# Patient Record
Sex: Female | Born: 1960 | Race: White | Hispanic: Yes | Marital: Married | Smoking: Former smoker
Health system: Southern US, Community
[De-identification: ages and names within clinical notes are randomized; demographics above are authoritative.]

## PROBLEM LIST (undated history)

## (undated) DIAGNOSIS — IMO0001 Reserved for inherently not codable concepts without codable children: Secondary | ICD-10-CM

## (undated) DIAGNOSIS — G4730 Sleep apnea, unspecified: Secondary | ICD-10-CM

## (undated) DIAGNOSIS — F329 Major depressive disorder, single episode, unspecified: Secondary | ICD-10-CM

## (undated) DIAGNOSIS — T753XXA Motion sickness, initial encounter: Secondary | ICD-10-CM

## (undated) DIAGNOSIS — K2270 Barrett's esophagus without dysplasia: Secondary | ICD-10-CM

## (undated) DIAGNOSIS — I10 Essential (primary) hypertension: Secondary | ICD-10-CM

## (undated) DIAGNOSIS — J189 Pneumonia, unspecified organism: Secondary | ICD-10-CM

## (undated) DIAGNOSIS — M1990 Unspecified osteoarthritis, unspecified site: Secondary | ICD-10-CM

## (undated) DIAGNOSIS — F32A Depression, unspecified: Secondary | ICD-10-CM

## (undated) DIAGNOSIS — C7A8 Other malignant neuroendocrine tumors: Secondary | ICD-10-CM

## (undated) DIAGNOSIS — Z464 Encounter for fitting and adjustment of orthodontic device: Secondary | ICD-10-CM

## (undated) DIAGNOSIS — C801 Malignant (primary) neoplasm, unspecified: Secondary | ICD-10-CM

## (undated) DIAGNOSIS — K219 Gastro-esophageal reflux disease without esophagitis: Secondary | ICD-10-CM

## (undated) DIAGNOSIS — J45909 Unspecified asthma, uncomplicated: Secondary | ICD-10-CM

## (undated) DIAGNOSIS — J449 Chronic obstructive pulmonary disease, unspecified: Secondary | ICD-10-CM

## (undated) HISTORY — PX: HAND SURGERY: SHX662

## (undated) HISTORY — DX: Other malignant neuroendocrine tumors: C7A.8

## (undated) HISTORY — PX: ABDOMINAL HYSTERECTOMY: SHX81

---

## 2012-07-19 HISTORY — PX: BOWEL RESECTION: SHX1257

## 2015-05-23 DIAGNOSIS — J189 Pneumonia, unspecified organism: Secondary | ICD-10-CM

## 2015-05-23 HISTORY — DX: Pneumonia, unspecified organism: J18.9

## 2017-01-16 DIAGNOSIS — K219 Gastro-esophageal reflux disease without esophagitis: Secondary | ICD-10-CM | POA: Insufficient documentation

## 2017-01-16 DIAGNOSIS — R413 Other amnesia: Secondary | ICD-10-CM | POA: Insufficient documentation

## 2017-01-16 DIAGNOSIS — F319 Bipolar disorder, unspecified: Secondary | ICD-10-CM | POA: Insufficient documentation

## 2017-01-16 DIAGNOSIS — B009 Herpesviral infection, unspecified: Secondary | ICD-10-CM | POA: Insufficient documentation

## 2017-01-16 DIAGNOSIS — F902 Attention-deficit hyperactivity disorder, combined type: Secondary | ICD-10-CM | POA: Insufficient documentation

## 2017-01-16 DIAGNOSIS — J449 Chronic obstructive pulmonary disease, unspecified: Secondary | ICD-10-CM | POA: Insufficient documentation

## 2017-01-16 DIAGNOSIS — I10 Essential (primary) hypertension: Secondary | ICD-10-CM | POA: Insufficient documentation

## 2017-01-16 DIAGNOSIS — R109 Unspecified abdominal pain: Secondary | ICD-10-CM

## 2017-01-16 DIAGNOSIS — C7A8 Other malignant neuroendocrine tumors: Secondary | ICD-10-CM

## 2017-01-16 DIAGNOSIS — G8929 Other chronic pain: Secondary | ICD-10-CM | POA: Insufficient documentation

## 2017-01-16 DIAGNOSIS — D3A8 Other benign neuroendocrine tumors: Secondary | ICD-10-CM | POA: Insufficient documentation

## 2017-01-16 HISTORY — DX: Other malignant neuroendocrine tumors: C7A.8

## 2017-01-18 DIAGNOSIS — R251 Tremor, unspecified: Secondary | ICD-10-CM | POA: Insufficient documentation

## 2017-01-18 DIAGNOSIS — R413 Other amnesia: Secondary | ICD-10-CM | POA: Insufficient documentation

## 2017-01-19 ENCOUNTER — Encounter: Payer: Self-pay | Admitting: Gastroenterology

## 2017-01-25 ENCOUNTER — Other Ambulatory Visit: Payer: Self-pay | Admitting: Neurology

## 2017-01-25 DIAGNOSIS — R413 Other amnesia: Secondary | ICD-10-CM

## 2017-02-06 ENCOUNTER — Ambulatory Visit
Admission: RE | Admit: 2017-02-06 | Discharge: 2017-02-06 | Disposition: A | Discharge status: Home / Self Care | Source: Ambulatory Visit | Attending: Neurology | Admitting: Neurology

## 2017-02-06 ENCOUNTER — Encounter: Payer: Self-pay | Admitting: Radiology

## 2017-02-06 DIAGNOSIS — E236 Other disorders of pituitary gland: Secondary | ICD-10-CM | POA: Insufficient documentation

## 2017-02-06 DIAGNOSIS — R413 Other amnesia: Secondary | ICD-10-CM

## 2017-02-06 DIAGNOSIS — R251 Tremor, unspecified: Secondary | ICD-10-CM | POA: Insufficient documentation

## 2017-03-01 ENCOUNTER — Other Ambulatory Visit: Payer: Self-pay

## 2017-03-01 ENCOUNTER — Encounter: Payer: Self-pay | Admitting: Gastroenterology

## 2017-03-01 ENCOUNTER — Encounter (INDEPENDENT_AMBULATORY_CARE_PROVIDER_SITE_OTHER): Payer: Self-pay

## 2017-03-01 ENCOUNTER — Encounter: Payer: Self-pay | Admitting: *Deleted

## 2017-03-01 ENCOUNTER — Ambulatory Visit (INDEPENDENT_AMBULATORY_CARE_PROVIDER_SITE_OTHER): Admitting: Gastroenterology

## 2017-03-01 DIAGNOSIS — R1084 Generalized abdominal pain: Secondary | ICD-10-CM | POA: Diagnosis not present

## 2017-03-01 DIAGNOSIS — K2270 Barrett's esophagus without dysplasia: Secondary | ICD-10-CM

## 2017-03-01 NOTE — Patient Instructions (Signed)
You are scheduled for a RUQ abdominal US at [facility], [place] on Tuesday, Oct 16th @ 8:45am. Please arrive to registration at 8:30am. You cannot have anything to eat or drink after midnight on Monday night.   If you need to reschedule this appointment for any reason, please contact central scheduling at [phone].

## 2017-03-01 NOTE — Progress Notes (Signed)
Gastroenterology Consultation  Referring Provider:     [name], MD Primary Care Physician:  [name], MD Primary Gastroenterologist:  Dr. Allen Norris     Reason for Consultation:     Abdominal pain        HPI:   Bailey Flores is a 56 y.o. y/o female referred for consultation & management of Abdominal pain by Dr. Ellison Hughs, Chrissie Noa, MD.  This patient comes in today to transfer care to me.  The patient had been followed in [place] or by a gastroenterologist in the past.  The patient has a history of Barrett's esophagus and has had an upper endoscopy in the past.  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  This information is obtained from her past medical records since she states that she does not remember all the things that are wrong with her.  She now reports that she has some pain right over the scar on the right side.  She states that this pain has been present chronically.  The incision is from her bile duct repair.  She also had an MRI a few years ago that showed possible sludge or stone in the bile duct that was remaining.  The patient does not recall ever following up with oncology.  She just keeps stating that her doctor told her that the tumor was slow growing and she didn't need anything further done with it.  Past Medical History:  Diagnosis Date  . Arthritis    hands  . Asthma   . Barrett's esophagus   . COPD (chronic obstructive pulmonary disease) ([place])   . Hypertension   . Motion sickness    reading in car    Past Surgical History:  Procedure Laterality Date  . ABDOMINAL HYSTERECTOMY    . BOWEL RESECTION  07/19/2012  . CESAREAN SECTION     x3  . HAND SURGERY Right     Prior to Admission medications   Medication Sig Start Date End Date Taking? Authorizing Provider  albuterol (PROVENTIL HFA;VENTOLIN HFA) 108 (90 Base) MCG/ACT inhaler Inhale into the lungs.    [provider]  buPROPion (WELLBUTRIN SR) 200  MG 12 hr tablet Take by mouth. 01/16/17   [provider]  ePHEDrine-GuaiFENesin 25-400 MG TABS Take by mouth.    [provider]  gabapentin (NEURONTIN) 300 MG capsule Take by mouth. 02/15/17 03/17/17  [provider]  ibuprofen (ADVIL,MOTRIN) 800 MG tablet Take by mouth. 01/16/17   [provider]  ipratropium-albuterol (DUONEB) 0.5-2.5 (3) MG/3ML SOLN Inhale into the lungs. 01/16/17 01/11/18  [provider]  lamoTRIgine (LAMICTAL) 100 MG tablet Take by mouth. 01/16/17   [provider]  lisdexamfetamine (VYVANSE) 30 MG capsule Take by mouth. 02/07/17 03/09/17  [provider]  lisdexamfetamine (VYVANSE) 70 MG capsule Take by mouth.    [provider]  losartan-hydrochlorothiazide Konrad Penta) 50-12.5 MG tablet Take by mouth. 01/16/17   [provider]  lubiprostone (AMITIZA) 24 MCG capsule Take by mouth. 01/16/17   [provider]  MAGNESIUM PO Take by mouth daily.    [provider]  Melatonin 10 MG TABS Take by mouth.    [provider]  MILK THISTLE PO Take by mouth daily.    [provider]  montelukast (SINGULAIR) 10 MG tablet Take by mouth. 01/16/17   [provider]  Multiple Vitamins-Minerals (CENTRUM SILVER PO) Take by mouth daily.    [provider]  omeprazole (PRILOSEC) 40 MG capsule Take by mouth. 01/16/17   [provider]  orlistat (ALLI) 60 MG capsule Take by mouth.    [provider]  valACYclovir (VALTREX) 1000 MG tablet Take by mouth.    [provider]    No family history on file.   Social History  Substance Use Topics  . Smoking status: Former Smoker    Quit date: 07/19/2012  . Smokeless tobacco: Never Used  . Alcohol use Yes     Comment: 2 drinks/mo    Allergies as of 03/01/2017 - Review Complete 03/01/2017  Allergen Reaction Noted  . Hydrocodone-acetaminophen Itching 01/16/2017    Review of Systems:    All  systems reviewed and negative except where noted in HPI.   Physical Exam:  There were no vitals taken for this visit. No LMP recorded. Patient has had a hysterectomy. Psych:  Alert and cooperative. Normal mood and affect. General:   Alert,  Well-developed, well-nourished, pleasant and cooperative in NAD Head:  Normocephalic and atraumatic. Eyes:  Sclera clear, no icterus.   Conjunctiva pink. Ears:  Normal auditory acuity. Nose:  No deformity, discharge, or lesions. Mouth:  No deformity or lesions,oropharynx pink & moist. Neck:  Supple; no masses or thyromegaly. Lungs:  Respirations even and unlabored.  Clear throughout to auscultation.   No wheezes, crackles, or rhonchi. No acute distress. Heart:  Regular rate and rhythm; no murmurs, clicks, rubs, or gallops. Abdomen:  Normal bowel sounds.  No bruits.  Soft, Positive tenderness with exacerbation of the tenderness with 1 finger palpation while flexing the abdominal wall muscles by raising the patient's legs above the exam table and non-distended without masses, hepatosplenomegaly or hernias noted.  No guarding or rebound tenderness.  Negative Carnett sign.   Rectal:  Deferred.  Msk:  Symmetrical without gross deformities.  Good, equal movement & strength bilaterally. Pulses:  Normal pulses noted. Extremities:  No clubbing or edema.  No cyanosis. Neurologic:  Alert and oriented x3;  grossly normal neurologically. Skin:  Intact without significant lesions or rashes.  No jaundice. Lymph Nodes:  No significant cervical adenopathy. Psych:  Alert and cooperative. Normal mood and affect.  Imaging Studies: Mr Brain Wo Contrast  Result Date: 02/06/2017 CLINICAL DATA:  Initial evaluation for worsening memory issues for past 4-5 years. EXAM: MRI HEAD WITHOUT CONTRAST TECHNIQUE: Multiplanar, multiecho pulse sequences of the brain and surrounding structures were obtained without intravenous contrast. COMPARISON:  None available. FINDINGS: Brain:  Cerebral volume within normal limits for age. No focal parenchymal signal abnormality identified. No abnormal foci of restricted diffusion to suggest acute or subacute ischemia. Gray-white matter differentiation well maintained. No encephalomalacia to suggest chronic infarction. No susceptibility artifact to suggest acute or chronic intracranial hemorrhage. No mass lesion, midline shift or mass effect. Ventricles normal size without evidence for hydrocephalus. No extra-axial fluid collection. Major dural sinuses grossly patent. Incidental note made of an empty sella. Suprasellar region normal. Midline structures intact and normal. Vascular: Major intracranial vascular flow voids well maintained. Skull and upper cervical spine: Craniocervical junction normal. Mild degenerate spondylolysis noted within the upper cervical spine without significant stenosis. Bone marrow signal intensity normal. No scalp soft tissue abnormality. Sinuses/Orbits: Globes oral soft tissues within normal limits. Paranasal sinuses are clear. No mastoid effusion. Inner ear structures normal. Other: None. IMPRESSION: 1. No acute intracranial abnormality. No findings to explain patient's symptoms identified. 2. Empty sella. While this finding is often incidental in nature and of no clinical significance, this  can also be seen in the setting of underlying idiopathic intracranial hypertension. 3. Otherwise normal brain MRI. Electronically Signed   By: Jeannine Boga M.D.   On: 02/06/2017 15:24    Assessment and Plan:   Bailey Flores is a 56 y.o. 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  The patient's abdominal pain is clearly musculoskeletal and affecting the muscles around her surgical site.  The patient will be set up for an EGD because of her history of Barrett's.  The patient will continue her omeprazole.  She will also be set up with oncology for any further follow-up  that as needed for her carcinoid. The patient will also be set up for a right upper quadrant ultrasound because of the history of bile duct cancer. The patient has been explained the plan and agrees with it.  [name], MD. Marval Regal   Note: This dictation was prepared with Dragon dictation along with smaller phrase technology. Any transcriptional errors that result from this process are unintentional.

## 2017-03-02 NOTE — Discharge Instructions (Signed)
General Anesthesia, Adult, Care After °These instructions provide you with information about caring for yourself after your procedure. Your health care provider may also give you more specific instructions. Your treatment has been planned according to current medical practices, but problems sometimes occur. Call your health care provider if you have any problems or questions after your procedure. °What can I expect after the procedure? °After the procedure, it is common to have: °· Vomiting. °· A sore throat. °· Mental slowness. ° °It is common to feel: °· Nauseous. °· Cold or shivery. °· Sleepy. °· Tired. °· Sore or achy, even in parts of your body where you did not have surgery. ° °Follow these instructions at home: °For at least 24 hours after the procedure: °· Do not: °? Participate in activities where you could fall or become injured. °? Drive. °? Use heavy machinery. °? Drink alcohol. °? Take sleeping pills or medicines that cause drowsiness. °? Make important decisions or sign legal documents. °? Take care of children on your own. °· Rest. °Eating and drinking °· If you vomit, drink water, juice, or soup when you can drink without vomiting. °· Drink enough fluid to keep your urine clear or pale yellow. °· Make sure you have little or no nausea before eating solid foods. °· Follow the diet recommended by your health care provider. °General instructions °· Have a responsible adult stay with you until you are awake and alert. °· Return to your normal activities as told by your health care provider. Ask your health care provider what activities are safe for you. °· Take over-the-counter and prescription medicines only as told by your health care provider. °· If you smoke, do not smoke without supervision. °· Keep all follow-up visits as told by your health care provider. This is important. °Contact a health care provider if: °· You continue to have nausea or vomiting at home, and medicines are not helpful. °· You  cannot drink fluids or start eating again. °· You cannot urinate after 8-12 hours. °· You develop a skin rash. °· You have fever. °· You have increasing redness at the site of your procedure. °Get help right away if: °· You have difficulty breathing. °· You have chest pain. °· You have unexpected bleeding. °· You feel that you are having a life-threatening or urgent problem. °This information is not intended to replace advice given to you by your health care provider. Make sure you discuss any questions you have with your health care provider. °Document Released: 08/14/2000 Document Revised: 10/11/2015 Document Reviewed: 04/22/2015 °Elsevier Interactive Patient Education © 2018 Else[employer] Inc. ° °

## 2017-03-05 ENCOUNTER — Encounter
Admission: RE | Disposition: A | Discharge status: Home / Self Care | Payer: Self-pay | Source: Ambulatory Visit | Attending: Gastroenterology

## 2017-03-05 ENCOUNTER — Ambulatory Visit
Admission: RE | Admit: 2017-03-05 | Discharge: 2017-03-05 | Disposition: A | Discharge status: Home / Self Care | Source: Ambulatory Visit | Attending: Gastroenterology | Admitting: Gastroenterology

## 2017-03-05 ENCOUNTER — Ambulatory Visit: Admitting: Anesthesiology

## 2017-03-05 DIAGNOSIS — R12 Heartburn: Secondary | ICD-10-CM

## 2017-03-05 DIAGNOSIS — Z79899 Other long term (current) drug therapy: Secondary | ICD-10-CM | POA: Diagnosis not present

## 2017-03-05 DIAGNOSIS — Z87891 Personal history of nicotine dependence: Secondary | ICD-10-CM | POA: Insufficient documentation

## 2017-03-05 DIAGNOSIS — J449 Chronic obstructive pulmonary disease, unspecified: Secondary | ICD-10-CM | POA: Diagnosis not present

## 2017-03-05 DIAGNOSIS — K2940 Chronic atrophic gastritis without bleeding: Secondary | ICD-10-CM | POA: Insufficient documentation

## 2017-03-05 DIAGNOSIS — I10 Essential (primary) hypertension: Secondary | ICD-10-CM | POA: Insufficient documentation

## 2017-03-05 DIAGNOSIS — K2270 Barrett's esophagus without dysplasia: Secondary | ICD-10-CM

## 2017-03-05 HISTORY — DX: Chronic obstructive pulmonary disease, unspecified: J44.9

## 2017-03-05 HISTORY — DX: Unspecified osteoarthritis, unspecified site: M19.90

## 2017-03-05 HISTORY — DX: Motion sickness, initial encounter: T75.3XXA

## 2017-03-05 HISTORY — DX: Essential (primary) hypertension: I10

## 2017-03-05 HISTORY — DX: Barrett's esophagus without dysplasia: K22.70

## 2017-03-05 HISTORY — PX: ESOPHAGOGASTRODUODENOSCOPY (EGD) WITH PROPOFOL: SHX5813

## 2017-03-05 HISTORY — DX: Unspecified asthma, uncomplicated: J45.909

## 2017-03-05 SURGERY — ESOPHAGOGASTRODUODENOSCOPY (EGD) WITH PROPOFOL
Anesthesia: Monitor Anesthesia Care | Wound class: Clean Contaminated

## 2017-03-05 MED ORDER — LACTATED RINGERS IV SOLN: INTRAVENOUS | Status: DC

## 2017-03-05 MED ORDER — LIDOCAINE HCL (CARDIAC) 20 MG/ML IV SOLN
INTRAVENOUS | Status: DC | PRN
  Administered 2017-03-05: 50 mg via INTRAVENOUS

## 2017-03-05 MED ORDER — LACTATED RINGERS IV SOLN
INTRAVENOUS | Status: DC
  Administered 2017-03-05: 07:00:00 via INTRAVENOUS

## 2017-03-05 MED ORDER — SODIUM CHLORIDE 0.9 % IV SOLN: INTRAVENOUS | Status: DC

## 2017-03-05 MED ORDER — GLYCOPYRROLATE 0.2 MG/ML IJ SOLN
INTRAMUSCULAR | Status: DC | PRN
Start: 2017-03-05 — End: 2017-03-05
  Administered 2017-03-05: 0.2 mg via INTRAVENOUS

## 2017-03-05 MED ORDER — PROPOFOL 10 MG/ML IV BOLUS
INTRAVENOUS | Status: DC | PRN
  Administered 2017-03-05 (×2): 50 mg via INTRAVENOUS

## 2017-03-05 MED ORDER — SIMETHICONE 40 MG/0.6ML PO SUSP
ORAL | Status: DC | PRN
  Administered 2017-03-05: 08:00:00

## 2017-03-05 MED ORDER — ACETAMINOPHEN 160 MG/5ML PO SOLN: 325.0000 mg | Freq: Once | ORAL | Status: DC

## 2017-03-05 MED ORDER — ACETAMINOPHEN 325 MG PO TABS: 325.0000 mg | ORAL_TABLET | Freq: Once | ORAL | Status: DC

## 2017-03-05 SURGICAL SUPPLY — 32 items
BALLN DILATOR 10-12 8 (BALLOONS)
BALLN DILATOR 12-15 8 (BALLOONS)
BALLN DILATOR 15-18 8 (BALLOONS)
BALLN DILATOR CRE 0-12 8 (BALLOONS)
BALLN DILATOR ESOPH 8 10 CRE (MISCELLANEOUS) IMPLANT
BALLOON DILATOR 12-15 8 (BALLOONS) IMPLANT
BALLOON DILATOR 15-18 8 (BALLOONS) IMPLANT
BALLOON DILATOR CRE 0-12 8 (BALLOONS) IMPLANT
BLOCK BITE 60FR ADLT L/F GRN (MISCELLANEOUS) ×2 IMPLANT
CANISTER SUCT 1200ML W/VALVE (MISCELLANEOUS) ×2 IMPLANT
CLIP HMST 235XBRD CATH ROT (MISCELLANEOUS) IMPLANT
CLIP RESOLUTION 360 11X235 (MISCELLANEOUS)
FCP ESCP3.2XJMB 240X2.8X (MISCELLANEOUS)
FORCEPS BIOP RAD 4 LRG CAP 4 (CUTTING FORCEPS) ×2 IMPLANT
FORCEPS BIOP RJ4 240 W/NDL (MISCELLANEOUS)
FORCEPS ESCP3.2XJMB 240X2.8X (MISCELLANEOUS) IMPLANT
GOWN CVR UNV OPN BCK APRN NK (MISCELLANEOUS) ×2 IMPLANT
GOWN ISOL THUMB LOOP REG UNIV (MISCELLANEOUS) ×2
INJECTOR VARIJECT VIN23 (MISCELLANEOUS) IMPLANT
KIT DEFENDO VALVE AND CONN (KITS) IMPLANT
KIT ENDO PROCEDURE OLY (KITS) ×2 IMPLANT
MARKER SPOT ENDO TATTOO 5ML (MISCELLANEOUS) IMPLANT
PAD GROUND ADULT SPLIT (MISCELLANEOUS) IMPLANT
RETRIEVER NET PLAT FOOD (MISCELLANEOUS) IMPLANT
SNARE SHORT THROW 13M SML OVAL (MISCELLANEOUS) IMPLANT
SNARE SHORT THROW 30M LRG OVAL (MISCELLANEOUS) IMPLANT
SPOT EX ENDOSCOPIC TATTOO (MISCELLANEOUS)
SYR INFLATION 60ML (SYRINGE) IMPLANT
TRAP ETRAP POLY (MISCELLANEOUS) IMPLANT
VARIJECT INJECTOR VIN23 (MISCELLANEOUS)
WATER STERILE IRR 250ML POUR (IV SOLUTION) ×2 IMPLANT
WIRE CRE 18-20MM 8CM F G (MISCELLANEOUS) IMPLANT

## 2017-03-05 NOTE — Anesthesia Postprocedure Evaluation (Signed)
Anesthesia Post Note  Patient: Bailey Flores  Procedure(s) Performed: ESOPHAGOGASTRODUODENOSCOPY (EGD) WITH PROPOFOL (N/A )  Patient location during evaluation: PACU Anesthesia Type: MAC Level of consciousness: awake and alert and oriented Pain management: satisfactory to patient Vital Signs Assessment: post-procedure vital signs reviewed and stable Respiratory status: spontaneous breathing, nonlabored ventilation and respiratory function stable Cardiovascular status: blood pressure returned to baseline and stable Postop Assessment: Adequate PO intake and No signs of nausea or vomiting Anesthetic complications: no    Raliegh Ip

## 2017-03-05 NOTE — Op Note (Signed)
[facility] Gastroenterology Patient Name: Bailey Flores Procedure Date: 03/05/2017 7:49 AM MRN: [id number] Account #: [id number] Date of Birth: 10/12/1960 Admit Type: Outpatient Age: 56 Room: [facility] OR ROOM 01 Gender: Female Note Status: Finalized Procedure:            Upper GI endoscopy Indications:          Heartburn Providers:            [name] MD, MD Referring MD:         [name] (Referring MD) Medicines:            Propofol per Anesthesia Complications:        No immediate complications. Procedure:            Pre-Anesthesia Assessment:                       - Prior to the procedure, a History and Physical was                        performed, and patient medications and allergies were                        reviewed. The patient's tolerance of previous                        anesthesia was also reviewed. The risks and benefits of                        the procedure and the sedation options and risks were                        discussed with the patient. All questions were                        answered, and informed consent was obtained. Prior                        Anticoagulants: The patient has taken no previous                        anticoagulant or antiplatelet agents. ASA Grade                        Assessment: II - A patient with mild systemic disease.                        After reviewing the risks and benefits, the patient was                        deemed in satisfactory condition to undergo the                        procedure.                       After obtaining informed consent, the endoscope was                        passed under direct vision. Throughout the procedure,  the patient's blood pressure, pulse, and oxygen                        saturations were monitored continuously. The Olympus                        190 Endoscope [id number]) was introduced through the                        mouth,  and advanced to the second part of duodenum. The                        upper GI endoscopy was accomplished without difficulty.                        The patient tolerated the procedure well. Findings:      The examined esophagus was normal.      A large amount of food (residue) was found in the gastric body.      Localized moderate inflammation characterized by erythema was found in       the gastric antrum. Biopsies were taken with a cold forceps for       histology.      The examined duodenum was normal. Impression:           - Normal esophagus.                       - A large amount of food (residue) in the stomach.                       - Gastritis. Biopsied.                       - Normal examined duodenum. Recommendation:       - Discharge patient to home.                       - Resume previous diet.                       - Continue present medications. Procedure Code(s):    --- Professional ---                       [phone], Esophagogastroduodenoscopy, flexible, transoral;                        with biopsy, single or multiple Diagnosis Code(s):    --- Professional ---                       R12, Heartburn                       K29.70, Gastritis, unspecified, without bleeding CPT copyright 2016 American Medical Association. All rights reserved. The codes documented in this report are preliminary and upon coder review may  be revised to meet current compliance requirements. [name] MD, MD 03/05/2017 7:59:24 AM This report has been signed electronically. Number of Addenda: 0 Note Initiated On: 03/05/2017 7:49 AM Total Procedure Duration: 0 hours 2 minutes 33 seconds       [facility]

## 2017-03-05 NOTE — Anesthesia Preprocedure Evaluation (Addendum)
Anesthesia Evaluation  Patient identified by MRN, date of birth, ID band Patient awake    Reviewed: Allergy & Precautions, H&P , NPO status , Patient's Chart, lab work & pertinent test results  Airway Mallampati: II  TM Distance: >3 FB Neck ROM: full    Dental no notable dental hx.    Pulmonary asthma , COPD, former smoker,    Pulmonary exam normal breath sounds clear to auscultation       Cardiovascular hypertension, Normal cardiovascular exam Rhythm:regular Rate:Normal     Neuro/Psych PSYCHIATRIC DISORDERS    GI/Hepatic GERD  ,  Endo/Other    Renal/GU      Musculoskeletal   Abdominal   Peds  Hematology   Anesthesia Other Findings   Reproductive/Obstetrics                             Anesthesia Physical Anesthesia Plan  ASA: III  Anesthesia Plan: General   Post-op Pain Management:    Induction:   PONV Risk Score and Plan: 2 and Midazolam  Airway Management Planned:   Additional Equipment:   Intra-op Plan:   Post-operative Plan:   Informed Consent: I have reviewed the patients History and Physical, chart, labs and discussed the procedure including the risks, benefits and alternatives for the proposed anesthesia with the patient or authorized representative who has indicated his/her understanding and acceptance.     Plan Discussed with: CRNA  Anesthesia Plan Comments:        Anesthesia Quick Evaluation

## 2017-03-05 NOTE — Transfer of Care (Signed)
Immediate Anesthesia Transfer of Care Note  Patient: Bailey Flores  Procedure(s) Performed: ESOPHAGOGASTRODUODENOSCOPY (EGD) WITH PROPOFOL (N/A )  Patient Location: PACU  Anesthesia Type: MAC  Level of Consciousness: awake, alert  and patient cooperative  Airway and Oxygen Therapy: Patient Spontanous Breathing and Patient connected to supplemental oxygen  Post-op Assessment: Post-op Vital signs reviewed, Patient's Cardiovascular Status Stable, Respiratory Function Stable, Patent Airway and No signs of Nausea or vomiting  Post-op Vital Signs: Reviewed and stable  Complications: No apparent anesthesia complications

## 2017-03-05 NOTE — H&P (Signed)
Bailey Lame, MD [address] [place], [place] [address] Phone:[phone] Fax : [phone]  Primary Care Physician:  [name], MD Primary Gastroenterologist:  Dr. Allen Norris  Pre-Procedure History & Physical: HPI:  Bailey Flores is a 56 y.o. female is here for an endoscopy.   Past Medical History:  Diagnosis Date  . Arthritis    hands  . Asthma   . Barrett's esophagus   . COPD (chronic obstructive pulmonary disease) ([place])   . Hypertension   . Motion sickness    reading in car    Past Surgical History:  Procedure Laterality Date  . ABDOMINAL HYSTERECTOMY    . BOWEL RESECTION  07/19/2012  . CESAREAN SECTION     x3  . HAND SURGERY Right     Prior to Admission medications   Medication Sig Start Date End Date Taking? Authorizing Provider  albuterol (PROVENTIL HFA;VENTOLIN HFA) 108 (90 Base) MCG/ACT inhaler Inhale into the lungs.   Yes [provider]  buPROPion (WELLBUTRIN SR) 200 MG 12 hr tablet Take by mouth. 01/16/17  Yes [provider]  gabapentin (NEURONTIN) 300 MG capsule Take by mouth. 02/15/17 03/17/17 Yes [provider]  ibuprofen (ADVIL,MOTRIN) 800 MG tablet Take by mouth. 01/16/17  Yes [provider]  ipratropium-albuterol (DUONEB) 0.5-2.5 (3) MG/3ML SOLN Inhale into the lungs. 01/16/17 01/11/18 Yes [provider]  lamoTRIgine (LAMICTAL) 100 MG tablet Take by mouth. 01/16/17  Yes [provider]  lisdexamfetamine (VYVANSE) 30 MG capsule Take by mouth. 02/07/17 03/09/17 Yes [provider]  losartan-hydrochlorothiazide Konrad Penta) 50-12.5 MG tablet Take by mouth. 01/16/17  Yes [provider]  lubiprostone (AMITIZA) 24 MCG capsule Take by mouth. 01/16/17  Yes [provider]  MAGNESIUM PO Take by mouth daily.   Yes [provider]  Melatonin 10 MG TABS Take by mouth.   Yes [provider]  MILK THISTLE PO Take by mouth daily.   Yes [provider]  montelukast (SINGULAIR) 10 MG tablet Take by mouth. 01/16/17  Yes [provider]  Multiple Vitamins-Minerals (CENTRUM SILVER PO) Take by mouth daily.   Yes [provider]  omeprazole (PRILOSEC) 40 MG capsule Take by mouth. 01/16/17  Yes [provider]  orlistat (ALLI) 60 MG capsule Take by mouth.   Yes [provider]  valACYclovir (VALTREX) 1000 MG tablet Take by mouth.   Yes [provider]  ePHEDrine-GuaiFENesin 25-400 MG TABS Take by mouth.    [provider]  lisdexamfetamine (VYVANSE) 70 MG capsule Take by mouth.    [provider]    Allergies as of 03/01/2017 - Review Complete 03/01/2017  Allergen Reaction Noted  . Hydrocodone-acetaminophen Itching 01/16/2017    History reviewed. No pertinent family history.  Social History   Social History  . Marital status: Married    Spouse name: N/A  . Number of children: N/A  . Years of education: N/A   Occupational History  . Not on file.   Social History Main Topics  . Smoking status: Former Smoker    Quit date: 07/19/2012  . Smokeless tobacco: Never Used  . Alcohol use Yes     Comment: 2 drinks/mo  . Drug use: Unknown  . Sexual activity: Not on file   Other Topics Concern  . Not on file   Social History Narrative  . No narrative on file    Review of Systems: See HPI, otherwise negative ROS  Physical Exam: BP 123/82   Pulse Marland Kitchen)  103   Temp 98.1 F (36.7 C) (Temporal)   Resp 16   Ht 5' (1.524 m)   Wt 125 lb (56.7 kg)   SpO2 97%   BMI 24.41 kg/m  General:   Alert,  pleasant and cooperative in NAD Head:  Normocephalic and atraumatic. Neck:  Supple; no masses or thyromegaly. Lungs:  Clear throughout to auscultation.    Heart:  Regular rate and rhythm. Abdomen:  Soft, nontender and nondistended. Normal bowel sounds, without guarding, and without rebound.   Neurologic:  Alert and  oriented x4;  grossly normal  neurologically.  Impression/Plan: Bailey Flores is here for an endoscopy to be performed for barrett's  Risks, benefits, limitations, and alternatives regarding  endoscopy have been reviewed with the patient.  Questions have been answered.  All parties agreeable.   Bailey Lame, MD  03/05/2017, 7:34 AM

## 2017-03-05 NOTE — Anesthesia Procedure Notes (Signed)
Performed by: Crysta Gulick Pre-anesthesia Checklist: Patient identified, Emergency Drugs available, Suction available, Timeout performed and Patient being monitored Patient Re-evaluated:Patient Re-evaluated prior to induction Oxygen Delivery Method: Nasal cannula Placement Confirmation: positive ETCO2       

## 2017-03-06 ENCOUNTER — Ambulatory Visit
Admission: RE | Admit: 2017-03-06 | Discharge: 2017-03-06 | Disposition: A | Discharge status: Home / Self Care | Source: Ambulatory Visit | Attending: Gastroenterology | Admitting: Gastroenterology

## 2017-03-06 ENCOUNTER — Other Ambulatory Visit: Payer: Self-pay

## 2017-03-06 ENCOUNTER — Encounter: Payer: Self-pay | Admitting: Gastroenterology

## 2017-03-06 DIAGNOSIS — K760 Fatty (change of) liver, not elsewhere classified: Secondary | ICD-10-CM | POA: Insufficient documentation

## 2017-03-06 DIAGNOSIS — R1084 Generalized abdominal pain: Secondary | ICD-10-CM | POA: Insufficient documentation

## 2017-03-06 DIAGNOSIS — Z9049 Acquired absence of other specified parts of digestive tract: Secondary | ICD-10-CM | POA: Insufficient documentation

## 2017-03-06 DIAGNOSIS — Z8509 Personal history of malignant neoplasm of other digestive organs: Secondary | ICD-10-CM

## 2017-03-09 ENCOUNTER — Telehealth: Payer: Self-pay

## 2017-03-09 NOTE — Telephone Encounter (Signed)
-----   Message from [name], MD sent at 03/07/2017  8:20 AM EDT ----- Please have the patient come in for a follow up.

## 2017-03-09 NOTE — Telephone Encounter (Signed)
-----   Message from [name], MD sent at 03/07/2017  8:16 AM EDT ----- The patient know that her ultrasound showed her to have fatty liver without any bile duct abnormalities.

## 2017-03-09 NOTE — Telephone Encounter (Signed)
Pt notified of [place] results. She has also been scheduled for a follow up appt to discuss EGD results on Monday, Oct 29th.

## 2017-03-14 ENCOUNTER — Inpatient Hospital Stay: Attending: Hematology and Oncology | Admitting: Hematology and Oncology

## 2017-03-14 ENCOUNTER — Encounter: Payer: Self-pay | Admitting: Hematology and Oncology

## 2017-03-14 ENCOUNTER — Other Ambulatory Visit: Payer: Self-pay | Admitting: *Deleted

## 2017-03-14 VITALS — BP 129/84 | HR 87 | Temp 98.3°F | Resp 20 | Ht 60.0 in | Wt 126.8 lb

## 2017-03-14 DIAGNOSIS — I10 Essential (primary) hypertension: Secondary | ICD-10-CM | POA: Insufficient documentation

## 2017-03-14 DIAGNOSIS — K760 Fatty (change of) liver, not elsewhere classified: Secondary | ICD-10-CM | POA: Insufficient documentation

## 2017-03-14 DIAGNOSIS — K2970 Gastritis, unspecified, without bleeding: Secondary | ICD-10-CM

## 2017-03-14 DIAGNOSIS — M129 Arthropathy, unspecified: Secondary | ICD-10-CM | POA: Diagnosis not present

## 2017-03-14 DIAGNOSIS — K5900 Constipation, unspecified: Secondary | ICD-10-CM

## 2017-03-14 DIAGNOSIS — J449 Chronic obstructive pulmonary disease, unspecified: Secondary | ICD-10-CM | POA: Insufficient documentation

## 2017-03-14 DIAGNOSIS — K2270 Barrett's esophagus without dysplasia: Secondary | ICD-10-CM

## 2017-03-14 DIAGNOSIS — Z79899 Other long term (current) drug therapy: Secondary | ICD-10-CM | POA: Insufficient documentation

## 2017-03-14 DIAGNOSIS — Z87891 Personal history of nicotine dependence: Secondary | ICD-10-CM | POA: Insufficient documentation

## 2017-03-14 DIAGNOSIS — R1011 Right upper quadrant pain: Secondary | ICD-10-CM

## 2017-03-14 DIAGNOSIS — G8929 Other chronic pain: Secondary | ICD-10-CM | POA: Diagnosis not present

## 2017-03-14 DIAGNOSIS — R0602 Shortness of breath: Secondary | ICD-10-CM | POA: Diagnosis not present

## 2017-03-14 DIAGNOSIS — Z9049 Acquired absence of other specified parts of digestive tract: Secondary | ICD-10-CM

## 2017-03-14 DIAGNOSIS — C7A8 Other malignant neuroendocrine tumors: Secondary | ICD-10-CM | POA: Insufficient documentation

## 2017-03-14 NOTE — Progress Notes (Signed)
Patient here today as new evaluation regarding history of bile duct cancer.  Referred by Dr. Dorann Ou.

## 2017-03-14 NOTE — Progress Notes (Signed)
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 Leafy Half for assessment and management.  HPI: The patient was diagnosed with a "tumor of the bile duct" in 03/2012.  She presented with increased daily nausea, dark urine, and increased liver function tests.  She denied any diarrhea or flushing.  She underwent resection in 06/2012 at the [facility] in [place].  She was told that she had a rare, "slow growing neoplasm".  She was seen by Dr. Helen Hashimoto at the [facility].  She did not require chemotherapy.  She has had intermittent follow-up imaging with no evidence of recurrence.  She was last followed by Dr Janit Bern in [place], [place].  She was told that they "got everything out".   She describes constant pain following her surgery.  Liver MRI in 2016 was negative.  She underwent appendectomy in 09/2014.  The patient was seen by Dr. [name] on 03/01/2017 for abdominal pain. Pain was present over the scar on her right side.  She had chronic abdominal pain felt to be musculoskeletal in the area around her surgical site.  Abdominal ultrasound on 03/06/2017 revealed fatty infiltration of the liver.  She is status post cholecystectomy with no biliary distention.  EGD on 03/05/2017 revealed a normal esophagus.  There was a large amount of food (residue) in the stomach.  There was gastritis. 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  There was no dysplasia or Helicobacter pylori.  Duodenum was normal.  Symptomatically, she feels "ok".  She notes "chronic pain" with sensitivity at the surgical site.  She denies any nausea, vomiting, melena or hematochezia.  She has "constipation unless I take something".  She denies any flushing.  She has chronic shortness of breath from  COPD.   Past Medical History:  Diagnosis Date  . Arthritis    hands  . Asthma   . Barrett's esophagus   . COPD (chronic obstructive pulmonary disease) ([place])   . Hypertension   . Motion sickness    reading in car    Past Surgical History:  Procedure Laterality Date  . ABDOMINAL HYSTERECTOMY    . BOWEL RESECTION  07/19/2012  . CESAREAN SECTION     x3  . ESOPHAGOGASTRODUODENOSCOPY (EGD) WITH PROPOFOL N/A 03/05/2017   Procedure: ESOPHAGOGASTRODUODENOSCOPY (EGD) WITH PROPOFOL;  Surgeon: [name], MD;  Location: [facility];  Service: Endoscopy;  Laterality: N/A;  . HAND SURGERY Right     No family history on file.  Social History:  reports that she quit smoking about 4 years ago. She smoked 2.00 packs per day. She has never used smokeless tobacco. She reports that she drinks alcohol. She reports that she does not use drugs.  She smoked 1 1/2 - 2 packs/day from the 14 (age 68) to 06/2012.  When she was younger, she only smoked 1/2 pack per day.  She is from [place].  She grew up in [place].  She previously lived in [facility], but sought medical attention in [facility] then [place].  She moved to [place] in 07/2016.  She lives in [place].  The patient is alone today.  Allergies:  Allergies  Allergen Reactions  . Hydrocodone-Acetaminophen Itching    Current Medications: Current Outpatient Prescriptions  Medication Sig Dispense Refill  . albuterol (PROVENTIL HFA;VENTOLIN  HFA) 108 (90 Base) MCG/ACT inhaler Inhale into the lungs.    Marland Kitchen buPROPion (WELLBUTRIN SR) 200 MG 12 hr tablet Take by mouth.    Marland Kitchen ePHEDrine-GuaiFENesin 25-400 MG TABS Take by mouth.    . Fluticasone-Salmeterol (ADVAIR DISKUS) 250-50 MCG/DOSE AEPB Inhale into the lungs.    . gabapentin (NEURONTIN) 300 MG capsule Take by mouth.    Marland Kitchen ibuprofen (ADVIL,MOTRIN) 800 MG tablet Take by mouth.    Marland Kitchen ipratropium-albuterol (DUONEB) 0.5-2.5 (3) MG/3ML SOLN Inhale into the lungs.    .  lamoTRIgine (LAMICTAL) 100 MG tablet Take by mouth.    . lisdexamfetamine (VYVANSE) 70 MG capsule Take by mouth.    . losartan-hydrochlorothiazide (HYZAAR) 50-12.5 MG tablet Take by mouth.    . lubiprostone (AMITIZA) 24 MCG capsule Take by mouth.    Marland Kitchen MAGNESIUM PO Take by mouth daily.    . Melatonin 10 MG TABS Take by mouth.    Marland Kitchen MILK THISTLE PO Take by mouth daily.    . montelukast (SINGULAIR) 10 MG tablet Take by mouth.    . Multiple Vitamins-Minerals (CENTRUM SILVER PO) Take by mouth daily.    Marland Kitchen omeprazole (PRILOSEC) 40 MG capsule Take by mouth.    . orlistat (ALLI) 60 MG capsule Take by mouth.    . sertraline (ZOLOFT) 50 MG tablet Take 50 mg by mouth daily.    . Tiotropium Bromide Monohydrate (SPIRIVA RESPIMAT) 1.25 MCG/ACT AERS Inhale into the lungs.    . valACYclovir (VALTREX) 1000 MG tablet Take by mouth.    . lisdexamfetamine (VYVANSE) 30 MG capsule Take by mouth.     No current facility-administered medications for this visit.     Review of Systems:  GENERAL:  Feels "ok".  No fevers, sweats or weight loss. PERFORMANCE STATUS (ECOG):  1 HEENT:  No visual changes, runny nose, sore throat, mouth sores or tenderness. Lungs: Shortness of breath secondary to COPD.  No cough.  No hemoptysis. Cardiac:  No chest pain, palpitations, orthopnea, or PND. Breasts:  No mammogram in 2 years. GI:  Some nausea.  Constipation unless takes magnesium or Amitiza.  No vomiting, diarrhea, melena or hematochezia.  Last colonoscopy about 9 years ago. GU:  No urgency, frequency, dysuria, or hematuria. Musculoskeletal:  No back pain.  Arthritis in hands and right shoulder.  No muscle tenderness. Extremities:  No pain or swelling. Skin:  No rashes or skin changes. Neuro:  Some memory issues (word finding).  Sometimes loses balance.  No headache, numbness or weakness, or coordination issues. Endocrine:  No diabetes, thyroid issues, hot flashes or night sweats. Psych:  No mood changes, depression or  anxiety. Pain:  Chronic pain associated with her RUQ incision. Review of systems:  All other systems reviewed and found to be negative.  Physical Exam: Blood pressure 129/84, pulse 87, temperature 98.3 F (36.8 C), temperature source Tympanic, resp. rate 20, height 5' (1.524 m), weight 126 lb 12.2 oz (57.5 kg). GENERAL:  Well developed, well nourished, woman sitting comfortably in the exam room in no acute distress. MENTAL STATUS:  Alert and oriented to person, place and time. HEAD:  Long black hair.  Normocephalic, atraumatic, face symmetric, no Cushingoid features. EYES:  Glasses.  Brown eyes.  Pupils equal round and reactive to light and accomodation.  No conjunctivitis or scleral icterus. ENT:  Oropharynx clear without lesion.  Tongue normal. Mucous membranes moist.  RESPIRATORY:  Clear to auscultation without rales, wheezes or rhonchi. CARDIOVASCULAR:  Regular rate and rhythm without murmur, rub or  gallop. ABDOMEN:  Well healed RUQ incision.  Soft, tender to minimal palpation over scar.  No guarding or rebound tenderness.  Active bowel sounds and no hepatosplenomegaly.  No masses. SKIN:  No rashes, ulcers or lesions. EXTREMITIES: No edema, no skin discoloration or tenderness.  No palpable cords. LYMPH NODES: No palpable cervical, supraclavicular, axillary or inguinal adenopathy  NEUROLOGICAL: Unremarkable. PSYCH:  Appropriate.   No visits with results within 3 Day(s) from this visit.  Latest known visit with results is:  No results found for any previous visit.    Assessment:  Bailey Flores is a 56 y.o. female with a history of bile duct tumor s/p resection in 06/2012 while living in [place].  She was told that she had a "rare slow growing tumor" that was completely resected.  No pathology is available.  Abdominal ultrasound on 03/06/2017 revealed fatty infiltration of the liver.  She is status post cholecystectomy with no biliary distention.  EGD on 03/05/2017 revealed a normal  esophagus.  There was a large amount of food (residue) in the stomach.  There was gastritis. 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  There was no dysplasia or Helicobacter pylori.  Duodenum was normal.  Last colonoscopy was < 10 years ago.  She has not had a mammogram in > 2 years.    She has a > 50 pack year smoking history (stopped smoking 2014).  Symptomatically, she has had chronic post-operative RUQ pain.  The skin overlying her incision is sensitive to touch.  She denies any flushing or diarrhea.  Exam reveals no hepatosplenomegaly.  Plan: 1.  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  Discuss obtaining outside records to confirm diagnosis.  Discuss natural history and planned follow-up.  As patient > 4 years since diagnosis, anticipate imaging as needed. 2.  Release of information for records in [place] (Dr. Oliva Bustard Nygen [phone] or [phone] and Dr. Helen Hashimoto [phone]). 3.  Discuss mammogram yearly and colonoscopy every 10 years. 4.  Schedule bilateral screening mammogram. 5.  Anticipate referral to low dose chest CT program at next visit secondary to smoking history. 6.  RTC in 1 month (outside records available) for MD assessment and labs (CBC with diff, CMP, +/- chromogranin A).   Lequita Asal, MD  03/14/2017, 2:05 PM

## 2017-03-19 ENCOUNTER — Other Ambulatory Visit: Payer: Self-pay

## 2017-03-19 ENCOUNTER — Encounter: Payer: Self-pay | Admitting: Gastroenterology

## 2017-03-19 ENCOUNTER — Ambulatory Visit (INDEPENDENT_AMBULATORY_CARE_PROVIDER_SITE_OTHER): Admitting: Gastroenterology

## 2017-03-19 VITALS — BP 140/76 | HR 105 | Temp 98.9°F | Ht 60.0 in | Wt 129.0 lb

## 2017-03-19 DIAGNOSIS — K31A0 Gastric intestinal metaplasia, unspecified: Secondary | ICD-10-CM

## 2017-03-19 DIAGNOSIS — K3189 Other diseases of stomach and duodenum: Secondary | ICD-10-CM

## 2017-03-19 NOTE — Progress Notes (Signed)
Primary Care Physician: [name], MD  Primary Gastroenterologist:  Dr. [name]  Chief Complaint  Patient presents with  . Follow up EGD results    HPI: Bailey Flores is a 56 y.o. female here for follow-up after her EGD.  The patient came with the report of having Barrett esophagus although no Barrett's esophagus was found.  The patient did have intestinal metaplasia found on her gastric antral biopsies where gastritis was seen. The patient also had retained food in her stomach at the time of the endoscopy raising the suspicion for possible gastroparesis.  Current Outpatient Prescriptions  Medication Sig Dispense Refill  . albuterol (PROVENTIL HFA;VENTOLIN HFA) 108 (90 Base) MCG/ACT inhaler Inhale into the lungs.    Marland Kitchen buPROPion (WELLBUTRIN SR) 200 MG 12 hr tablet Take by mouth.    Marland Kitchen ePHEDrine-GuaiFENesin 25-400 MG TABS Take by mouth.    . Fluticasone-Salmeterol (ADVAIR DISKUS) 250-50 MCG/DOSE AEPB Inhale into the lungs.    Marland Kitchen ibuprofen (ADVIL,MOTRIN) 800 MG tablet Take by mouth.    Marland Kitchen ipratropium-albuterol (DUONEB) 0.5-2.5 (3) MG/3ML SOLN Inhale into the lungs.    . lamoTRIgine (LAMICTAL) 100 MG tablet Take by mouth.    . lisdexamfetamine (VYVANSE) 70 MG capsule Take by mouth.    . losartan-hydrochlorothiazide (HYZAAR) 50-12.5 MG tablet Take by mouth.    . lubiprostone (AMITIZA) 24 MCG capsule Take by mouth.    Marland Kitchen MAGNESIUM PO Take by mouth daily.    . Melatonin 10 MG TABS Take by mouth.    Marland Kitchen MILK THISTLE PO Take by mouth daily.    . montelukast (SINGULAIR) 10 MG tablet Take by mouth.    . Multiple Vitamins-Minerals (CENTRUM SILVER PO) Take by mouth daily.    Marland Kitchen omeprazole (PRILOSEC) 40 MG capsule Take by mouth.    . orlistat (ALLI) 60 MG capsule Take by mouth.    . sertraline (ZOLOFT) 50 MG tablet Take 50 mg by mouth daily.    . Tiotropium Bromide Monohydrate (SPIRIVA RESPIMAT) 1.25 MCG/ACT AERS Inhale into the lungs.    . valACYclovir (VALTREX) 1000 MG tablet Take by  mouth.    . gabapentin (NEURONTIN) 300 MG capsule Take by mouth.    . lisdexamfetamine (VYVANSE) 30 MG capsule Take by mouth.     No current facility-administered medications for this visit.     Allergies as of 03/19/2017 - Review Complete 03/19/2017  Allergen Reaction Noted  . Hydrocodone-acetaminophen Itching 01/16/2017    ROS:  General: Negative for anorexia, weight loss, fever, chills, fatigue, weakness. ENT: Negative for hoarseness, difficulty swallowing , nasal congestion. CV: Negative for chest pain, angina, palpitations, dyspnea on exertion, peripheral edema.  Respiratory: Negative for dyspnea at rest, dyspnea on exertion, cough, sputum, wheezing.  GI: See history of present illness. GU:  Negative for dysuria, hematuria, urinary incontinence, urinary frequency, nocturnal urination.  Endo: Negative for unusual weight change.    Physical Examination:   BP 140/76   Pulse (!) 105   Temp 98.9 F (37.2 C) (Oral)   Ht 5' (1.524 m)   Wt 129 lb (58.5 kg)   BMI 25.19 kg/m   General: Well-nourished, well-developed in no acute distress.  Eyes: No icterus. Conjunctivae pink. Neuro: Alert and oriented x 3.  Grossly intact. Skin: Warm and dry, no jaundice.   Psych: Alert and cooperative, normal mood and affect.  Labs:    Imaging Studies: US Abdomen Limited Ruq  Result Date: 03/06/2017 CLINICAL DATA:  Abdominal pain. EXAM: ULTRASOUND ABDOMEN LIMITED RIGHT  UPPER QUADRANT COMPARISON:  None. FINDINGS: Gallbladder: Cholecystectomy. Common bile duct: Diameter: 3.8 mm Liver: Increased echogenicity noted in right hepatic lobe consistent fatty infiltration. No focal hepatic abnormality identified. Portal vein is patent on color Doppler imaging with normal direction of blood flow towards the liver. IMPRESSION: 1. Fatty infiltration of the liver. 2. Cholecystectomy.  No biliary distention . Electronically Signed   By: Marcello Moores  Register   On: 03/06/2017 10:08    Assessment and Plan:    Bailey Flores is a 56 y.o. y/o female who had an EGD with findings consistent with intestinal metaplasia in the antrum.  The patient will be set up for repeat EGD with gastric mapping.  The patient will also be set up for a gastric emptying study due to her stomach full of food at the time of her endoscopy despite being nothing by mouth.    [name], MD. Marval Regal   Note: This dictation was prepared with Dragon dictation along with smaller phrase technology. Any transcriptional errors that result from this process are unintentional.

## 2017-03-20 ENCOUNTER — Other Ambulatory Visit: Payer: Self-pay

## 2017-03-20 DIAGNOSIS — K3189 Other diseases of stomach and duodenum: Secondary | ICD-10-CM

## 2017-03-21 ENCOUNTER — Encounter: Payer: Self-pay | Admitting: *Deleted

## 2017-03-22 NOTE — Discharge Instructions (Signed)
General Anesthesia, Adult, Care After °These instructions provide you with information about caring for yourself after your procedure. Your health care provider may also give you more specific instructions. Your treatment has been planned according to current medical practices, but problems sometimes occur. Call your health care provider if you have any problems or questions after your procedure. °What can I expect after the procedure? °After the procedure, it is common to have: °· Vomiting. °· A sore throat. °· Mental slowness. ° °It is common to feel: °· Nauseous. °· Cold or shivery. °· Sleepy. °· Tired. °· Sore or achy, even in parts of your body where you did not have surgery. ° °Follow these instructions at home: °For at least 24 hours after the procedure: °· Do not: °? Participate in activities where you could fall or become injured. °? Drive. °? Use heavy machinery. °? Drink alcohol. °? Take sleeping pills or medicines that cause drowsiness. °? Make important decisions or sign legal documents. °? Take care of children on your own. °· Rest. °Eating and drinking °· If you vomit, drink water, juice, or soup when you can drink without vomiting. °· Drink enough fluid to keep your urine clear or pale yellow. °· Make sure you have little or no nausea before eating solid foods. °· Follow the diet recommended by your health care provider. °General instructions °· Have a responsible adult stay with you until you are awake and alert. °· Return to your normal activities as told by your health care provider. Ask your health care provider what activities are safe for you. °· Take over-the-counter and prescription medicines only as told by your health care provider. °· If you smoke, do not smoke without supervision. °· Keep all follow-up visits as told by your health care provider. This is important. °Contact a health care provider if: °· You continue to have nausea or vomiting at home, and medicines are not helpful. °· You  cannot drink fluids or start eating again. °· You cannot urinate after 8-12 hours. °· You develop a skin rash. °· You have fever. °· You have increasing redness at the site of your procedure. °Get help right away if: °· You have difficulty breathing. °· You have chest pain. °· You have unexpected bleeding. °· You feel that you are having a life-threatening or urgent problem. °This information is not intended to replace advice given to you by your health care provider. Make sure you discuss any questions you have with your health care provider. °Document Released: 08/14/2000 Document Revised: 10/11/2015 Document Reviewed: 04/22/2015 °Elsevier Interactive Patient Education © 2018 Else[employer] Inc. ° °

## 2017-03-30 ENCOUNTER — Ambulatory Visit
Admission: RE | Admit: 2017-03-30 | Discharge: 2017-03-30 | Disposition: A | Discharge status: Home / Self Care | Source: Ambulatory Visit | Attending: Gastroenterology | Admitting: Gastroenterology

## 2017-03-30 ENCOUNTER — Ambulatory Visit: Admitting: Anesthesiology

## 2017-03-30 ENCOUNTER — Encounter
Admission: RE | Disposition: A | Discharge status: Home / Self Care | Payer: Self-pay | Source: Ambulatory Visit | Attending: Gastroenterology

## 2017-03-30 DIAGNOSIS — K219 Gastro-esophageal reflux disease without esophagitis: Secondary | ICD-10-CM | POA: Insufficient documentation

## 2017-03-30 DIAGNOSIS — I10 Essential (primary) hypertension: Secondary | ICD-10-CM | POA: Diagnosis not present

## 2017-03-30 DIAGNOSIS — K31A0 Gastric intestinal metaplasia, unspecified: Secondary | ICD-10-CM

## 2017-03-30 DIAGNOSIS — Z87891 Personal history of nicotine dependence: Secondary | ICD-10-CM | POA: Diagnosis not present

## 2017-03-30 DIAGNOSIS — K3189 Other diseases of stomach and duodenum: Secondary | ICD-10-CM

## 2017-03-30 DIAGNOSIS — K2950 Unspecified chronic gastritis without bleeding: Secondary | ICD-10-CM | POA: Insufficient documentation

## 2017-03-30 DIAGNOSIS — J449 Chronic obstructive pulmonary disease, unspecified: Secondary | ICD-10-CM | POA: Diagnosis not present

## 2017-03-30 DIAGNOSIS — Z79899 Other long term (current) drug therapy: Secondary | ICD-10-CM | POA: Insufficient documentation

## 2017-03-30 DIAGNOSIS — K2970 Gastritis, unspecified, without bleeding: Secondary | ICD-10-CM

## 2017-03-30 HISTORY — PX: ESOPHAGOGASTRODUODENOSCOPY (EGD) WITH PROPOFOL: SHX5813

## 2017-03-30 SURGERY — ESOPHAGOGASTRODUODENOSCOPY (EGD) WITH PROPOFOL
Anesthesia: Choice | Wound class: Clean Contaminated

## 2017-03-30 MED ORDER — GLYCOPYRROLATE 0.2 MG/ML IJ SOLN
INTRAMUSCULAR | Status: DC | PRN
  Administered 2017-03-30: 0.1 mg via INTRAVENOUS

## 2017-03-30 MED ORDER — LIDOCAINE HCL (CARDIAC) 20 MG/ML IV SOLN
INTRAVENOUS | Status: DC | PRN
Start: 2017-03-30 — End: 2017-03-30
  Administered 2017-03-30: 50 mg via INTRAVENOUS

## 2017-03-30 MED ORDER — LACTATED RINGERS IV SOLN
INTRAVENOUS | Status: DC | PRN
  Administered 2017-03-30: 12:00:00 via INTRAVENOUS

## 2017-03-30 MED ORDER — STERILE WATER FOR IRRIGATION IR SOLN
Status: DC | PRN
  Administered 2017-03-30: 12:00:00

## 2017-03-30 MED ORDER — PROPOFOL 10 MG/ML IV BOLUS
INTRAVENOUS | Status: DC | PRN
  Administered 2017-03-30: 120 mg via INTRAVENOUS
  Administered 2017-03-30 (×2): 40 mg via INTRAVENOUS

## 2017-03-30 SURGICAL SUPPLY — 32 items
BALLN DILATOR 10-12 8 (BALLOONS)
BALLN DILATOR 12-15 8 (BALLOONS)
BALLN DILATOR 15-18 8 (BALLOONS)
BALLN DILATOR CRE 0-12 8 (BALLOONS)
BALLN DILATOR ESOPH 8 10 CRE (MISCELLANEOUS) IMPLANT
BALLOON DILATOR 12-15 8 (BALLOONS) IMPLANT
BALLOON DILATOR 15-18 8 (BALLOONS) IMPLANT
BALLOON DILATOR CRE 0-12 8 (BALLOONS) IMPLANT
BLOCK BITE 60FR ADLT L/F GRN (MISCELLANEOUS) ×3 IMPLANT
CANISTER SUCT 1200ML W/VALVE (MISCELLANEOUS) ×3 IMPLANT
CLIP HMST 235XBRD CATH ROT (MISCELLANEOUS) IMPLANT
CLIP RESOLUTION 360 11X235 (MISCELLANEOUS)
FCP ESCP3.2XJMB 240X2.8X (MISCELLANEOUS)
FORCEPS BIOP RAD 4 LRG CAP 4 (CUTTING FORCEPS) IMPLANT
FORCEPS BIOP RJ4 240 W/NDL (MISCELLANEOUS)
FORCEPS ESCP3.2XJMB 240X2.8X (MISCELLANEOUS) IMPLANT
GOWN CVR UNV OPN BCK APRN NK (MISCELLANEOUS) ×2 IMPLANT
GOWN ISOL THUMB LOOP REG UNIV (MISCELLANEOUS) ×4
INJECTOR VARIJECT VIN23 (MISCELLANEOUS) IMPLANT
KIT DEFENDO VALVE AND CONN (KITS) IMPLANT
KIT ENDO PROCEDURE OLY (KITS) ×3 IMPLANT
MARKER SPOT ENDO TATTOO 5ML (MISCELLANEOUS) IMPLANT
PAD GROUND ADULT SPLIT (MISCELLANEOUS) IMPLANT
RETRIEVER NET PLAT FOOD (MISCELLANEOUS) IMPLANT
SNARE SHORT THROW 13M SML OVAL (MISCELLANEOUS) ×3 IMPLANT
SNARE SHORT THROW 30M LRG OVAL (MISCELLANEOUS) IMPLANT
SPOT EX ENDOSCOPIC TATTOO (MISCELLANEOUS)
SYR INFLATION 60ML (SYRINGE) IMPLANT
TRAP ETRAP POLY (MISCELLANEOUS) IMPLANT
VARIJECT INJECTOR VIN23 (MISCELLANEOUS)
WATER STERILE IRR 250ML POUR (IV SOLUTION) ×3 IMPLANT
WIRE CRE 18-20MM 8CM F G (MISCELLANEOUS) IMPLANT

## 2017-03-30 NOTE — Transfer of Care (Signed)
Immediate Anesthesia Transfer of Care Note  Patient: Bailey Flores  Procedure(s) Performed: ESOPHAGOGASTRODUODENOSCOPY (EGD) WITH PROPOFOL (N/A )  Patient Location: PACU  Anesthesia Type: No value filed.  Level of Consciousness: awake, alert  and patient cooperative  Airway and Oxygen Therapy: Patient Spontanous Breathing and Patient connected to supplemental oxygen  Post-op Assessment: Post-op Vital signs reviewed, Patient's Cardiovascular Status Stable, Respiratory Function Stable, Patent Airway and No signs of Nausea or vomiting  Post-op Vital Signs: Reviewed and stable  Complications: No apparent anesthesia complications

## 2017-03-30 NOTE — Anesthesia Procedure Notes (Signed)
Date/Time: 03/30/2017 12:00 PM Performed by: Cameron Ali, CRNA Pre-anesthesia Checklist: Patient identified, Emergency Drugs available, Suction available, Timeout performed and Patient being monitored Patient Re-evaluated:Patient Re-evaluated prior to induction Oxygen Delivery Method: Nasal cannula Placement Confirmation: positive ETCO2

## 2017-03-30 NOTE — H&P (Signed)
[name], MD [address] [place], [place] [address] Phone:[phone] Fax : [phone]  Primary Care Physician:  [name], MD Primary Gastroenterologist:  Dr. Allen Norris  Pre-Procedure History & Physical: HPI:  Bailey Flores is a 56 y.o. female is here for an endoscopy.   Past Medical History:  Diagnosis Date  . Arthritis    hands  . Asthma   . Barrett's esophagus   . COPD (chronic obstructive pulmonary disease) ([place])   . Hypertension   . Motion sickness    reading in car    Past Surgical History:  Procedure Laterality Date  . ABDOMINAL HYSTERECTOMY    . BOWEL RESECTION  07/19/2012  . CESAREAN SECTION     x3  . HAND SURGERY Right     Prior to Admission medications   Medication Sig Start Date End Date Taking? Authorizing Provider  albuterol (PROVENTIL HFA;VENTOLIN HFA) 108 (90 Base) MCG/ACT inhaler Inhale into the lungs.   Yes [provider]  buPROPion (WELLBUTRIN SR) 200 MG 12 hr tablet Take by mouth. 01/16/17  Yes [provider]  ePHEDrine-GuaiFENesin 25-400 MG TABS Take by mouth.   Yes [provider]  Fluticasone-Salmeterol (ADVAIR DISKUS) 250-50 MCG/DOSE AEPB Inhale into the lungs. 01/16/17  Yes [provider]  ibuprofen (ADVIL,MOTRIN) 800 MG tablet Take by mouth. 01/16/17  Yes [provider]  ipratropium-albuterol (DUONEB) 0.5-2.5 (3) MG/3ML SOLN Inhale into the lungs. 01/16/17 01/11/18 Yes [provider]  lamoTRIgine (LAMICTAL) 100 MG tablet Take by mouth. 01/16/17  Yes [provider]  lisdexamfetamine (VYVANSE) 70 MG capsule Take by mouth.   Yes [provider]  losartan-hydrochlorothiazide (HYZAAR) 50-12.5 MG tablet Take by mouth. 01/16/17  Yes [provider]  lubiprostone (AMITIZA) 24 MCG capsule Take by mouth. 01/16/17  Yes [provider]  MAGNESIUM PO Take by mouth daily.   Yes [provider]  Melatonin 10 MG TABS Take by mouth.    Yes [provider]  MILK THISTLE PO Take by mouth daily.   Yes [provider]  montelukast (SINGULAIR) 10 MG tablet Take by mouth. 01/16/17  Yes [provider]  Multiple Vitamins-Minerals (CENTRUM SILVER PO) Take by mouth daily.   Yes [provider]  omeprazole (PRILOSEC) 40 MG capsule Take by mouth. 01/16/17  Yes [provider]  orlistat (ALLI) 60 MG capsule Take by mouth.   Yes [provider]  sertraline (ZOLOFT) 50 MG tablet Take 50 mg by mouth daily. 03/07/17 06/05/17 Yes [provider]  Tiotropium Bromide Monohydrate (SPIRIVA RESPIMAT) 1.25 MCG/ACT AERS Inhale into the lungs. 01/18/17  Yes [provider]  gabapentin (NEURONTIN) 300 MG capsule Take 300 mg 3 (three) times daily by mouth.  02/15/17 03/17/17  [provider]  lisdexamfetamine (VYVANSE) 30 MG capsule Take by mouth. 02/07/17 03/09/17  [provider]  valACYclovir (VALTREX) 1000 MG tablet Take by mouth.    [provider]    Allergies as of 03/20/2017 - Review Complete 03/19/2017  Allergen Reaction Noted  . Hydrocodone-acetaminophen Itching 01/16/2017    History reviewed. No pertinent family history.  Social History   Socioeconomic History  . Marital status: Married    Spouse name: Not on file  . Number of children: Not on file  . Years of education: Not on file  . Highest education level: Not on file  Social Needs  . Financial resource strain: Not on file  . Food insecurity - worry: Not on file  .  Food insecurity - inability: Not on file  . Transportation needs - medical: Not on file  . Transportation needs - non-medical: Not on file  Occupational History  . Not on file  Tobacco Use  . Smoking status: Former Smoker    Packs/day: 2.00    Last attempt to quit: 07/19/2012    Years since quitting: 4.6  . Smokeless tobacco: Never Used  Substance and Sexual Activity  . Alcohol use: Yes    Comment: 2 drinks/mo  .  Drug use: No  . Sexual activity: Yes  Other Topics Concern  . Not on file  Social History Narrative  . Not on file    Review of Systems: See HPI, otherwise negative ROS  Physical Exam: BP 133/83   Pulse 80   Temp 97.6 F (36.4 C) (Temporal)   Ht 5' (1.524 m)   Wt 126 lb (57.2 kg)   SpO2 97%   BMI 24.61 kg/m  General:   Alert,  pleasant and cooperative in NAD Head:  Normocephalic and atraumatic. Neck:  Supple; no masses or thyromegaly. Lungs:  Clear throughout to auscultation.    Heart:  Regular rate and rhythm. Abdomen:  Soft, nontender and nondistended. Normal bowel sounds, without guarding, and without rebound.   Neurologic:  Alert and  oriented x4;  grossly normal neurologically.  Impression/Plan: Genia Perin is here for an endoscopy to be performed for gastric intestinal metaplasia  Risks, benefits, limitations, and alternatives regarding  endoscopy have been reviewed with the patient.  Questions have been answered.  All parties agreeable.   [name], MD  03/30/2017, 10:47 AM

## 2017-03-30 NOTE — Anesthesia Preprocedure Evaluation (Signed)
Anesthesia Evaluation  Patient identified by MRN, date of birth, ID band Patient awake    Reviewed: Allergy & Precautions, H&P , NPO status , Patient's Chart, lab work & pertinent test results  History of Anesthesia Complications Negative for: history of anesthetic complications  Airway Mallampati: II  TM Distance: >3 FB Neck ROM: full    Dental no notable dental hx.    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;  Adhd;  Reduced memory; negative neurological ROS     GI/Hepatic Neg liver ROS, GERD  Controlled,  Endo/Other  negative endocrine ROS  Renal/GU negative Renal ROS  negative genitourinary   Musculoskeletal  (+) Arthritis ,   Abdominal   Peds  Hematology negative hematology ROS (+) Bile duct cancer 2014    Anesthesia Other Findings tiva  Reproductive/Obstetrics                             Anesthesia Physical  Anesthesia Plan  ASA: II  Anesthesia Plan:    Post-op Pain Management:    Induction:   PONV Risk Score and Plan: 2 and Midazolam  Airway Management Planned: Natural Airway  Additional Equipment:   Intra-op Plan:   Post-operative Plan:   Informed Consent: I have reviewed the patients History and Physical, chart, labs and discussed the procedure including the risks, benefits and alternatives for the proposed anesthesia with the patient or authorized representative who has indicated his/her understanding and acceptance.     Plan Discussed with: CRNA  Anesthesia Plan Comments:         Anesthesia Quick Evaluation

## 2017-03-30 NOTE — Anesthesia Postprocedure Evaluation (Signed)
Anesthesia Post Note  Patient: Bailey Flores  Procedure(s) Performed: ESOPHAGOGASTRODUODENOSCOPY (EGD) WITH PROPOFOL (N/A )  Patient location during evaluation: PACU Anesthesia Type: General Level of consciousness: awake and alert Pain management: pain level controlled Vital Signs Assessment: post-procedure vital signs reviewed and stable Respiratory status: spontaneous breathing, nonlabored ventilation, respiratory function stable and patient connected to nasal cannula oxygen Cardiovascular status: blood pressure returned to baseline and stable Postop Assessment: no apparent nausea or vomiting Anesthetic complications: no    Sherica Paternostro

## 2017-03-30 NOTE — Op Note (Signed)
[facility] Patient Name: Bailey Flores Procedure Date: 03/30/2017 11:54 AM MRN: [id number] Account #: [id number] Date of Birth: Jul 07, 1960 Admit Type: Outpatient Age: 56 Room: [facility] OR ROOM 01 Gender: Female Note Status: Finalized Procedure:            Upper GI endoscopy Indications:          Intestinal metaplasia Providers:            [name] MD, MD Referring MD:         [name] (Referring MD) Medicines:            Propofol per Anesthesia Complications:        No immediate complications. Procedure:            Pre-Anesthesia Assessment:                       - Prior to the procedure, a History and Physical was                        performed, and patient medications and allergies were                        reviewed. The patient's tolerance of previous                        anesthesia was also reviewed. The risks and benefits of                        the procedure and the sedation options and risks were                        discussed with the patient. All questions were                        answered, and informed consent was obtained. Prior                        Anticoagulants: The patient has taken no previous                        anticoagulant or antiplatelet agents. ASA Grade                        Assessment: II - A patient with mild systemic disease.                        After reviewing the risks and benefits, the patient was                        deemed in satisfactory condition to undergo the                        procedure.                       After obtaining informed consent, the endoscope was                        passed under direct vision. Throughout the procedure,  the patient's blood pressure, pulse, and oxygen                        saturations were monitored continuously. The Olympus                        GIF H180J Endoscope (S#: [id number]) was introduced   through the mouth, and advanced to the second part of                        duodenum. The upper GI endoscopy was accomplished                        without difficulty. The patient tolerated the procedure                        well. Findings:      The examined esophagus was normal.      Diffuse mild inflammation characterized by erythema was found in the       gastric antrum. Three biopsies were obtained with cold forceps for       histology in the gastric antrum, as well as three biopsies at the       incisura, three biopsies on the greater curvature of the stomach and       three biopsies on the lesser curvature of the stomach.      The examined duodenum was normal. Impression:           - Normal esophagus.                       - Gastritis.                       - Normal examined duodenum.                       - Biopsies performed in the gastric antrum, at the                        incisura, on the greater curvature of the stomach and                        on the lesser curvature of the stomach. Recommendation:       - Discharge patient to home.                       - Resume previous diet.                       - Continue present medications.                       - Await pathology results.                       - Repeat upper endoscopy in 2 years for surveillance. Procedure Code(s):    --- Professional ---                       [phone], Esophagogastroduodenoscopy, flexible, transoral;                        with  biopsy, single or multiple Diagnosis Code(s):    --- Professional ---                       K31.89, Other diseases of stomach and duodenum                       K29.70, Gastritis, unspecified, without bleeding CPT copyright 2016 American Medical Association. All rights reserved. The codes documented in this report are preliminary and upon coder review may  be revised to meet current compliance requirements. [name] MD, MD 03/30/2017 12:15:17 PM This report has  been signed electronically. Number of Addenda: 0 Note Initiated On: 03/30/2017 11:54 AM      [facility]

## 2017-04-02 ENCOUNTER — Encounter: Payer: Self-pay | Admitting: Gastroenterology

## 2017-04-02 ENCOUNTER — Ambulatory Visit
Admission: RE | Admit: 2017-04-02 | Discharge: 2017-04-02 | Disposition: A | Discharge status: Home / Self Care | Source: Ambulatory Visit | Attending: Urgent Care | Admitting: Urgent Care

## 2017-04-02 DIAGNOSIS — Z1231 Encounter for screening mammogram for malignant neoplasm of breast: Secondary | ICD-10-CM | POA: Diagnosis not present

## 2017-04-02 DIAGNOSIS — C7A8 Other malignant neuroendocrine tumors: Secondary | ICD-10-CM

## 2017-04-02 HISTORY — DX: Malignant (primary) neoplasm, unspecified: C80.1

## 2017-04-03 ENCOUNTER — Encounter: Payer: Self-pay | Admitting: Gastroenterology

## 2017-04-09 ENCOUNTER — Telehealth: Payer: Self-pay

## 2017-04-09 NOTE — Telephone Encounter (Signed)
-----   Message from [name], MD sent at 04/03/2017  5:58 PM EST ----- Let the patient know that there was no sign of any cancerous lesions.  Repeat EGD in 2 years.

## 2017-04-09 NOTE — Telephone Encounter (Signed)
Pt notified of EGD results. Added to 2 year recall list.

## 2017-04-17 NOTE — Progress Notes (Signed)
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  HPI: The patient was was last seen in the medical oncology clinic on 03/14/2017 following an initial consultation following a referral from Dr. Ellison Hughs.  At that time patient felt "ok". She complained of chronic pain with sensitivity over a healed surgical site on her right abdomen. Additionally, patient complained of constipation, which was managed with OTC interventions. Medical records were requested from patient's previous treating physicians in [place].  Patient underwent an upper endoscopy on 03/30/2017 with Dr. Allen Norris.  EGD results revealed mucosal changes consistent with gastritis. Pathology was negative for malignancy. H. pylori testing was negative.  Recommendations were to repeat EGD in 2 years.  Patient had annual screening mammogram done on 04/02/2017 that revealed no mammographic evidence of malignancy.  Symptomatically, she is doing well.  She denies any complaints.   Past Medical History:  Diagnosis Date  . Arthritis    hands  . Asthma   . Barrett's esophagus   . Cancer [facility])    neuroendocrine ca  . COPD (chronic obstructive pulmonary disease) ([place])   . Hypertension   . Motion sickness    reading in car    Past Surgical History:  Procedure Laterality Date  . ABDOMINAL HYSTERECTOMY    . BOWEL RESECTION  07/19/2012  . CESAREAN SECTION     x3  . ESOPHAGOGASTRODUODENOSCOPY (EGD) WITH PROPOFOL N/A 03/05/2017   Procedure: ESOPHAGOGASTRODUODENOSCOPY (EGD) WITH PROPOFOL;  Surgeon: [name], MD;  Location: [place];  Service: Endoscopy;  Laterality: N/A;  . ESOPHAGOGASTRODUODENOSCOPY (EGD) WITH PROPOFOL N/A 03/30/2017   REPEAT IN 03/2019  . HAND SURGERY Right     Family History  Problem Relation Age of Onset  . Breast cancer Cousin        mat cousin    Social  History:  reports that she quit smoking about 4 years ago. She smoked 2.00 packs per day. she has never used smokeless tobacco. She reports that she drinks alcohol. She reports that she does not use drugs.  She smoked 1 1/2 - 2 packs/day from the 77 (age 85) to 06/2012.  When she was younger, she only smoked 1/2 pack per day.  She is from [place].  She grew up in [place].  She previously lived in [facility], but sought medical attention in [facility] [facility] then [place].  She moved to [place] in 07/2016.  She lives in [place].  The patient is alone today.  Allergies:  Allergies  Allergen Reactions  . Hydrocodone-Acetaminophen Itching    Current Medications: Current Outpatient Medications  Medication Sig Dispense Refill  . albuterol (PROVENTIL HFA;VENTOLIN HFA) 108 (90 Base) MCG/ACT inhaler Inhale into the lungs.    Marland Kitchen buPROPion (WELLBUTRIN SR) 200 MG 12 hr tablet Take by mouth.    Marland Kitchen ePHEDrine-GuaiFENesin 25-400 MG TABS Take by mouth.    . Fluticasone-Salmeterol (ADVAIR DISKUS) 250-50 MCG/DOSE AEPB Inhale into the lungs.    . gabapentin (NEURONTIN) 300 MG capsule Take 300 mg 3 (three) times daily by mouth.     Marland Kitchen ibuprofen (ADVIL,MOTRIN) 800 MG tablet Take by mouth.    Marland Kitchen ipratropium-albuterol (DUONEB) 0.5-2.5 (3) MG/3ML SOLN Inhale into the lungs.    . lamoTRIgine (LAMICTAL) 100 MG tablet Take by mouth.    . lisdexamfetamine (VYVANSE) 30 MG capsule Take by mouth.    Marland Kitchen  lisdexamfetamine (VYVANSE) 70 MG capsule Take by mouth.    . losartan-hydrochlorothiazide (HYZAAR) 50-12.5 MG tablet Take by mouth.    . lubiprostone (AMITIZA) 24 MCG capsule Take by mouth.    Marland Kitchen MAGNESIUM PO Take by mouth daily.    . Melatonin 10 MG TABS Take by mouth.    Marland Kitchen MILK THISTLE PO Take by mouth daily.    . montelukast (SINGULAIR) 10 MG tablet Take by mouth.    . Multiple Vitamins-Minerals (CENTRUM SILVER PO) Take by mouth daily.    Marland Kitchen omeprazole (PRILOSEC) 40 MG capsule Take by mouth.    .  orlistat (ALLI) 60 MG capsule Take by mouth.    . Tiotropium Bromide Monohydrate (SPIRIVA RESPIMAT) 1.25 MCG/ACT AERS Inhale into the lungs.    . valACYclovir (VALTREX) 1000 MG tablet Take by mouth.    . sertraline (ZOLOFT) 50 MG tablet Take 50 mg by mouth daily.     No current facility-administered medications for this visit.     Review of Systems:  GENERAL:  Feels "well".  No fevers or sweats.  Weight loss of 2 pounds since last visit PERFORMANCE STATUS (ECOG):  1 HEENT:  No visual changes, runny nose, sore throat, mouth sores or tenderness. Lungs: Shortness of breath secondary to COPD.  Would like to see a pulmonologist.  No cough.  No hemoptysis. Cardiac:  No chest pain, palpitations, orthopnea, or PND. Breasts:  No breast concerns.  Mammogram 04/02/2017. GI:  Intermittent nausea.  Constipation unless takes magnesium or Amitiza.  No vomiting, diarrhea, melena or hematochezia.  Last colonoscopy about 9 years ago. GU:  No urgency, frequency, dysuria, or hematuria. Musculoskeletal:  No back pain.  Arthritis in hands and right shoulder.  No muscle tenderness. Extremities:  No pain or swelling. Skin:  No rashes or skin changes. Neuro:  Some memory issues (word finding).  Sometimes loses balance.  No headache, numbness or weakness, or coordination issues. Endocrine:  No diabetes, thyroid issues, hot flashes or night sweats. Psych:  No mood changes, depression or anxiety. Pain:  Chronic pain associated with her RUQ incision.  Notes hight tolerance for pain. Review of systems:  All other systems reviewed and found to be negative.  Physical Exam: Blood pressure (!) 142/92, pulse 88, temperature 97.7 F (36.5 C), temperature source Tympanic, resp. rate 18, weight 127 lb 6.8 oz (57.8 kg). GENERAL:  Well developed, well nourished, woman sitting comfortably in the exam room in no acute distress. MENTAL STATUS:  Alert and oriented to person, place and time. HEAD:  Long black slightly graying  hair.  Normocephalic, atraumatic, face symmetric, no Cushingoid features. EYES:  Glasses.  Brown eyes.  No conjunctivitis or scleral icterus. RESPIRATORY:  Clear to auscultation without rales, wheezes or rhonchi. CARDIOVASCULAR:  Regular rate and rhythm without murmur, rub or gallop. ABDOMEN:  Well healed RUQ incision.  Soft, tender to minimal palpation over scar.  No guarding or rebound tenderness.  Active bowel sounds and no hepatosplenomegaly.  No masses. SKIN:  No rashes, ulcers or lesions. EXTREMITIES: No edema, no skin discoloration or tenderness.  No palpable cords. LYMPH NODES: No palpable cervical, supraclavicular, axillary or inguinal adenopathy  NEUROLOGICAL: Unremarkable. PSYCH:  Appropriate.   Appointment on 04/18/2017  Component Date Value Ref Range Status  . Sodium 04/18/2017 136  135 - 145 mmol/L Final  . Potassium 04/18/2017 4.0  3.5 - 5.1 mmol/L Final  . Chloride 04/18/2017 101  101 - 111 mmol/L Final  . CO2 04/18/2017 27  22 -  32 mmol/L Final  . Glucose, Bld 04/18/2017 102* 65 - 99 mg/dL Final  . BUN 04/18/2017 33* 6 - 20 mg/dL Final  . Creatinine, Ser 04/18/2017 0.79  0.44 - 1.00 mg/dL Final  . Calcium 04/18/2017 9.1  8.9 - 10.3 mg/dL Final  . Total Protein 04/18/2017 7.6  6.5 - 8.1 g/dL Final  . Albumin 04/18/2017 4.4  3.5 - 5.0 g/dL Final  . AST 04/18/2017 28  15 - 41 U/L Final  . ALT 04/18/2017 31  14 - 54 U/L Final  . Alkaline Phosphatase 04/18/2017 78  38 - 126 U/L Final  . Total Bilirubin 04/18/2017 0.5  0.3 - 1.2 mg/dL Final  . GFR calc non Af Amer 04/18/2017 >60  >60 mL/min Final  . GFR calc Af Amer 04/18/2017 >60  >60 mL/min Final   Comment: (NOTE) The eGFR has been calculated using the CKD EPI equation. This calculation has not been validated in all clinical situations. eGFR's persistently <60 mL/min signify possible Chronic Kidney Disease.   . Anion gap 04/18/2017 8  5 - 15 Final  . WBC 04/18/2017 7.4  3.6 - 11.0 K/uL Final  . RBC 04/18/2017 4.45   3.80 - 5.20 MIL/uL Final  . Hemoglobin 04/18/2017 12.8  12.0 - 16.0 g/dL Final  . HCT 04/18/2017 39.0  35.0 - 47.0 % Final  . MCV 04/18/2017 87.5  80.0 - 100.0 fL Final  . MCH 04/18/2017 28.7  26.0 - 34.0 pg Final  . MCHC 04/18/2017 32.8  32.0 - 36.0 g/dL Final  . RDW 04/18/2017 14.8* 11.5 - 14.5 % Final  . Platelets 04/18/2017 318  150 - 440 K/uL Final  . Neutrophils Relative % 04/18/2017 49  % Final  . Neutro Abs 04/18/2017 3.6  1.4 - 6.5 K/uL Final  . Lymphocytes Relative 04/18/2017 40  % Final  . Lymphs Abs 04/18/2017 3.0  1.0 - 3.6 K/uL Final  . Monocytes Relative 04/18/2017 9  % Final  . Monocytes Absolute 04/18/2017 0.7  0.2 - 0.9 K/uL Final  . Eosinophils Relative 04/18/2017 1  % Final  . Eosinophils Absolute 04/18/2017 0.1  0 - 0.7 K/uL Final  . Basophils Relative 04/18/2017 1  % Final  . Basophils Absolute 04/18/2017 0.1  0 - 0.1 K/uL Final    Assessment:  Bailey Flores is a 56 y.o. female with a history of bile duct tumor s/p resection in 06/2012 while living in [place].  She was told that she had a "rare slow growing tumor" that was completely resected.  No pathology is available.  Abdominal ultrasound on 03/06/2017 revealed fatty infiltration of the liver.  She is status post cholecystectomy with no biliary distention.  EGD on 03/05/2017 revealed a normal esophagus.  There was a large amount of food (residue) in the stomach.  There was gastritis. 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  There was no dysplasia or Helicobacter pylori.  Duodenum was normal.  EGD on 03/30/2017 revealed a normal esophagus.  There was gastritis. 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  There was no dysplasia or Helicobacter pylori.  Duodenum was normal.  Bilateral mammogram on 04/02/2017  revealed no mammographic evidence of malignancy.  Last colonoscopy was < 10 years ago.     She has a > 50 pack year  smoking history (stopped smoking 2014).  Symptomatically, she is doing well.  She has chronic post-operative RUQ pain.  She denies any flushing or diarrhea.  Exam is stable.  Plan: 1.  Labs today; CBC with differential, CMP, chromogranin A. 2.  Discuss EGD results- negative for malignancy. 3.  Review mammogram - negative. 4.  Discuss no records from prior physician.  Patient to assist. 5.  Consult pulmonary medicine- COPD. Per patient request. 6.  Discuss low dose chest CT cancer screening program.  Patient has a >50 pack year smoking history.   She is agreeable. 7.  RTC in 3 months for MD assessment and review of outside records and low dose chest CT.   Lequita Asal, MD  04/18/2017, 11:25 AM

## 2017-04-18 ENCOUNTER — Encounter: Payer: Self-pay | Admitting: Hematology and Oncology

## 2017-04-18 ENCOUNTER — Inpatient Hospital Stay: Attending: Hematology and Oncology | Admitting: Hematology and Oncology

## 2017-04-18 ENCOUNTER — Inpatient Hospital Stay

## 2017-04-18 VITALS — BP 142/92 | HR 88 | Temp 97.7°F | Resp 18 | Wt 127.4 lb

## 2017-04-18 DIAGNOSIS — K2270 Barrett's esophagus without dysplasia: Secondary | ICD-10-CM | POA: Diagnosis not present

## 2017-04-18 DIAGNOSIS — M129 Arthropathy, unspecified: Secondary | ICD-10-CM | POA: Insufficient documentation

## 2017-04-18 DIAGNOSIS — Z79899 Other long term (current) drug therapy: Secondary | ICD-10-CM | POA: Diagnosis not present

## 2017-04-18 DIAGNOSIS — C7A8 Other malignant neuroendocrine tumors: Secondary | ICD-10-CM | POA: Insufficient documentation

## 2017-04-18 DIAGNOSIS — Z87891 Personal history of nicotine dependence: Secondary | ICD-10-CM | POA: Insufficient documentation

## 2017-04-18 DIAGNOSIS — J449 Chronic obstructive pulmonary disease, unspecified: Secondary | ICD-10-CM | POA: Insufficient documentation

## 2017-04-18 DIAGNOSIS — K5900 Constipation, unspecified: Secondary | ICD-10-CM | POA: Diagnosis not present

## 2017-04-18 DIAGNOSIS — I10 Essential (primary) hypertension: Secondary | ICD-10-CM | POA: Diagnosis not present

## 2017-04-18 DIAGNOSIS — R1011 Right upper quadrant pain: Secondary | ICD-10-CM | POA: Insufficient documentation

## 2017-04-18 LAB — CBC WITH DIFFERENTIAL/PLATELET
Basophils Absolute: 0.1 10*3/uL (ref 0–0.1)
Basophils Relative: 1 %
Eosinophils Absolute: 0.1 10*3/uL (ref 0–0.7)
Eosinophils Relative: 1 %
HCT: 39 % (ref 35.0–47.0)
Hemoglobin: 12.8 g/dL (ref 12.0–16.0)
Lymphocytes Relative: 40 %
Lymphs Abs: 3 10*3/uL (ref 1.0–3.6)
MCH: 28.7 pg (ref 26.0–34.0)
MCHC: 32.8 g/dL (ref 32.0–36.0)
MCV: 87.5 fL (ref 80.0–100.0)
Monocytes Absolute: 0.7 10*3/uL (ref 0.2–0.9)
Monocytes Relative: 9 %
Neutro Abs: 3.6 10*3/uL (ref 1.4–6.5)
Neutrophils Relative %: 49 %
Platelets: 318 10*3/uL (ref 150–440)
RBC: 4.45 MIL/uL (ref 3.80–5.20)
RDW: 14.8 % — ABNORMAL HIGH (ref 11.5–14.5)
WBC: 7.4 10*3/uL (ref 3.6–11.0)

## 2017-04-18 LAB — COMPREHENSIVE METABOLIC PANEL
ALT: 31 U/L (ref 14–54)
AST: 28 U/L (ref 15–41)
Albumin: 4.4 g/dL (ref 3.5–5.0)
Alkaline Phosphatase: 78 U/L (ref 38–126)
Anion gap: 8 (ref 5–15)
BUN: 33 mg/dL — ABNORMAL HIGH (ref 6–20)
CO2: 27 mmol/L (ref 22–32)
Calcium: 9.1 mg/dL (ref 8.9–10.3)
Chloride: 101 mmol/L (ref 101–111)
Creatinine, Ser: 0.79 mg/dL (ref 0.44–1.00)
GFR calc Af Amer: >60 mL/min (ref >60)
GFR calc non Af Amer: >60 mL/min (ref >60)
Glucose, Bld: 102 mg/dL — ABNORMAL HIGH (ref 65–99)
Potassium: 4 mmol/L (ref 3.5–5.1)
Sodium: 136 mmol/L (ref 135–145)
Total Bilirubin: 0.5 mg/dL (ref 0.3–1.2)
Total Protein: 7.6 g/dL (ref 6.5–8.1)

## 2017-04-18 NOTE — Progress Notes (Signed)
Patient offers no complaints today. 

## 2017-04-19 LAB — CHROMOGRANIN A: Chromogranin A: 9 nmol/L — ABNORMAL HIGH (ref 0–5)

## 2017-04-20 ENCOUNTER — Telehealth: Payer: Self-pay | Admitting: *Deleted

## 2017-04-20 ENCOUNTER — Other Ambulatory Visit: Payer: Self-pay | Admitting: Urgent Care

## 2017-04-20 DIAGNOSIS — C7A8 Other malignant neuroendocrine tumors: Secondary | ICD-10-CM

## 2017-04-20 NOTE — Telephone Encounter (Signed)
Called patient to inform her that she will need to go by [facility] in [place] to pick up 24 hour urine container today.  States she will do so.

## 2017-04-24 ENCOUNTER — Inpatient Hospital Stay: Attending: Hematology and Oncology

## 2017-04-24 ENCOUNTER — Telehealth: Payer: Self-pay | Admitting: *Deleted

## 2017-04-24 DIAGNOSIS — Z122 Encounter for screening for malignant neoplasm of respiratory organs: Secondary | ICD-10-CM | POA: Diagnosis present

## 2017-04-24 DIAGNOSIS — C7A8 Other malignant neuroendocrine tumors: Secondary | ICD-10-CM

## 2017-04-24 DIAGNOSIS — Z87891 Personal history of nicotine dependence: Secondary | ICD-10-CM

## 2017-04-24 NOTE — Telephone Encounter (Signed)
Received referral for initial lung cancer screening scan. Contacted patient and obtained smoking history,(former, quit 2014, 74 pack year) as well as answering questions related to screening process. Patient denies signs of lung cancer such as weight loss or hemoptysis. Patient denies comorbidity that would prevent curative treatment if lung cancer were found. Patient is scheduled for shared decision making visit and CT scan on 05/01/17.

## 2017-04-25 ENCOUNTER — Encounter: Payer: Self-pay | Admitting: Hematology and Oncology

## 2017-04-26 ENCOUNTER — Encounter: Payer: Self-pay | Admitting: Hematology and Oncology

## 2017-04-26 DIAGNOSIS — G3184 Mild cognitive impairment, so stated: Secondary | ICD-10-CM | POA: Insufficient documentation

## 2017-04-28 LAB — 5 HIAA, QUANTITATIVE, URINE, 24 HOUR
5-HIAA, Ur: 4.7 mg/L
5-HIAA,Quant.,24 Hr Urine: 4.9 mg/24 hr (ref 0.0–14.9)
Total Volume: 1050

## 2017-05-01 ENCOUNTER — Inpatient Hospital Stay (HOSPITAL_BASED_OUTPATIENT_CLINIC_OR_DEPARTMENT_OTHER): Admitting: Nurse Practitioner

## 2017-05-01 ENCOUNTER — Encounter: Payer: Self-pay | Admitting: Nurse Practitioner

## 2017-05-01 ENCOUNTER — Ambulatory Visit
Admission: RE | Admit: 2017-05-01 | Discharge: 2017-05-01 | Disposition: A | Discharge status: Home / Self Care | Source: Ambulatory Visit | Attending: Nurse Practitioner | Admitting: Nurse Practitioner

## 2017-05-01 DIAGNOSIS — I2510 Atherosclerotic heart disease of native coronary artery without angina pectoris: Secondary | ICD-10-CM | POA: Diagnosis not present

## 2017-05-01 DIAGNOSIS — Z87891 Personal history of nicotine dependence: Secondary | ICD-10-CM | POA: Diagnosis not present

## 2017-05-01 DIAGNOSIS — J439 Emphysema, unspecified: Secondary | ICD-10-CM | POA: Insufficient documentation

## 2017-05-01 DIAGNOSIS — Z122 Encounter for screening for malignant neoplasm of respiratory organs: Secondary | ICD-10-CM | POA: Diagnosis present

## 2017-05-01 NOTE — Progress Notes (Signed)
In accordance with CMS guidelines, patient has met eligibility criteria including age, absence of signs or symptoms of lung cancer.  Social History   Tobacco Use  . Smoking status: Former Smoker    Packs/day: 2.00    Years: 37.00    Pack years: 74.00    Last attempt to quit: 07/19/2012    Years since quitting: 4.7  . Smokeless tobacco: Never Used  Substance Use Topics  . Alcohol use: Yes    Comment: 2 drinks/mo  . Drug use: No     A shared decision-making session was conducted prior to the performance of CT scan. This includes one or more decision aids, includes benefits and harms of screening, follow-up diagnostic testing, over-diagnosis, false positive rate, and total radiation exposure.  Counseling on the importance of adherence to annual lung cancer LDCT screening, impact of co-morbidities, and ability or willingness to undergo diagnosis and treatment is imperative for compliance of the program.  Counseling on the importance of continued smoking cessation for former smokers; the importance of smoking cessation for current smokers, and information about tobacco cessation interventions have been given to patient including [place] and [facility] quit [place] programs.  Written order for lung cancer screening with LDCT has been given to the patient and any and all questions have been answered to the best of my abilities.   Yearly follow up will be coordinated by Burgess Estelle, Thoracic Navigator.  Beckey Rutter, DNP, AGNP-C 05/01/17 5:42 PM

## 2017-05-03 ENCOUNTER — Encounter: Payer: Self-pay | Admitting: *Deleted
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
## 2017-05-04 ENCOUNTER — Encounter: Payer: Self-pay | Admitting: Internal Medicine

## 2017-05-04 ENCOUNTER — Ambulatory Visit (INDEPENDENT_AMBULATORY_CARE_PROVIDER_SITE_OTHER): Admitting: Internal Medicine

## 2017-05-04 VITALS — BP 130/98 | HR 111 | Resp 16 | Ht 60.0 in | Wt 128.0 lb

## 2017-05-04 DIAGNOSIS — J449 Chronic obstructive pulmonary disease, unspecified: Secondary | ICD-10-CM

## 2017-05-04 DIAGNOSIS — Z23 Encounter for immunization: Secondary | ICD-10-CM | POA: Diagnosis not present

## 2017-05-04 MED ORDER — PNEUMOCOCCAL 13-VAL CONJ VACC IM SUSP
0.5000 mL | INTRAMUSCULAR | Status: AC
  Administered 2017-05-04: 0.5 mL via INTRAMUSCULAR

## 2017-05-04 NOTE — Patient Instructions (Addendum)
--  You should stop taking the Bronchaid, this can cause a fatal heart rhythm.   --Use your rescue inhaler more often if needed.   --You should try to increase your activity level, will send you for pulmonary rehab.   --Will send you for a sleep study.   --Will perform alpha-1 antitrypsin test and send for a PFT.   --Flu vaccine. Prevnar-13 today.     Sleep Apnea Sleep apnea is disorder that affects a person's sleep. A person with sleep apnea has abnormal pauses in their breathing when they sleep. It is hard for them to get a good sleep. This makes a person tired during the day. It also can lead to other physical problems. There are three types of sleep apnea. One type is when breathing stops for a short time because your airway is blocked (obstructive sleep apnea). Another type is when the brain sometimes fails to give the normal signal to breathe to the muscles that control your breathing (central sleep apnea). The third type is a combination of the other two types. HOME CARE   Take all medicine as told by your doctor.  Avoid alcohol, calming medicines (sedatives), and depressant drugs.  Try to lose weight if you are overweight. Talk to your doctor about a healthy weight goal.  Your doctor may have you use a device that helps to open your airway. It can help you get the air that you need. It is called a positive airway pressure (PAP) device.   MAKE SURE YOU:   Understand these instructions.  Will watch your condition.  Will get help right away if you are not doing well or get worse.  It may take approximately 1 month for you to get used to wearing her CPAP every night.  Be sure to work with your machine to get used to it, be patient, it may take time!

## 2017-05-28 ENCOUNTER — Ambulatory Visit (INDEPENDENT_AMBULATORY_CARE_PROVIDER_SITE_OTHER): Admitting: Licensed Clinical Social Worker

## 2017-05-28 DIAGNOSIS — F319 Bipolar disorder, unspecified: Secondary | ICD-10-CM | POA: Diagnosis not present
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
## 2017-06-05 ENCOUNTER — Ambulatory Visit: Payer: Self-pay | Admitting: Licensed Clinical Social Worker

## 2017-06-11 ENCOUNTER — Ambulatory Visit

## 2017-06-13 ENCOUNTER — Other Ambulatory Visit
Admission: RE | Admit: 2017-06-13 | Discharge: 2017-06-13 | Disposition: A | Discharge status: Home / Self Care | Source: Ambulatory Visit | Attending: Psychiatry | Admitting: Psychiatry

## 2017-06-13 ENCOUNTER — Ambulatory Visit (INDEPENDENT_AMBULATORY_CARE_PROVIDER_SITE_OTHER): Admitting: Psychiatry

## 2017-06-13 ENCOUNTER — Other Ambulatory Visit: Payer: Self-pay

## 2017-06-13 ENCOUNTER — Encounter: Payer: Self-pay | Admitting: Psychiatry

## 2017-06-13 VITALS — BP 147/87 | HR 92 | Temp 97.8°F | Wt 130.0 lb

## 2017-06-13 DIAGNOSIS — F341 Dysthymic disorder: Secondary | ICD-10-CM | POA: Diagnosis not present

## 2017-06-13 DIAGNOSIS — F3130 Bipolar disorder, current episode depressed, mild or moderate severity, unspecified: Secondary | ICD-10-CM

## 2017-06-13 DIAGNOSIS — F909 Attention-deficit hyperactivity disorder, unspecified type: Secondary | ICD-10-CM | POA: Diagnosis not present

## 2017-06-13 DIAGNOSIS — M19011 Primary osteoarthritis, right shoulder: Secondary | ICD-10-CM | POA: Insufficient documentation

## 2017-06-13 LAB — COMPREHENSIVE METABOLIC PANEL
ALBUMIN: 4.2 g/dL (ref 3.5–5.0)
ALT: 26 U/L (ref 14–54)
ANION GAP: 9 (ref 5–15)
AST: 26 U/L (ref 15–41)
Alkaline Phosphatase: 72 U/L (ref 38–126)
BILIRUBIN TOTAL: 0.4 mg/dL (ref 0.3–1.2)
BUN: 26 mg/dL — AB (ref 6–20)
CO2: 27 mmol/L (ref 22–32)
Calcium: 9.3 mg/dL (ref 8.9–10.3)
Chloride: 104 mmol/L (ref 101–111)
Creatinine, Ser: 0.78 mg/dL (ref 0.44–1.00)
GFR calc Af Amer: >60 mL/min (ref >60)
GFR calc non Af Amer: >60 mL/min (ref >60)
GLUCOSE: 78 mg/dL (ref 65–99)
Potassium: 3.8 mmol/L (ref 3.5–5.1)
SODIUM: 140 mmol/L (ref 135–145)
Total Protein: 7.3 g/dL (ref 6.5–8.1)

## 2017-06-13 LAB — CBC WITH DIFFERENTIAL/PLATELET
BASOS ABS: 0 10*3/uL (ref 0–0.1)
Basophils Relative: 1 %
EOS ABS: 0.1 10*3/uL (ref 0–0.7)
EOS PCT: 1 %
HCT: 38.8 % (ref 35.0–47.0)
HEMOGLOBIN: 12.7 g/dL (ref 12.0–16.0)
Lymphocytes Relative: 34 %
Lymphs Abs: 2.5 10*3/uL (ref 1.0–3.6)
MCH: 29 pg (ref 26.0–34.0)
MCHC: 32.7 g/dL (ref 32.0–36.0)
MCV: 88.6 fL (ref 80.0–100.0)
Monocytes Absolute: 0.7 10*3/uL (ref 0.2–0.9)
Monocytes Relative: 9 %
NEUTROS PCT: 55 %
Neutro Abs: 4.2 10*3/uL (ref 1.4–6.5)
PLATELETS: 335 10*3/uL (ref 150–440)
RBC: 4.38 MIL/uL (ref 3.80–5.20)
RDW: 15.2 % — ABNORMAL HIGH (ref 11.5–14.5)
WBC: 7.5 10*3/uL (ref 3.6–11.0)

## 2017-06-13 LAB — LIPID PANEL
Cholesterol: 183 mg/dL (ref 0–200)
HDL: 92 mg/dL (ref >40)
LDL CALC: 75 mg/dL (ref 0–99)
Total CHOL/HDL Ratio: 2 RATIO
Triglycerides: 80 mg/dL (ref <150)
VLDL: 16 mg/dL (ref 0–40)

## 2017-06-13 LAB — VITAMIN B12: VITAMIN B 12: 1429 pg/mL — AB (ref 180–914)

## 2017-06-13 LAB — FOLATE: FOLATE: 31 ng/mL (ref >5.9)

## 2017-06-13 LAB — TSH: TSH: 1.205 u[IU]/mL (ref 0.350–4.500)

## 2017-06-13 LAB — HEMOGLOBIN A1C
HEMOGLOBIN A1C: 5.9 % — AB (ref 4.8–5.6)
Mean Plasma Glucose: 122.63 mg/dL

## 2017-06-13 MED ORDER — BUPROPION HCL ER (XL) 300 MG PO TB24: 300.0000 mg | ORAL_TABLET | Freq: Every morning | ORAL | 1 refills | Status: DC

## 2017-06-13 MED ORDER — LURASIDONE HCL 40 MG PO TABS: 40.0000 mg | ORAL_TABLET | Freq: Every day | ORAL | 1 refills | Status: DC

## 2017-06-13 NOTE — Patient Instructions (Signed)
Lurasidone oral tablet What is this medicine? LURASIDONE (loo RAS i done) is an antipsychotic. It is used to treat schizophrenia or bipolar disorder, also known as manic-depression. This medicine may be used for other purposes; ask your health care provider or pharmacist if you have questions. COMMON BRAND NAME(S): Latuda What should I tell my health care provider before I take this medicine? They need to know if you have any of these conditions: -dementia -diabetes -difficulty swallowing -heart disease -history of breast cancer -kidney disease -liver disease -low blood counts, like low white cell, platelet, or red cell counts -low blood pressure -Parkinson's disease -seizures -suicidal thoughts, plans, or attempt; a previous suicide attempt by you or a family member -an unusual or allergic reaction to lurasidone, other medicines, foods, dyes, or preservatives -pregnant or trying to get pregnant -breast-feeding How should I use this medicine? Take this medicine by mouth with a glass of water. Follow the directions on the prescription label. Take this medicine with food. Take your medicine at regular intervals. Do not take it more often than directed. Do not stop taking except on your doctor's advice. Talk to your pediatrician regarding the use of this medicine in children. While this drug may be prescribed for children as young as 13 years for selected conditions, precautions do apply. Overdosage: If you think you have taken too much of this medicine contact a poison control center or emergency room at once. NOTE: This medicine is only for you. Do not share this medicine with others. What if I miss a dose? If you miss a dose, take it as soon as you can. If it is almost time for your next dose, take only that dose. Do not take double or extra doses. What may interact with this medicine? Do not take this medicine with any of the following medications: -dalfopristin;  quinupristin -delavirdine -indinavir -isoniazid -itraconazole -ketoconazole -lumacaftor; ivacaftor -metoclopramide -rifampin -ritonavir -tipranavir This medicine may also interact with the following medications: -alcohol -certain medicines for anxiety or sleep -diltiazem -digoxin -lithium -medicines for blood pressure This list may not describe all possible interactions. Give your health care provider a list of all the medicines, herbs, non-prescription drugs, or dietary supplements you use. Also tell them if you smoke, drink alcohol, or use illegal drugs. Some items may interact with your medicine. What should I watch for while using this medicine? Visit your doctor or health care professional for regular checks on your progress. It may be several weeks before you see the full effects of this medicine. Notify your doctor or health care professional if your symptoms get worse, if you have new symptoms, if you are having an unusual effect from this medicine, or if you feel out of control, very discouraged or think you might harm yourself or others. Do not suddenly stop taking this medicine. You may need to gradually reduce the dose. Ask your doctor or health care professional for advice. You may get dizzy or drowsy. Do not drive, use machinery, or do anything that needs mental alertness until you know how this medicine affects you. Do not stand or sit up quickly, especially if you are an older patient. This reduces the risk of dizzy or fainting spells. Alcohol can increase dizziness and drowsiness. Avoid alcoholic drinks. This medicine may cause dry eyes and blurred vision. If you wear contact lenses you may feel some discomfort. Lubricating drops may help. See your eye doctor if the problem does not go away or is severe. This medicine can   reduce the response of your body to heat or cold. Dress warm in cold weather and stay hydrated in hot weather. If possible, avoid extreme temperatures like  saunas, hot tubs, very hot or cold showers, or activities that can cause dehydration such as vigorous exercise. What side effects may I notice from receiving this medicine? Side effects that you should report to your doctor or health care professional as soon as possible: -allergic reactions like skin rash, itching or hives, swelling of the face, lips, or tongue -confusion -feeling faint or lightheaded, falls -fever or chills, sore throat -high fever -inability to control muscle movements in the face, mouth, hands, arms, or legs -increased hunger or thirst -increased urination -missed menstrual periods -restlessness or need to keep moving -seizures -stiffness, spasms, trembling -suicidal thoughts or other mood changes -unusually weak or tired Side effects that usually do not require medical attention (report to your doctor or health care professional if they continue or are bothersome): -blurred vision -change in sex drive or performance -diarrhea -drowsiness -nausea, vomiting -weight gain This list may not describe all possible side effects. Call your doctor for medical advice about side effects. You may report side effects to FDA at 1-800-FDA-1088. Where should I keep my medicine? Keep out of the reach of children. Store at room temperature between 15 and 30 degrees C (59 and 86 degrees F). Throw away any unused medicine after the expiration date. NOTE: This sheet is a summary. It may not cover all possible information. If you have questions about this medicine, talk to your doctor, pharmacist, or health care provider.  2018 Elsevier/Gold Standard (2015-06-23 10:23:22)  
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
## 2017-06-14 ENCOUNTER — Encounter: Payer: Self-pay | Admitting: Psychiatry

## 2017-06-14 LAB — PROLACTIN: Prolactin: 4.6 ng/mL — ABNORMAL LOW (ref 4.8–23.3)

## 2017-06-14 LAB — VITAMIN D 25 HYDROXY (VIT D DEFICIENCY, FRACTURES): Vit D, 25-Hydroxy: 22.2 ng/mL — ABNORMAL LOW (ref 30.0–100.0)

## 2017-06-25 ENCOUNTER — Other Ambulatory Visit: Payer: Self-pay

## 2017-06-25 ENCOUNTER — Encounter: Payer: TRICARE For Life (TFL) | Attending: Internal Medicine

## 2017-06-25 VITALS — Ht 58.25 in | Wt 132.4 lb

## 2017-06-25 DIAGNOSIS — J449 Chronic obstructive pulmonary disease, unspecified: Secondary | ICD-10-CM | POA: Diagnosis present

## 2017-06-25 NOTE — Progress Notes (Signed)
Pulmonary Individual Treatment Plan  Patient Details  Name: Bailey Flores MRN: [id number] Date of Birth: 1961-01-23 Referring Provider:     Pulmonary Rehab from 06/25/2017 in [facility] Cardiac and Pulmonary Rehab  Referring Provider  [name] MD      Initial Encounter Date:    Pulmonary Rehab from 06/25/2017 in [facility] Cardiac and Pulmonary Rehab  Date  06/25/17  Referring Provider  [name] MD      Visit Diagnosis: Chronic obstructive pulmonary disease, unspecified COPD type ([place])  Patient's Home Medications on Admission:  Current Outpatient Medications:  .  albuterol (PROVENTIL HFA;VENTOLIN HFA) 108 (90 Base) MCG/ACT inhaler, Inhale into the lungs., Disp: , Rfl:  .  buPROPion (WELLBUTRIN XL) 300 MG 24 hr tablet, Take 1 tablet (300 mg total) by mouth every morning., Disp: 30 tablet, Rfl: 1 .  ePHEDrine-GuaiFENesin 25-400 MG TABS, Take by mouth., Disp: , Rfl:  .  Fluticasone-Salmeterol (ADVAIR DISKUS) 250-50 MCG/DOSE AEPB, Inhale into the lungs., Disp: , Rfl:  .  gabapentin (NEURONTIN) 300 MG capsule, Take 300 mg 3 (three) times daily by mouth. , Disp: , Rfl:  .  ibuprofen (ADVIL,MOTRIN) 800 MG tablet, Take by mouth., Disp: , Rfl:  .  ipratropium-albuterol (DUONEB) 0.5-2.5 (3) MG/3ML SOLN, Inhale into the lungs., Disp: , Rfl:  .  lamoTRIgine (LAMICTAL) 100 MG tablet, Take by mouth., Disp: , Rfl:  .  lisdexamfetamine (VYVANSE) 70 MG capsule, Take by mouth., Disp: , Rfl:  .  losartan-hydrochlorothiazide (HYZAAR) 50-12.5 MG tablet, Take by mouth., Disp: , Rfl:  .  lubiprostone (AMITIZA) 24 MCG capsule, Take by mouth., Disp: , Rfl:  .  lurasidone (LATUDA) 40 MG TABS tablet, Take 1 tablet (40 mg total) by mouth daily with supper., Disp: 30 tablet, Rfl: 1 .  MAGNESIUM PO, Take by mouth daily., Disp: , Rfl:  .  Melatonin 10 MG TABS, Take by mouth., Disp: , Rfl:  .  montelukast (SINGULAIR) 10 MG tablet, Take by mouth., Disp: , Rfl:  .  Multiple Vitamins-Minerals (CENTRUM  SILVER PO), Take by mouth daily., Disp: , Rfl:  .  omeprazole (PRILOSEC) 40 MG capsule, Take by mouth., Disp: , Rfl:  .  Tiotropium Bromide Monohydrate (SPIRIVA RESPIMAT) 1.25 MCG/ACT AERS, Inhale into the lungs., Disp: , Rfl:  .  valACYclovir (VALTREX) 1000 MG tablet, Take by mouth., Disp: , Rfl:   Past Medical History: Past Medical History:  Diagnosis Date  . Arthritis    hands  . Asthma   . Barrett's esophagus   . Cancer [facility])    neuroendocrine ca  . COPD (chronic obstructive pulmonary disease) ([place])   . Hypertension   . Motion sickness    reading in car    Tobacco Use: Social History   Tobacco Use  Smoking Status Former Smoker  . Packs/day: 2.00  . Years: 37.00  . Pack years: 74.00  . Types: Cigarettes  . Last attempt to quit: 07/19/2012  . Years since quitting: 4.9  Smokeless Tobacco Never Used    Labs: Recent Review Scientist, physiological    Labs for ITP Cardiac and Pulmonary Rehab Latest Ref Rng & Units 06/13/2017   Cholestrol 0 - 200 mg/dL 183   LDLCALC 0 - 99 mg/dL 75   HDL >40 mg/dL 92   Trlycerides <150 mg/dL 80   Hemoglobin A1c 4.8 - 5.6 % 5.9(H)       Pulmonary Assessment Scores: Pulmonary Assessment Scores    Row Name 06/25/17 [phone]  ADL UCSD   ADL Phase  Entry     SOB Score total  43     Rest  0     Walk  2     Stairs  4     Bath  2     Dress  0     Shop  2       CAT Score   CAT Score  19       mMRC Score   mMRC Score  3        Pulmonary Function Assessment: Pulmonary Function Assessment - 06/25/17 1628      Initial Spirometry Results   FVC%  64 %    FEV1%  66 %    FEV1/FVC Ratio  79.85    Comments  good patient effort      Post Bronchodilator Spirometry Results   FVC%  62.08 %    FEV1%  64.68 %    FEV1/FVC Ratio  81.15    Comments  good patient effort      Breath   Bilateral Breath Sounds  Clear;Decreased    Shortness of Breath  Yes;Limiting activity       Exercise Target Goals: Date: 06/25/17  Exercise Program  Goal: Individual exercise prescription set using results from initial 6 min walk test and THRR while considering  patient's activity barriers and safety.    Exercise Prescription Goal: Initial exercise prescription builds to 30-45 minutes a day of aerobic activity, 2-3 days per week.  Home exercise guidelines will be given to patient during program as part of exercise prescription that the participant will acknowledge.  Activity Barriers & Risk Stratification: Activity Barriers & Cardiac Risk Stratification - 06/25/17 1652      Activity Barriers & Cardiac Risk Stratification   Activity Barriers  Arthritis;Deconditioning;Muscular Weakness;Shortness of Breath;History of Falls;Balance Concerns       6 Minute Walk: 6 Minute Walk    Row Name 06/25/17 1650         6 Minute Walk   Distance  1406 feet     Walk Time  6 minutes     # of Rest Breaks  0     MPH  2.66     METS  3.98     RPE  10     Perceived Dyspnea   1     VO2 Peak  13.94     Symptoms  Yes (comment)     Comments  leg pain (possibly sci[place]) 5/10     Resting HR  87 bpm     Resting BP  124/74     Resting Oxygen Saturation   97 %     Exercise Oxygen Saturation  during 6 min walk  91 %     Max Ex. HR  118 bpm     Max Ex. BP  162/84     2 Minute Post BP  146/74       Interval HR   1 Minute HR  100     2 Minute HR  107     3 Minute HR  111     4 Minute HR  112     5 Minute HR  114     6 Minute HR  118     2 Minute Post HR  89     Interval Heart Rate?  Yes       Interval Oxygen   Interval Oxygen?  Yes     Baseline Oxygen  Saturation %  97 %     1 Minute Oxygen Saturation %  93 %     1 Minute Liters of Oxygen  0 L Room Air     2 Minute Oxygen Saturation %  91 %     2 Minute Liters of Oxygen  0 L     3 Minute Oxygen Saturation %  94 %     3 Minute Liters of Oxygen  0 L     4 Minute Oxygen Saturation %  93 %     4 Minute Liters of Oxygen  0 L     5 Minute Oxygen Saturation %  94 %     5 Minute Liters of  Oxygen  0 L     6 Minute Oxygen Saturation %  93 %     6 Minute Liters of Oxygen  0 L     2 Minute Post Oxygen Saturation %  97 %     2 Minute Post Liters of Oxygen  0 L       Oxygen Initial Assessment: Oxygen Initial Assessment - 06/25/17 1547      Home Oxygen   Home Oxygen Device  None    Sleep Oxygen Prescription  None    Home Exercise Oxygen Prescription  None    Home at Rest Exercise Oxygen Prescription  None      Initial 6 min Walk   Oxygen Used  None      Program Oxygen Prescription   Program Oxygen Prescription  None      Intervention   Short Term Goals  To learn and understand importance of maintaining oxygen saturations>88%;To learn and demonstrate proper use of respiratory medications;To learn and demonstrate proper pursed lip breathing techniques or other breathing techniques.;To learn and understand importance of monitoring SPO2 with pulse oximeter and demonstrate accurate use of the pulse oximeter.    Long  Term Goals  Verbalizes importance of monitoring SPO2 with pulse oximeter and return demonstration;Maintenance of O2 saturations>88%;Exhibits proper breathing techniques, such as pursed lip breathing or other method taught during program session;Compliance with respiratory medication;Demonstrates proper use of MDI's       Oxygen Re-Evaluation:   Oxygen Discharge (Final Oxygen Re-Evaluation):   Initial Exercise Prescription: Initial Exercise Prescription - 06/25/17 1600      Date of Initial Exercise RX and Referring Provider   Date  06/25/17    Referring Provider  [name] MD      Treadmill   MPH  2.6    Grade  0.5    Minutes  15    METs  3.17      NuStep   Level  3    SPM  80    Minutes  15    METs  2.5      Recumbant Elliptical   Level  1    RPM  50    Minutes  15    METs  2.5      Prescription Details   Frequency (times per week)  3    Duration  Progress to 45 minutes of aerobic exercise without signs/symptoms of physical  distress      Intensity   THRR 40-80% of Max Heartrate  117-146    Ratings of Perceived Exertion  11-13    Perceived Dyspnea  0-4      Progression   Progression  Continue to progress workloads to maintain intensity without signs/symptoms of physical distress.  Resistance Training   Training Prescription  Yes    Weight  3 lbs    Reps  10-15       Perform Capillary Blood Glucose checks as needed.  Exercise Prescription Changes: Exercise Prescription Changes    Row Name 06/25/17 1600             Response to Exercise   Blood Pressure (Admit)  124/74       Blood Pressure (Exercise)  162/84       Blood Pressure (Exit)  146/74       Heart Rate (Admit)  87 bpm       Heart Rate (Exercise)  118 bpm       Heart Rate (Exit)  89 bpm       Oxygen Saturation (Admit)  97 %       Oxygen Saturation (Exercise)  91 %       Oxygen Saturation (Exit)  97 %       Rating of Perceived Exertion (Exercise)  10       Perceived Dyspnea (Exercise)  1       Symptoms  leg pain       Comments  walk test results          Exercise Comments:   Exercise Goals and Review: Exercise Goals    Row Name 06/25/17 1655             Exercise Goals   Increase Physical Activity  Yes       Intervention  Provide advice, education, support and counseling about physical activity/exercise needs.;Develop an individualized exercise prescription for aerobic and resistive training based on initial evaluation findings, risk stratification, comorbidities and participant's personal goals.       Expected Outcomes  Short Term: Attend rehab on a regular basis to increase amount of physical activity.;Long Term: Add in home exercise to make exercise part of routine and to increase amount of physical activity.;Long Term: Exercising regularly at least 3-5 days a week.       Increase Strength and Stamina  Yes       Intervention  Provide advice, education, support and counseling about physical activity/exercise  needs.;Develop an individualized exercise prescription for aerobic and resistive training based on initial evaluation findings, risk stratification, comorbidities and participant's personal goals.       Expected Outcomes  Short Term: Increase workloads from initial exercise prescription for resistance, speed, and METs.;Short Term: Perform resistance training exercises routinely during rehab and add in resistance training at home;Long Term: Improve cardiorespiratory fitness, muscular endurance and strength as measured by increased METs and functional capacity (6MWT)       Able to understand and use rate of perceived exertion (RPE) scale  Yes       Intervention  Provide education and explanation on how to use RPE scale       Expected Outcomes  Short Term: Able to use RPE daily in rehab to express subjective intensity level;Long Term:  Able to use RPE to guide intensity level when exercising independently       Able to understand and use Dyspnea scale  Yes       Intervention  Provide education and explanation on how to use Dyspnea scale       Expected Outcomes  Short Term: Able to use Dyspnea scale daily in rehab to express subjective sense of shortness of breath during exertion;Long Term: Able to use Dyspnea scale to guide intensity level when exercising independently  Knowledge and understanding of Target Heart Rate Range (THRR)  Yes       Intervention  Provide education and explanation of THRR including how the numbers were predicted and where they are located for reference       Expected Outcomes  Short Term: Able to state/look up THRR;Long Term: Able to use THRR to govern intensity when exercising independently;Short Term: Able to use daily as guideline for intensity in rehab       Able to check pulse independently  Yes       Intervention  Provide education and demonstration on how to check pulse in carotid and radial arteries.;Review the importance of being able to check your own pulse for safety  during independent exercise       Expected Outcomes  Short Term: Able to explain why pulse checking is important during independent exercise;Long Term: Able to check pulse independently and accurately       Understanding of Exercise Prescription  Yes       Intervention  Provide education, explanation, and written materials on patient's individual exercise prescription       Expected Outcomes  Long Term: Able to explain home exercise prescription to exercise independently;Short Term: Able to explain program exercise prescription          Exercise Goals Re-Evaluation :   Discharge Exercise Prescription (Final Exercise Prescription Changes): Exercise Prescription Changes - 06/25/17 1600      Response to Exercise   Blood Pressure (Admit)  124/74    Blood Pressure (Exercise)  162/84    Blood Pressure (Exit)  146/74    Heart Rate (Admit)  87 bpm    Heart Rate (Exercise)  118 bpm    Heart Rate (Exit)  89 bpm    Oxygen Saturation (Admit)  97 %    Oxygen Saturation (Exercise)  91 %    Oxygen Saturation (Exit)  97 %    Rating of Perceived Exertion (Exercise)  10    Perceived Dyspnea (Exercise)  1    Symptoms  leg pain    Comments  walk test results       Nutrition:  Target Goals: Understanding of nutrition guidelines, daily intake of sodium <1537m, cholesterol <2062m calories 30% from fat and 7% or less from saturated fats, daily to have 5 or more servings of fruits and vegetables.  Biometrics: Pre Biometrics - 06/25/17 1655      Pre Biometrics   Height  4' 10.25" (1.48 m)    Weight  132 lb 6.4 oz (60.1 kg)    Waist Circumference  32.5 inches    Hip Circumference  38 inches    Waist to Hip Ratio  0.86 %    BMI (Calculated)  27.42        Nutrition Therapy Plan and Nutrition Goals: Nutrition Therapy & Goals - 06/25/17 1503      Personal Nutrition Goals   Comments  would like to lose weight and eat healthier      Intervention Plan   Intervention  Prescribe, educate and  counsel regarding individualized specific dietary modifications aiming towards targeted core components such as weight, hypertension, lipid management, diabetes, heart failure and other comorbidities.;Nutrition handout(s) given to patient.    Expected Outcomes  Short Term Goal: Understand basic principles of dietary content, such as calories, fat, sodium, cholesterol and nutrients.;Long Term Goal: Adherence to prescribed nutrition plan.       Nutrition Assessments: Nutrition Assessments - 06/25/17 1501  MEDFICTS Scores   Pre Score  107       Nutrition Goals Re-Evaluation:   Nutrition Goals Discharge (Final Nutrition Goals Re-Evaluation):   Psychosocial: Target Goals: Acknowledge presence or absence of significant depression and/or stress, maximize coping skills, provide positive support system. Participant is able to verbalize types and ability to use techniques and skills needed for reducing stress and depression.   Initial Review & Psychosocial Screening: Initial Psych Review & Screening - 06/25/17 1455      Initial Review   Current issues with  Current Depression;History of Depression;Current Psychotropic Meds;Current Sleep Concerns;Current Stress Concerns    Source of Stress Concerns  Poor Coping Skills;Family;Financial;Transportation;Occupation;Retirement/disability    Comments  she says she worries about everything.      Family Dynamics   Good Support System?  Yes    Comments  Her and Her husband support each other.      Barriers   Psychosocial barriers to participate in program  The patient should benefit from training in stress management and relaxation.      Screening Interventions   Interventions  To provide support and resources with identified psychosocial needs;Program counselor consult;Encouraged to exercise;Provide feedback about the scores to participant    Expected Outcomes  Short Term goal: Utilizing psychosocial counselor, staff and physician to assist  with identification of specific Stressors or current issues interfering with healing process. Setting desired goal for each stressor or current issue identified.;Long Term goal: The participant improves quality of Life and PHQ9 Scores as seen by post scores and/or verbalization of changes;Short Term goal: Identification and review with participant of any Quality of Life or Depression concerns found by scoring the questionnaire.;Long Term Goal: Stressors or current issues are controlled or eliminated.       Quality of Life Scores:  Scores of 19 and below usually indicate a poorer quality of life in these areas.  A difference of  2-3 points is a clinically meaningful difference.  A difference of 2-3 points in the total score of the Quality of Life Index has been associated with significant improvement in overall quality of life, self-image, physical symptoms, and general health in studies assessing change in quality of life.  PHQ-9: Recent Review Flowsheet Data    Depression screen [facility] 2/9 06/25/2017   Decreased Interest 2   Down, Depressed, Hopeless 2   PHQ - 2 Score 4   Altered sleeping 3   Tired, decreased energy 3   Change in appetite 3   Feeling bad or failure about yourself  2   Trouble concentrating 2   Moving slowly or fidgety/restless 2   Suicidal thoughts 0   PHQ-9 Score 19   Difficult doing work/chores Somewhat difficult     Interpretation of Total Score  Total Score Depression Severity:  1-4 = Minimal depression, 5-9 = Mild depression, 10-14 = Moderate depression, 15-19 = Moderately severe depression, 20-27 = Severe depression   Psychosocial Evaluation and Intervention:   Psychosocial Re-Evaluation:   Psychosocial Discharge (Final Psychosocial Re-Evaluation):   Education: Education Goals: Education classes will be provided on a weekly basis, covering required topics. Participant will state understanding/return demonstration of topics presented.  Learning  Barriers/Preferences: Learning Barriers/Preferences - 06/25/17 1508      Learning Barriers/Preferences   Learning Barriers  Sight reviewed with patient    Learning Preferences  None       Education Topics:  Initial Evaluation Education: - Verbal, written and demonstration of respiratory meds, oximetry and breathing techniques. Instruction on  use of nebulizers and MDIs and importance of monitoring MDI activations.   Pulmonary Rehab from 06/25/2017 in [facility] Cardiac and Pulmonary Rehab  Date  06/25/17  Educator  [facility]  Instruction Review Code  1- Verbalizes Understanding      General Nutrition Guidelines/Fats and Fiber: -Group instruction provided by verbal, written material, models and posters to present the general guidelines for heart healthy nutrition. Gives an explanation and review of dietary fats and fiber.   Controlling Sodium/Reading Food Labels: -Group verbal and written material supporting the discussion of sodium use in heart healthy nutrition. Review and explanation with models, verbal and written materials for utilization of the food label.   Exercise Physiology & General Exercise Guidelines: - Group verbal and written instruction with models to review the exercise physiology of the cardiovascular system and associated critical values. Provides general exercise guidelines with specific guidelines to those with heart or lung disease.    Aerobic Exercise & Resistance Training: - Gives group verbal and written instruction on the various components of exercise. Focuses on aerobic and resistive training programs and the benefits of this training and how to safely progress through these programs.   Flexibility, Balance, Mind/Body Relaxation: Provides group verbal/written instruction on the benefits of flexibility and balance training, including mind/body exercise modes such as yoga, pilates and tai chi.  Demonstration and skill practice provided.   Stress and Anxiety: -  Provides group verbal and written instruction about the health risks of elevated stress and causes of high stress.  Discuss the correlation between heart/lung disease and anxiety and treatment options. Review healthy ways to manage with stress and anxiety.   Depression: - Provides group verbal and written instruction on the correlation between heart/lung disease and depressed mood, treatment options, and the stigmas associated with seeking treatment.   Exercise & Equipment Safety: - Individual verbal instruction and demonstration of equipment use and safety with use of the equipment.   Pulmonary Rehab from 06/25/2017 in [facility] Cardiac and Pulmonary Rehab  Date  06/25/17  Educator  [facility]  Instruction Review Code  1- Verbalizes Understanding      Infection Prevention: - Provides verbal and written material to individual with discussion of infection control including proper hand washing and proper equipment cleaning during exercise session.   Pulmonary Rehab from 06/25/2017 in [facility] Cardiac and Pulmonary Rehab  Date  06/25/17  Educator  [facility]  Instruction Review Code  1- Verbalizes Understanding      Falls Prevention: - Provides verbal and written material to individual with discussion of falls prevention and safety.   Pulmonary Rehab from 06/25/2017 in [facility] Cardiac and Pulmonary Rehab  Date  06/25/17  Educator  [facility]  Instruction Review Code  1- Verbalizes Understanding      Diabetes: - Individual verbal and written instruction to review signs/symptoms of diabetes, desired ranges of glucose level fasting, after meals and with exercise. Advice that pre and post exercise glucose checks will be done for 3 sessions at entry of program.   Chronic Lung Diseases: - Group verbal and written instruction to review updates, respiratory medications, advancements in procedures and treatments. Discuss use of supplemental oxygen including available portable oxygen systems, continuous and intermittent flow rates,  concentrators, personal use and safety guidelines. Review proper use of inhaler and spacers. Provide informative websites for self-education.    Energy Conservation: - Provide group verbal and written instruction for methods to conserve energy, plan and organize activities. Instruct on pacing techniques, use of adaptive equipment and posture/positioning to  relieve shortness of breath.   Triggers and Exacerbations: - Group verbal and written instruction to review types of environmental triggers and ways to prevent exacerbations. Discuss weather changes, air quality and the benefits of nasal washing. Review warning signs and symptoms to help prevent infections. Discuss techniques for effective airway clearance, coughing, and vibrations.   AED/CPR: - Group verbal and written instruction with the use of models to demonstrate the basic use of the AED with the basic ABC's of resuscitation.   Anatomy and Physiology of the Lungs: - Group verbal and written instruction with the use of models to provide basic lung anatomy and physiology related to function, structure and complications of lung disease.   Anatomy & Physiology of the Heart: - Group verbal and written instruction and models provide basic cardiac anatomy and physiology, with the coronary electrical and arterial systems. Review of Valvular disease and Heart Failure   Cardiac Medications: - Group verbal and written instruction to review commonly prescribed medications for heart disease. Reviews the medication, class of the drug, and side effects.   Know Your Numbers and Risk Factors: -Group verbal and written instruction about important numbers in your health.  Discussion of what are risk factors and how they play a role in the disease process.  Review of Cholesterol, Blood Pressure, Diabetes, and BMI and the role they play in your overall health.   Sleep Hygiene: -Provides group verbal and written instruction about how sleep can  affect your health.  Define sleep hygiene, discuss sleep cycles and impact of sleep habits. Review good sleep hygiene tips.    Other: -Provides group and verbal instruction on various topics (see comments)    Knowledge Questionnaire Score: Knowledge Questionnaire Score - 06/25/17 1508      Knowledge Questionnaire Score   Pre Score  13/18 reviewed with patient        Core Components/Risk Factors/Patient Goals at Admission: Personal Goals and Risk Factors at Admission - 06/25/17 1547      Core Components/Risk Factors/Patient Goals on Admission    Weight Management  Yes;Weight Loss    Intervention  Weight Management: Develop a combined nutrition and exercise program designed to reach desired caloric intake, while maintaining appropriate intake of nutrient and fiber, sodium and fats, and appropriate energy expenditure required for the weight goal.;Weight Management: Provide education and appropriate resources to help participant work on and attain dietary goals.;Weight Management/Obesity: Establish reasonable short term and long term weight goals.    Admit Weight  132 lb 6.4 oz (60.1 kg)    Goal Weight: Short Term  127 lb (57.6 kg)    Goal Weight: Long Term  120 lb (54.4 kg)    Expected Outcomes  Long Term: Adherence to nutrition and physical activity/exercise program aimed toward attainment of established weight goal;Weight Loss: Understanding of general recommendations for a balanced deficit meal plan, which promotes 1-2 lb weight loss per week and includes a negative energy balance of [phone] kcal/d;Understanding recommendations for meals to include 15-35% energy as protein, 25-35% energy from fat, 35-60% energy from carbohydrates, less than 243m of dietary cholesterol, 20-35 gm of total fiber daily;Understanding of distribution of calorie intake throughout the day with the consumption of 4-5 meals/snacks;Weight Maintenance: Understanding of the daily nutrition guidelines, which includes  25-35% calories from fat, 7% or less cal from saturated fats, less than 2049mcholesterol, less than 1.5gm of sodium, & 5 or more servings of fruits and vegetables daily    Improve shortness of breath with ADL's  Yes    Intervention  Provide education, individualized exercise plan and daily activity instruction to help decrease symptoms of SOB with activities of daily living.    Expected Outcomes  Short Term: Improve cardiorespiratory fitness to achieve a reduction of symptoms when performing ADLs    Hypertension  Yes    Intervention  Provide education on lifestyle modifcations including regular physical activity/exercise, weight management, moderate sodium restriction and increased consumption of fresh fruit, vegetables, and low fat dairy, alcohol moderation, and smoking cessation.;Monitor prescription use compliance.    Expected Outcomes  Short Term: Continued assessment and intervention until BP is < 140/45m HG in hypertensive participants. < 130/87mHG in hypertensive participants with diabetes, heart failure or chronic kidney disease.;Long Term: Maintenance of blood pressure at goal levels.       Core Components/Risk Factors/Patient Goals Review:    Core Components/Risk Factors/Patient Goals at Discharge (Final Review):    ITP Comments: ITP Comments    Row Name 06/25/17 1416           ITP Comments  Medical Evaluation completed. Chart sent for review and changes to Dr. MaEmily Filbertirector of [facility] can be found in [facility]          Comments: Initial ITP

## 2017-06-25 NOTE — Patient Instructions (Signed)
Patient Instructions  Patient Details  Name: Bailey Flores MRN: [id number] Date of Birth: 24-Jul-1960 Referring Provider:  [name], *  Below are your personal goals for exercise, nutrition, and risk factors. Our goal is to help you stay on track towards obtaining and maintaining these goals. We will be discussing your progress on these goals with you throughout the program.  Initial Exercise Prescription: Initial Exercise Prescription - 06/25/17 1600      Date of Initial Exercise RX and Referring Provider   Date  06/25/17    Referring Provider  [name] MD      Treadmill   MPH  2.6    Grade  0.5    Minutes  15    METs  3.17      NuStep   Level  3    SPM  80    Minutes  15    METs  2.5      Recumbant Elliptical   Level  1    RPM  50    Minutes  15    METs  2.5      Prescription Details   Frequency (times per week)  3    Duration  Progress to 45 minutes of aerobic exercise without signs/symptoms of physical distress      Intensity   THRR 40-80% of Max Heartrate  117-146    Ratings of Perceived Exertion  11-13    Perceived Dyspnea  0-4      Progression   Progression  Continue to progress workloads to maintain intensity without signs/symptoms of physical distress.      Resistance Training   Training Prescription  Yes    Weight  3 lbs    Reps  10-15       Exercise Goals: Frequency: Be able to perform aerobic exercise two to three times per week in program working toward 2-5 days per week of home exercise.  Intensity: Work with a perceived exertion of 11 (fairly light) - 15 (hard) while following your exercise prescription.  We will make changes to your prescription with you as you progress through the program.   Duration: Be able to do 30 to 45 minutes of continuous aerobic exercise in addition to a 5 minute warm-up and a 5 minute cool-down routine.   Nutrition Goals: Your personal nutrition goals will be established when you do your  nutrition analysis with the dietician.  The following are general nutrition guidelines to follow: Cholesterol < 200mg /day Sodium < 1500mg /day Fiber: Women over 50 yrs - 21 grams per day  Personal Goals: Personal Goals and Risk Factors at Admission - 06/25/17 1547      Core Components/Risk Factors/Patient Goals on Admission    Weight Management  Yes;Weight Loss    Intervention  Weight Management: Develop a combined nutrition and exercise program designed to reach desired caloric intake, while maintaining appropriate intake of nutrient and fiber, sodium and fats, and appropriate energy expenditure required for the weight goal.;Weight Management: Provide education and appropriate resources to help participant work on and attain dietary goals.;Weight Management/Obesity: Establish reasonable short term and long term weight goals.    Admit Weight  132 lb 6.4 oz (60.1 kg)    Goal Weight: Short Term  127 lb (57.6 kg)    Goal Weight: Long Term  120 lb (54.4 kg)    Expected Outcomes  Long Term: Adherence to nutrition and physical activity/exercise program aimed toward attainment of established weight goal;Weight Loss: Understanding of general recommendations for  a balanced deficit meal plan, which promotes 1-2 lb weight loss per week and includes a negative energy balance of [phone] kcal/d;Understanding recommendations for meals to include 15-35% energy as protein, 25-35% energy from fat, 35-60% energy from carbohydrates, less than 200mg  of dietary cholesterol, 20-35 gm of total fiber daily;Understanding of distribution of calorie intake throughout the day with the consumption of 4-5 meals/snacks;Weight Maintenance: Understanding of the daily nutrition guidelines, which includes 25-35% calories from fat, 7% or less cal from saturated fats, less than 200mg  cholesterol, less than 1.5gm of sodium, & 5 or more servings of fruits and vegetables daily    Improve shortness of breath with ADL's  Yes    Intervention   Provide education, individualized exercise plan and daily activity instruction to help decrease symptoms of SOB with activities of daily living.    Expected Outcomes  Short Term: Improve cardiorespiratory fitness to achieve a reduction of symptoms when performing ADLs    Hypertension  Yes    Intervention  Provide education on lifestyle modifcations including regular physical activity/exercise, weight management, moderate sodium restriction and increased consumption of fresh fruit, vegetables, and low fat dairy, alcohol moderation, and smoking cessation.;Monitor prescription use compliance.    Expected Outcomes  Short Term: Continued assessment and intervention until BP is < 140/71mm HG in hypertensive participants. < 130/26mm HG in hypertensive participants with diabetes, heart failure or chronic kidney disease.;Long Term: Maintenance of blood pressure at goal levels.       Tobacco Use Initial Evaluation: Social History   Tobacco Use  Smoking Status Former Smoker  . Packs/day: 2.00  . Years: 37.00  . Pack years: 74.00  . Types: Cigarettes  . Last attempt to quit: 07/19/2012  . Years since quitting: 4.9  Smokeless Tobacco Never Used    Exercise Goals and Review: Exercise Goals    Row Name 06/25/17 1655             Exercise Goals   Increase Physical Activity  Yes       Intervention  Provide advice, education, support and counseling about physical activity/exercise needs.;Develop an individualized exercise prescription for aerobic and resistive training based on initial evaluation findings, risk stratification, comorbidities and participant's personal goals.       Expected Outcomes  Short Term: Attend rehab on a regular basis to increase amount of physical activity.;Long Term: Add in home exercise to make exercise part of routine and to increase amount of physical activity.;Long Term: Exercising regularly at least 3-5 days a week.       Increase Strength and Stamina  Yes        Intervention  Provide advice, education, support and counseling about physical activity/exercise needs.;Develop an individualized exercise prescription for aerobic and resistive training based on initial evaluation findings, risk stratification, comorbidities and participant's personal goals.       Expected Outcomes  Short Term: Increase workloads from initial exercise prescription for resistance, speed, and METs.;Short Term: Perform resistance training exercises routinely during rehab and add in resistance training at home;Long Term: Improve cardiorespiratory fitness, muscular endurance and strength as measured by increased METs and functional capacity (6MWT)       Able to understand and use rate of perceived exertion (RPE) scale  Yes       Intervention  Provide education and explanation on how to use RPE scale       Expected Outcomes  Short Term: Able to use RPE daily in rehab to express subjective intensity level;Long Term:  Able to  use RPE to guide intensity level when exercising independently       Able to understand and use Dyspnea scale  Yes       Intervention  Provide education and explanation on how to use Dyspnea scale       Expected Outcomes  Short Term: Able to use Dyspnea scale daily in rehab to express subjective sense of shortness of breath during exertion;Long Term: Able to use Dyspnea scale to guide intensity level when exercising independently       Knowledge and understanding of Target Heart Rate Range (THRR)  Yes       Intervention  Provide education and explanation of THRR including how the numbers were predicted and where they are located for reference       Expected Outcomes  Short Term: Able to state/look up THRR;Long Term: Able to use THRR to govern intensity when exercising independently;Short Term: Able to use daily as guideline for intensity in rehab       Able to check pulse independently  Yes       Intervention  Provide education and demonstration on how to check pulse in  carotid and radial arteries.;Review the importance of being able to check your own pulse for safety during independent exercise       Expected Outcomes  Short Term: Able to explain why pulse checking is important during independent exercise;Long Term: Able to check pulse independently and accurately       Understanding of Exercise Prescription  Yes       Intervention  Provide education, explanation, and written materials on patient's individual exercise prescription       Expected Outcomes  Long Term: Able to explain home exercise prescription to exercise independently;Short Term: Able to explain program exercise prescription          Copy of goals given to participant.

## 2017-07-02 DIAGNOSIS — J449 Chronic obstructive pulmonary disease, unspecified: Secondary | ICD-10-CM

## 2017-07-02 NOTE — Progress Notes (Signed)
Pulmonary Individual Treatment Plan  Patient Details  Name: Bailey Flores MRN: [id number] Date of Birth: 03-17-1961 Referring Provider:     Pulmonary Rehab from 06/25/2017 in [facility] Cardiac and Pulmonary Rehab  Referring Provider  [name] MD      Initial Encounter Date:    Pulmonary Rehab from 06/25/2017 in [facility] Cardiac and Pulmonary Rehab  Date  06/25/17  Referring Provider  [name] MD      Visit Diagnosis: Chronic obstructive pulmonary disease, unspecified COPD type ([place])  Patient's Home Medications on Admission:  Current Outpatient Medications:  .  albuterol (PROVENTIL HFA;VENTOLIN HFA) 108 (90 Base) MCG/ACT inhaler, Inhale into the lungs., Disp: , Rfl:  .  buPROPion (WELLBUTRIN XL) 300 MG 24 hr tablet, Take 1 tablet (300 mg total) by mouth every morning., Disp: 30 tablet, Rfl: 1 .  ePHEDrine-GuaiFENesin 25-400 MG TABS, Take by mouth., Disp: , Rfl:  .  Fluticasone-Salmeterol (ADVAIR DISKUS) 250-50 MCG/DOSE AEPB, Inhale into the lungs., Disp: , Rfl:  .  gabapentin (NEURONTIN) 300 MG capsule, Take 300 mg 3 (three) times daily by mouth. , Disp: , Rfl:  .  ibuprofen (ADVIL,MOTRIN) 800 MG tablet, Take by mouth., Disp: , Rfl:  .  ipratropium-albuterol (DUONEB) 0.5-2.5 (3) MG/3ML SOLN, Inhale into the lungs., Disp: , Rfl:  .  lamoTRIgine (LAMICTAL) 100 MG tablet, Take by mouth., Disp: , Rfl:  .  lisdexamfetamine (VYVANSE) 70 MG capsule, Take by mouth., Disp: , Rfl:  .  losartan-hydrochlorothiazide (HYZAAR) 50-12.5 MG tablet, Take by mouth., Disp: , Rfl:  .  lubiprostone (AMITIZA) 24 MCG capsule, Take by mouth., Disp: , Rfl:  .  lurasidone (LATUDA) 40 MG TABS tablet, Take 1 tablet (40 mg total) by mouth daily with supper., Disp: 30 tablet, Rfl: 1 .  MAGNESIUM PO, Take by mouth daily., Disp: , Rfl:  .  Melatonin 10 MG TABS, Take by mouth., Disp: , Rfl:  .  montelukast (SINGULAIR) 10 MG tablet, Take by mouth., Disp: , Rfl:  .  Multiple Vitamins-Minerals (CENTRUM  SILVER PO), Take by mouth daily., Disp: , Rfl:  .  omeprazole (PRILOSEC) 40 MG capsule, Take by mouth., Disp: , Rfl:  .  Tiotropium Bromide Monohydrate (SPIRIVA RESPIMAT) 1.25 MCG/ACT AERS, Inhale into the lungs., Disp: , Rfl:  .  valACYclovir (VALTREX) 1000 MG tablet, Take by mouth., Disp: , Rfl:   Past Medical History: Past Medical History:  Diagnosis Date  . Arthritis    hands  . Asthma   . Barrett's esophagus   . Cancer [facility])    neuroendocrine ca  . COPD (chronic obstructive pulmonary disease) ([place])   . Hypertension   . Motion sickness    reading in car    Tobacco Use: Social History   Tobacco Use  Smoking Status Former Smoker  . Packs/day: 2.00  . Years: 37.00  . Pack years: 74.00  . Types: Cigarettes  . Last attempt to quit: 07/19/2012  . Years since quitting: 4.9  Smokeless Tobacco Never Used    Labs: Recent Review Scientist, physiological    Labs for ITP Cardiac and Pulmonary Rehab Latest Ref Rng & Units 06/13/2017   Cholestrol 0 - 200 mg/dL 183   LDLCALC 0 - 99 mg/dL 75   HDL >40 mg/dL 92   Trlycerides <150 mg/dL 80   Hemoglobin A1c 4.8 - 5.6 % 5.9(H)       Pulmonary Assessment Scores: Pulmonary Assessment Scores    Row Name 06/25/17 [phone]  ADL [facility]   ADL Phase  Entry     SOB Score total  43     Rest  0     Walk  2     Stairs  4     Bath  2     Dress  0     Shop  2       CAT Score   CAT Score  19       mMRC Score   mMRC Score  3        Pulmonary Function Assessment: Pulmonary Function Assessment - 06/25/17 1628      Initial Spirometry Results   FVC%  64 %    FEV1%  66 %    FEV1/FVC Ratio  79.85    Comments  good patient effort      Post Bronchodilator Spirometry Results   FVC%  62.08 %    FEV1%  64.68 %    FEV1/FVC Ratio  81.15    Comments  good patient effort      Breath   Bilateral Breath Sounds  Clear;Decreased    Shortness of Breath  Yes;Limiting activity       Exercise Target Goals:    Exercise Program  Goal: Individual exercise prescription set using results from initial 6 min walk test and THRR while considering  patient's activity barriers and safety.    Exercise Prescription Goal: Initial exercise prescription builds to 30-45 minutes a day of aerobic activity, 2-3 days per week.  Home exercise guidelines will be given to patient during program as part of exercise prescription that the participant will acknowledge.  Activity Barriers & Risk Stratification: Activity Barriers & Cardiac Risk Stratification - 06/25/17 1652      Activity Barriers & Cardiac Risk Stratification   Activity Barriers  Arthritis;Deconditioning;Muscular Weakness;Shortness of Breath;History of Falls;Balance Concerns       6 Minute Walk: 6 Minute Walk    Row Name 06/25/17 1650         6 Minute Walk   Distance  1406 feet     Walk Time  6 minutes     # of Rest Breaks  0     MPH  2.66     METS  3.98     RPE  10     Perceived Dyspnea   1     VO2 Peak  13.94     Symptoms  Yes (comment)     Comments  leg pain (possibly sci[place]) 5/10     Resting HR  87 bpm     Resting BP  124/74     Resting Oxygen Saturation   97 %     Exercise Oxygen Saturation  during 6 min walk  91 %     Max Ex. HR  118 bpm     Max Ex. BP  162/84     2 Minute Post BP  146/74       Interval HR   1 Minute HR  100     2 Minute HR  107     3 Minute HR  111     4 Minute HR  112     5 Minute HR  114     6 Minute HR  118     2 Minute Post HR  89     Interval Heart Rate?  Yes       Interval Oxygen   Interval Oxygen?  Yes     Baseline Oxygen  Saturation %  97 %     1 Minute Oxygen Saturation %  93 %     1 Minute Liters of Oxygen  0 L Room Air     2 Minute Oxygen Saturation %  91 %     2 Minute Liters of Oxygen  0 L     3 Minute Oxygen Saturation %  94 %     3 Minute Liters of Oxygen  0 L     4 Minute Oxygen Saturation %  93 %     4 Minute Liters of Oxygen  0 L     5 Minute Oxygen Saturation %  94 %     5 Minute Liters of  Oxygen  0 L     6 Minute Oxygen Saturation %  93 %     6 Minute Liters of Oxygen  0 L     2 Minute Post Oxygen Saturation %  97 %     2 Minute Post Liters of Oxygen  0 L       Oxygen Initial Assessment: Oxygen Initial Assessment - 06/25/17 1547      Home Oxygen   Home Oxygen Device  None    Sleep Oxygen Prescription  None    Home Exercise Oxygen Prescription  None    Home at Rest Exercise Oxygen Prescription  None      Initial 6 min Walk   Oxygen Used  None      Program Oxygen Prescription   Program Oxygen Prescription  None      Intervention   Short Term Goals  To learn and understand importance of maintaining oxygen saturations>88%;To learn and demonstrate proper use of respiratory medications;To learn and demonstrate proper pursed lip breathing techniques or other breathing techniques.;To learn and understand importance of monitoring SPO2 with pulse oximeter and demonstrate accurate use of the pulse oximeter.    Long  Term Goals  Verbalizes importance of monitoring SPO2 with pulse oximeter and return demonstration;Maintenance of O2 saturations>88%;Exhibits proper breathing techniques, such as pursed lip breathing or other method taught during program session;Compliance with respiratory medication;Demonstrates proper use of MDI's       Oxygen Re-Evaluation:   Oxygen Discharge (Final Oxygen Re-Evaluation):   Initial Exercise Prescription: Initial Exercise Prescription - 06/25/17 1600      Date of Initial Exercise RX and Referring Provider   Date  06/25/17    Referring Provider  [name] MD      Treadmill   MPH  2.6    Grade  0.5    Minutes  15    METs  3.17      NuStep   Level  3    SPM  80    Minutes  15    METs  2.5      Recumbant Elliptical   Level  1    RPM  50    Minutes  15    METs  2.5      Prescription Details   Frequency (times per week)  3    Duration  Progress to 45 minutes of aerobic exercise without signs/symptoms of physical  distress      Intensity   THRR 40-80% of Max Heartrate  117-146    Ratings of Perceived Exertion  11-13    Perceived Dyspnea  0-4      Progression   Progression  Continue to progress workloads to maintain intensity without signs/symptoms of physical distress.  Resistance Training   Training Prescription  Yes    Weight  3 lbs    Reps  10-15       Perform Capillary Blood Glucose checks as needed.  Exercise Prescription Changes: Exercise Prescription Changes    Row Name 06/25/17 1600             Response to Exercise   Blood Pressure (Admit)  124/74       Blood Pressure (Exercise)  162/84       Blood Pressure (Exit)  146/74       Heart Rate (Admit)  87 bpm       Heart Rate (Exercise)  118 bpm       Heart Rate (Exit)  89 bpm       Oxygen Saturation (Admit)  97 %       Oxygen Saturation (Exercise)  91 %       Oxygen Saturation (Exit)  97 %       Rating of Perceived Exertion (Exercise)  10       Perceived Dyspnea (Exercise)  1       Symptoms  leg pain       Comments  walk test results          Exercise Comments:   Exercise Goals and Review: Exercise Goals    Row Name 06/25/17 1655             Exercise Goals   Increase Physical Activity  Yes       Intervention  Provide advice, education, support and counseling about physical activity/exercise needs.;Develop an individualized exercise prescription for aerobic and resistive training based on initial evaluation findings, risk stratification, comorbidities and participant's personal goals.       Expected Outcomes  Short Term: Attend rehab on a regular basis to increase amount of physical activity.;Long Term: Add in home exercise to make exercise part of routine and to increase amount of physical activity.;Long Term: Exercising regularly at least 3-5 days a week.       Increase Strength and Stamina  Yes       Intervention  Provide advice, education, support and counseling about physical activity/exercise  needs.;Develop an individualized exercise prescription for aerobic and resistive training based on initial evaluation findings, risk stratification, comorbidities and participant's personal goals.       Expected Outcomes  Short Term: Increase workloads from initial exercise prescription for resistance, speed, and METs.;Short Term: Perform resistance training exercises routinely during rehab and add in resistance training at home;Long Term: Improve cardiorespiratory fitness, muscular endurance and strength as measured by increased METs and functional capacity (6MWT)       Able to understand and use rate of perceived exertion (RPE) scale  Yes       Intervention  Provide education and explanation on how to use RPE scale       Expected Outcomes  Short Term: Able to use RPE daily in rehab to express subjective intensity level;Long Term:  Able to use RPE to guide intensity level when exercising independently       Able to understand and use Dyspnea scale  Yes       Intervention  Provide education and explanation on how to use Dyspnea scale       Expected Outcomes  Short Term: Able to use Dyspnea scale daily in rehab to express subjective sense of shortness of breath during exertion;Long Term: Able to use Dyspnea scale to guide intensity level when exercising independently  Knowledge and understanding of Target Heart Rate Range (THRR)  Yes       Intervention  Provide education and explanation of THRR including how the numbers were predicted and where they are located for reference       Expected Outcomes  Short Term: Able to state/look up THRR;Long Term: Able to use THRR to govern intensity when exercising independently;Short Term: Able to use daily as guideline for intensity in rehab       Able to check pulse independently  Yes       Intervention  Provide education and demonstration on how to check pulse in carotid and radial arteries.;Review the importance of being able to check your own pulse for safety  during independent exercise       Expected Outcomes  Short Term: Able to explain why pulse checking is important during independent exercise;Long Term: Able to check pulse independently and accurately       Understanding of Exercise Prescription  Yes       Intervention  Provide education, explanation, and written materials on patient's individual exercise prescription       Expected Outcomes  Long Term: Able to explain home exercise prescription to exercise independently;Short Term: Able to explain program exercise prescription          Exercise Goals Re-Evaluation :   Discharge Exercise Prescription (Final Exercise Prescription Changes): Exercise Prescription Changes - 06/25/17 1600      Response to Exercise   Blood Pressure (Admit)  124/74    Blood Pressure (Exercise)  162/84    Blood Pressure (Exit)  146/74    Heart Rate (Admit)  87 bpm    Heart Rate (Exercise)  118 bpm    Heart Rate (Exit)  89 bpm    Oxygen Saturation (Admit)  97 %    Oxygen Saturation (Exercise)  91 %    Oxygen Saturation (Exit)  97 %    Rating of Perceived Exertion (Exercise)  10    Perceived Dyspnea (Exercise)  1    Symptoms  leg pain    Comments  walk test results       Nutrition:  Target Goals: Understanding of nutrition guidelines, daily intake of sodium <1576m, cholesterol <2064m calories 30% from fat and 7% or less from saturated fats, daily to have 5 or more servings of fruits and vegetables.  Biometrics: Pre Biometrics - 06/25/17 1655      Pre Biometrics   Height  4' 10.25" (1.48 m)    Weight  132 lb 6.4 oz (60.1 kg)    Waist Circumference  32.5 inches    Hip Circumference  38 inches    Waist to Hip Ratio  0.86 %    BMI (Calculated)  27.42        Nutrition Therapy Plan and Nutrition Goals: Nutrition Therapy & Goals - 06/25/17 1503      Personal Nutrition Goals   Comments  would like to lose weight and eat healthier      Intervention Plan   Intervention  Prescribe, educate and  counsel regarding individualized specific dietary modifications aiming towards targeted core components such as weight, hypertension, lipid management, diabetes, heart failure and other comorbidities.;Nutrition handout(s) given to patient.    Expected Outcomes  Short Term Goal: Understand basic principles of dietary content, such as calories, fat, sodium, cholesterol and nutrients.;Long Term Goal: Adherence to prescribed nutrition plan.       Nutrition Assessments: Nutrition Assessments - 06/25/17 1501  MEDFICTS Scores   Pre Score  107       Nutrition Goals Re-Evaluation:   Nutrition Goals Discharge (Final Nutrition Goals Re-Evaluation):   Psychosocial: Target Goals: Acknowledge presence or absence of significant depression and/or stress, maximize coping skills, provide positive support system. Participant is able to verbalize types and ability to use techniques and skills needed for reducing stress and depression.   Initial Review & Psychosocial Screening: Initial Psych Review & Screening - 06/25/17 1455      Initial Review   Current issues with  Current Depression;History of Depression;Current Psychotropic Meds;Current Sleep Concerns;Current Stress Concerns    Source of Stress Concerns  Poor Coping Skills;Family;Financial;Transportation;Occupation;Retirement/disability    Comments  she says she worries about everything.      Family Dynamics   Good Support System?  Yes    Comments  Her and Her husband support each other.      Barriers   Psychosocial barriers to participate in program  The patient should benefit from training in stress management and relaxation.      Screening Interventions   Interventions  To provide support and resources with identified psychosocial needs;Program counselor consult;Encouraged to exercise;Provide feedback about the scores to participant    Expected Outcomes  Short Term goal: Utilizing psychosocial counselor, staff and physician to assist  with identification of specific Stressors or current issues interfering with healing process. Setting desired goal for each stressor or current issue identified.;Long Term goal: The participant improves quality of Life and PHQ9 Scores as seen by post scores and/or verbalization of changes;Short Term goal: Identification and review with participant of any Quality of Life or Depression concerns found by scoring the questionnaire.;Long Term Goal: Stressors or current issues are controlled or eliminated.       Quality of Life Scores:  Scores of 19 and below usually indicate a poorer quality of life in these areas.  A difference of  2-3 points is a clinically meaningful difference.  A difference of 2-3 points in the total score of the Quality of Life Index has been associated with significant improvement in overall quality of life, self-image, physical symptoms, and general health in studies assessing change in quality of life.  PHQ-9: Recent Review Flowsheet Data    Depression screen [facility] 2/9 06/25/2017   Decreased Interest 2   Down, Depressed, Hopeless 2   PHQ - 2 Score 4   Altered sleeping 3   Tired, decreased energy 3   Change in appetite 3   Feeling bad or failure about yourself  2   Trouble concentrating 2   Moving slowly or fidgety/restless 2   Suicidal thoughts 0   PHQ-9 Score 19   Difficult doing work/chores Somewhat difficult     Interpretation of Total Score  Total Score Depression Severity:  1-4 = Minimal depression, 5-9 = Mild depression, 10-14 = Moderate depression, 15-19 = Moderately severe depression, 20-27 = Severe depression   Psychosocial Evaluation and Intervention:   Psychosocial Re-Evaluation:   Psychosocial Discharge (Final Psychosocial Re-Evaluation):   Education: Education Goals: Education classes will be provided on a weekly basis, covering required topics. Participant will state understanding/return demonstration of topics presented.  Learning  Barriers/Preferences: Learning Barriers/Preferences - 06/25/17 1508      Learning Barriers/Preferences   Learning Barriers  Sight reviewed with patient    Learning Preferences  None       Education Topics:  Initial Evaluation Education: - Verbal, written and demonstration of respiratory meds, oximetry and breathing techniques. Instruction on  use of nebulizers and MDIs and importance of monitoring MDI activations.   Pulmonary Rehab from 06/25/2017 in [facility] Cardiac and Pulmonary Rehab  Date  06/25/17  Educator  [facility]  Instruction Review Code  1- Verbalizes Understanding      General Nutrition Guidelines/Fats and Fiber: -Group instruction provided by verbal, written material, models and posters to present the general guidelines for heart healthy nutrition. Gives an explanation and review of dietary fats and fiber.   Controlling Sodium/Reading Food Labels: -Group verbal and written material supporting the discussion of sodium use in heart healthy nutrition. Review and explanation with models, verbal and written materials for utilization of the food label.   Exercise Physiology & General Exercise Guidelines: - Group verbal and written instruction with models to review the exercise physiology of the cardiovascular system and associated critical values. Provides general exercise guidelines with specific guidelines to those with heart or lung disease.    Aerobic Exercise & Resistance Training: - Gives group verbal and written instruction on the various components of exercise. Focuses on aerobic and resistive training programs and the benefits of this training and how to safely progress through these programs.   Flexibility, Balance, Mind/Body Relaxation: Provides group verbal/written instruction on the benefits of flexibility and balance training, including mind/body exercise modes such as yoga, pilates and tai chi.  Demonstration and skill practice provided.   Stress and Anxiety: -  Provides group verbal and written instruction about the health risks of elevated stress and causes of high stress.  Discuss the correlation between heart/lung disease and anxiety and treatment options. Review healthy ways to manage with stress and anxiety.   Depression: - Provides group verbal and written instruction on the correlation between heart/lung disease and depressed mood, treatment options, and the stigmas associated with seeking treatment.   Exercise & Equipment Safety: - Individual verbal instruction and demonstration of equipment use and safety with use of the equipment.   Pulmonary Rehab from 06/25/2017 in [facility] Cardiac and Pulmonary Rehab  Date  06/25/17  Educator  [facility]  Instruction Review Code  1- Verbalizes Understanding      Infection Prevention: - Provides verbal and written material to individual with discussion of infection control including proper hand washing and proper equipment cleaning during exercise session.   Pulmonary Rehab from 06/25/2017 in [facility] Cardiac and Pulmonary Rehab  Date  06/25/17  Educator  [facility]  Instruction Review Code  1- Verbalizes Understanding      Falls Prevention: - Provides verbal and written material to individual with discussion of falls prevention and safety.   Pulmonary Rehab from 06/25/2017 in [facility] Cardiac and Pulmonary Rehab  Date  06/25/17  Educator  [facility]  Instruction Review Code  1- Verbalizes Understanding      Diabetes: - Individual verbal and written instruction to review signs/symptoms of diabetes, desired ranges of glucose level fasting, after meals and with exercise. Advice that pre and post exercise glucose checks will be done for 3 sessions at entry of program.   Chronic Lung Diseases: - Group verbal and written instruction to review updates, respiratory medications, advancements in procedures and treatments. Discuss use of supplemental oxygen including available portable oxygen systems, continuous and intermittent flow rates,  concentrators, personal use and safety guidelines. Review proper use of inhaler and spacers. Provide informative websites for self-education.    Energy Conservation: - Provide group verbal and written instruction for methods to conserve energy, plan and organize activities. Instruct on pacing techniques, use of adaptive equipment and posture/positioning to  relieve shortness of breath.   Triggers and Exacerbations: - Group verbal and written instruction to review types of environmental triggers and ways to prevent exacerbations. Discuss weather changes, air quality and the benefits of nasal washing. Review warning signs and symptoms to help prevent infections. Discuss techniques for effective airway clearance, coughing, and vibrations.   AED/CPR: - Group verbal and written instruction with the use of models to demonstrate the basic use of the AED with the basic ABC's of resuscitation.   Anatomy and Physiology of the Lungs: - Group verbal and written instruction with the use of models to provide basic lung anatomy and physiology related to function, structure and complications of lung disease.   Anatomy & Physiology of the Heart: - Group verbal and written instruction and models provide basic cardiac anatomy and physiology, with the coronary electrical and arterial systems. Review of Valvular disease and Heart Failure   Cardiac Medications: - Group verbal and written instruction to review commonly prescribed medications for heart disease. Reviews the medication, class of the drug, and side effects.   Know Your Numbers and Risk Factors: -Group verbal and written instruction about important numbers in your health.  Discussion of what are risk factors and how they play a role in the disease process.  Review of Cholesterol, Blood Pressure, Diabetes, and BMI and the role they play in your overall health.   Sleep Hygiene: -Provides group verbal and written instruction about how sleep can  affect your health.  Define sleep hygiene, discuss sleep cycles and impact of sleep habits. Review good sleep hygiene tips.    Other: -Provides group and verbal instruction on various topics (see comments)    Knowledge Questionnaire Score: Knowledge Questionnaire Score - 06/25/17 1508      Knowledge Questionnaire Score   Pre Score  13/18 reviewed with patient        Core Components/Risk Factors/Patient Goals at Admission: Personal Goals and Risk Factors at Admission - 06/25/17 1547      Core Components/Risk Factors/Patient Goals on Admission    Weight Management  Yes;Weight Loss    Intervention  Weight Management: Develop a combined nutrition and exercise program designed to reach desired caloric intake, while maintaining appropriate intake of nutrient and fiber, sodium and fats, and appropriate energy expenditure required for the weight goal.;Weight Management: Provide education and appropriate resources to help participant work on and attain dietary goals.;Weight Management/Obesity: Establish reasonable short term and long term weight goals.    Admit Weight  132 lb 6.4 oz (60.1 kg)    Goal Weight: Short Term  127 lb (57.6 kg)    Goal Weight: Long Term  120 lb (54.4 kg)    Expected Outcomes  Long Term: Adherence to nutrition and physical activity/exercise program aimed toward attainment of established weight goal;Weight Loss: Understanding of general recommendations for a balanced deficit meal plan, which promotes 1-2 lb weight loss per week and includes a negative energy balance of [phone] kcal/d;Understanding recommendations for meals to include 15-35% energy as protein, 25-35% energy from fat, 35-60% energy from carbohydrates, less than 241m of dietary cholesterol, 20-35 gm of total fiber daily;Understanding of distribution of calorie intake throughout the day with the consumption of 4-5 meals/snacks;Weight Maintenance: Understanding of the daily nutrition guidelines, which includes  25-35% calories from fat, 7% or less cal from saturated fats, less than 2027mcholesterol, less than 1.5gm of sodium, & 5 or more servings of fruits and vegetables daily    Improve shortness of breath with ADL's  Yes    Intervention  Provide education, individualized exercise plan and daily activity instruction to help decrease symptoms of SOB with activities of daily living.    Expected Outcomes  Short Term: Improve cardiorespiratory fitness to achieve a reduction of symptoms when performing ADLs    Hypertension  Yes    Intervention  Provide education on lifestyle modifcations including regular physical activity/exercise, weight management, moderate sodium restriction and increased consumption of fresh fruit, vegetables, and low fat dairy, alcohol moderation, and smoking cessation.;Monitor prescription use compliance.    Expected Outcomes  Short Term: Continued assessment and intervention until BP is < 140/37m HG in hypertensive participants. < 130/864mHG in hypertensive participants with diabetes, heart failure or chronic kidney disease.;Long Term: Maintenance of blood pressure at goal levels.       Core Components/Risk Factors/Patient Goals Review:    Core Components/Risk Factors/Patient Goals at Discharge (Final Review):    ITP Comments: ITP Comments    Row Name 06/25/17 1416 07/02/17 0917         ITP Comments  Medical Evaluation completed. Chart sent for review and changes to Dr. MaEmily Filbertirector of LuBrian HeadDiagnosis can be found in [facility] encounter 06/13/17  30 day review completed. ITP sent to Dr. MaEmily Filbertirector of LuAshlandContinue with ITP unless changes are made by physician.         Comments: 30 day review

## 2017-07-02 NOTE — Progress Notes (Signed)
Daily Session Note  Patient Details  Name: Bailey Flores MRN: [id number] Date of Birth: [birth year] Referring Provider:     Pulmonary Rehab from 06/25/2017 in [facility]  Referring Provider  [name] MD      Encounter Date: 07/02/2017  Check In: Session Check In - 07/02/17 1055      Check-In   Location  [facility]-Cardiac & Pulmonary Rehab    Staff Present  Nada Maclachlan, BA, ACSM CEP, Exercise Physiologist;Kelly Amedeo Plenty, BS, ACSM CEP, Exercise Physiologist;Virgin Zellers Flavia Shipper    Supervising physician immediately available to respond to emergencies  [facility] immediately available ER MD    Physician(s)   Dr. Corky Downs and Rifenbark    Medication changes reported      No    Fall or balance concerns reported     No    Warm-up and Cool-down  Performed as group-led instruction    Resistance Training Performed  Yes    VAD Patient?  No      Pain Assessment   Currently in Pain?  No/denies          Social History   Tobacco Use  Smoking Status Former Smoker  . Packs/day: 2.00  . Years: 37.00  . Pack years: 74.00  . Types: Cigarettes  . Last attempt to quit: 07/19/2012  . Years since quitting: 4.9  Smokeless Tobacco Never Used    Goals Met:  Exercise tolerated well Queuing for purse lip breathing No report of cardiac concerns or symptoms Strength training completed today  Goals Unmet:  Not Applicable  Comments: First full day of exercise!  Patient was oriented to gym and equipment including functions, settings, policies, and procedures.  Patient's individual exercise prescription and treatment plan were reviewed.  All starting workloads were established based on the results of the 6 minute walk test done at initial orientation visit.  The plan for exercise progression was also introduced and progression will be customized based on patient's performance and goals.   Dr. Emily Filbert is Medical Director for [place] [facility] and  [facility].

## 2017-07-03 ENCOUNTER — Ambulatory Visit (INDEPENDENT_AMBULATORY_CARE_PROVIDER_SITE_OTHER): Admitting: Licensed Clinical Social Worker

## 2017-07-03 ENCOUNTER — Encounter: Payer: Self-pay | Admitting: Internal Medicine

## 2017-07-03 DIAGNOSIS — G4733 Obstructive sleep apnea (adult) (pediatric): Secondary | ICD-10-CM

## 2017-07-03 DIAGNOSIS — J449 Chronic obstructive pulmonary disease, unspecified: Secondary | ICD-10-CM

## 2017-07-03 DIAGNOSIS — F3130 Bipolar disorder, current episode depressed, mild or moderate severity, unspecified: Secondary | ICD-10-CM

## 2017-07-04 DIAGNOSIS — J449 Chronic obstructive pulmonary disease, unspecified: Secondary | ICD-10-CM | POA: Diagnosis not present

## 2017-07-04 NOTE — Progress Notes (Signed)
Daily Session Note  Patient Details  Name: Bailey Flores MRN: [id number] Date of Birth: 03/14/1961 Referring Provider:     Pulmonary Rehab from 06/25/2017 in [facility]  Referring Provider  Ramachandran, Pradeep MD      Encounter Date: 07/04/2017  Check In: Session Check In - 07/04/17 1011      Check-In   Location  [facility]    Staff Present  Amanda Sommer, BA, ACSM CEP, Exercise Physiologist;Jessica Hawkins, MA, RCEP, CCRP, Exercise Physiologist;  RCP,RRT,BSRT    Supervising physician immediately available to respond to emergencies  [facility] immediately available ER MD    Physician(s)  Dr. McShane and Veronese    Medication changes reported      No    Fall or balance concerns reported     No    Warm-up and Cool-down  Performed as group-led instruction    Resistance Training Performed  Yes    VAD Patient?  No      Pain Assessment   Currently in Pain?  No/denies          Social History   Tobacco Use  Smoking Status Former Smoker  . Packs/day: 2.00  . Years: 37.00  . Pack years: 74.00  . Types: Cigarettes  . Last attempt to quit: 07/19/2012  . Years since quitting: 4.9  Smokeless Tobacco Never Used    Goals Met:  Independence with exercise equipment Exercise tolerated well No report of cardiac concerns or symptoms Strength training completed today  Goals Unmet:  Not Applicable  Comments: Pt able to follow exercise prescription today without complaint.  Will continue to monitor for progression.   Dr. Mark Miller is Medical Director for [facility] and [facility]. 

## 2017-07-06 DIAGNOSIS — G4733 Obstructive sleep apnea (adult) (pediatric): Secondary | ICD-10-CM | POA: Diagnosis not present

## 2017-07-10 ENCOUNTER — Telehealth: Payer: Self-pay | Admitting: *Deleted

## 2017-07-10 NOTE — Telephone Encounter (Signed)
Pt has f/u scheduled to discuss sleep study results.ss

## 2017-07-10 NOTE — Progress Notes (Signed)
[facility] Consultation      Assessment and Plan:  The patient is a 57 year old female with history of COPD, sleep apnea and chronic dyspnea on exertion.  COPD.  -Patient has emphysematous changes seen on CT chest, already appears to be on maximal therapy with Advair and Spiriva.  At last visit she was weaned off of Bronkaid, she appears to be doing well, will decrease Advair to once daily, and consider weaning further at next visit. - Patient was easily referred to pulmonary rehab, and sent for a pulmonary function test. -Prevnar 13 administered. 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  Sleep apnea.  -Home sleep study 07/03/17 showed an AHI of 20. -She does not want to use CPAP at this time, will refer to dentist.   Dyspnea on exertion.  -Patient has significant deconditioning in addition to her COPD.  She has already quit smoking. -Has been referred to pulmonary rehab.    Date: 07/10/2017  MRN# [id number] Bailey Flores 04-24-1961    Bailey Flores is a 57 y.o. old female seen in consultation for chief complaint of:    Chief Complaint  Patient presents with  . Sleep Apnea    pt here to discuss result of HST.    Synopsis: The patient is a 57 year old female last seen with symptoms of dyspnea, findings of emphysema on imaging secondary to a history of smoking. 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  She is present with her husband today who gives some of the history.  She was also noted to have sleep apnea which was not being treated.  She was asked to continue Advair, Spiriva, discontinue over-the-counter Bronkaid, and sent for sleep study. She has ADHD and takes Vyvanse.   Subjective She is using advair, and spiriva respimat 2 puffs at night, they seem to be helping with her breathing. She has stopped taking Brokaid which made her slightly breathing worse.    **Prevnar 13; given 05/04/17 **Desat walk 05/04/17 on RA at rest; 96% and HR 90. Walked 180 feet, conversational no dyspnea. Sat was 91% and HR 111.  **Imaging personally reviewed; low dose CT chest 05/01/17; no nodules, mild emphysema per report but otherwise lungs appear normal.   Medication:    Current Outpatient Medications:  .  albuterol (PROVENTIL HFA;VENTOLIN HFA) 108 (90 Base) MCG/ACT inhaler, Inhale into the lungs., Disp: , Rfl:  .  buPROPion (WELLBUTRIN XL) 300 MG 24 hr tablet, Take 1 tablet (300 mg total) by mouth every morning., Disp: 30 tablet, Rfl: 1 .  ePHEDrine-GuaiFENesin 25-400 MG TABS, Take by mouth., Disp: , Rfl:  .  Fluticasone-Salmeterol (ADVAIR DISKUS) 250-50 MCG/DOSE AEPB, Inhale into the lungs., Disp: , Rfl:  .  gabapentin (NEURONTIN) 300 MG capsule, Take 300 mg 3 (three) times daily by mouth. , Disp: , Rfl:  .  ibuprofen (ADVIL,MOTRIN) 800 MG tablet, Take by mouth., Disp: , Rfl:  .  ipratropium-albuterol (DUONEB) 0.5-2.5 (3) MG/3ML SOLN, Inhale into the lungs., Disp: , Rfl:  .  lamoTRIgine (LAMICTAL) 100 MG tablet, Take by mouth., Disp: , Rfl:  .  lisdexamfetamine (VYVANSE) 70 MG capsule, Take by mouth., Disp: , Rfl:  .  losartan-hydrochlorothiazide (HYZAAR) 50-12.5 MG tablet, Take by mouth., Disp: , Rfl:  .  lubiprostone (AMITIZA) 24 MCG capsule, Take by mouth., Disp: , Rfl:  .  lurasidone (LATUDA) 40 MG TABS tablet, Take 1 tablet (40 mg total) by  mouth daily with supper., Disp: 30 tablet, Rfl: 1 .  MAGNESIUM PO, Take by mouth daily., Disp: , Rfl:  .  Melatonin 10 MG TABS, Take by mouth., Disp: , Rfl:  .  montelukast (SINGULAIR) 10 MG tablet, Take by mouth., Disp: , Rfl:  .  Multiple Vitamins-Minerals (CENTRUM SILVER PO), Take by mouth daily., Disp: , Rfl:  .  omeprazole (PRILOSEC) 40 MG capsule, Take by mouth., Disp: , Rfl:  .  Tiotropium Bromide Monohydrate (SPIRIVA RESPIMAT) 1.25 MCG/ACT AERS, Inhale into the lungs., Disp: , Rfl:  .  valACYclovir (VALTREX)  1000 MG tablet, Take by mouth., Disp: , Rfl:    Allergies:  Hydrocodone-acetaminophen  Review of Systems: Gen:  Denies  fever, sweats, chills HEENT: Denies blurred vision, double vision. bleeds, sore throat Cvc:  No dizziness, chest pain. Resp:   Denies cough or sputum production, shortness of breath Gi: Denies swallowing difficulty, stomach pain. Gu:  Denies bladder incontinence, burning urine Ext:   No Joint pain, stiffness. Skin: No skin rash,  hives  Endoc:  No polyuria, polydipsia. Psych: No depression, insomnia. Other:  All other systems were reviewed with the patient and were negative other that what is mentioned in the HPI.   Physical Examination:   VS: BP 124/88 (BP Location: Left Arm, Cuff Size: Normal)   Pulse 99   Resp 16   Ht 4' 10.4" (1.483 m)   Wt 128 lb (58.1 kg)   SpO2 93%   BMI 26.39 kg/m   General Appearance: No distress  Neuro:without focal findings,  speech normal,  HEENT: PERRLA, EOM intact.  Mallampati 3 Pulmonary: normal breath sounds, No wheezing.  CardiovascularNormal S1,S2.  No m/r/g.   Abdomen: Benign, Soft, non-tender. Renal:  No costovertebral tenderness  GU:  No performed at this time. Endoc: No evident thyromegaly, no signs of acromegaly. Skin:   warm, no rashes, no ecchymosis  Extremities: normal, no cyanosis, clubbing.  Other findings:    LABORATORY PANEL:   CBC No results for input(s): WBC, HGB, HCT, PLT in the last 168 hours. ------------------------------------------------------------------------------------------------------------------  Chemistries  No results for input(s): NA, K, CL, CO2, GLUCOSE, BUN, CREATININE, CALCIUM, MG, AST, ALT, ALKPHOS, BILITOT in the last 168 hours.  Invalid input(s): GFRCGP ------------------------------------------------------------------------------------------------------------------  Cardiac Enzymes No results for input(s): TROPONINI in the last 168  hours. ------------------------------------------------------------  RADIOLOGY:  No results found.     Thank  you for the consultation and for allowing [facility] to assist in the care of your patient. Our recommendations are noted above.  Please contact us if we can be of further service.   Marda Stalker, MD.  Board Certified in Internal Medicine, Pulmonary Medicine, [place], and Sleep Medicine.  [facility] Office Number: [phone]  Patricia Pesa, M.D.  Merton Border, M.D  07/10/2017

## 2017-07-11 ENCOUNTER — Encounter: Payer: Self-pay | Admitting: Internal Medicine

## 2017-07-11 ENCOUNTER — Ambulatory Visit (INDEPENDENT_AMBULATORY_CARE_PROVIDER_SITE_OTHER): Payer: TRICARE For Life (TFL) | Admitting: Internal Medicine

## 2017-07-11 ENCOUNTER — Telehealth: Payer: Self-pay

## 2017-07-11 VITALS — BP 124/88 | HR 99 | Resp 16 | Ht 58.4 in | Wt 128.0 lb

## 2017-07-11 DIAGNOSIS — J449 Chronic obstructive pulmonary disease, unspecified: Secondary | ICD-10-CM

## 2017-07-11 DIAGNOSIS — G4733 Obstructive sleep apnea (adult) (pediatric): Secondary | ICD-10-CM

## 2017-07-11 NOTE — Progress Notes (Signed)
Pulmonary Individual Treatment Plan  Patient Details   Name: Bailey Flores MRN: [id number] Date of Birth: [birth year] Referring Provider:     Pulmonary Rehab from 06/25/2017 in [facility] Cardiac and Pulmonary Rehab  Referring Provider  [name] MD      Initial Encounter Date:    Pulmonary Rehab from 06/25/2017 in [facility] Cardiac and Pulmonary Rehab  Date  06/25/17  Referring Provider  [name] MD      Visit Diagnosis: Chronic obstructive pulmonary disease, unspecified COPD type ([place])  Patient's Home Medications on Admission:  Current Outpatient Medications:  .  albuterol (PROVENTIL HFA;VENTOLIN HFA) 108 (90 Base) MCG/ACT inhaler, Inhale into the lungs., Disp: , Rfl:  .  buPROPion (WELLBUTRIN XL) 300 MG 24 hr tablet, Take 1 tablet (300 mg total) by mouth every morning., Disp: 30 tablet, Rfl: 1 .  ePHEDrine-GuaiFENesin 25-400 MG TABS, Take by mouth., Disp: , Rfl:  .  Fluticasone-Salmeterol (ADVAIR DISKUS) 250-50 MCG/DOSE AEPB, Inhale into the lungs., Disp: , Rfl:  .  gabapentin (NEURONTIN) 300 MG capsule, Take 300 mg 3 (three) times daily by mouth. , Disp: , Rfl:  .  ibuprofen (ADVIL,MOTRIN) 800 MG tablet, Take by mouth., Disp: , Rfl:  .  ipratropium-albuterol (DUONEB) 0.5-2.5 (3) MG/3ML SOLN, Inhale into the lungs., Disp: , Rfl:  .  lamoTRIgine (LAMICTAL) 100 MG tablet, Take by mouth., Disp: , Rfl:  .  lisdexamfetamine (VYVANSE) 70 MG capsule, Take by mouth., Disp: , Rfl:  .  losartan-hydrochlorothiazide (HYZAAR) 50-12.5 MG tablet, Take by mouth., Disp: , Rfl:  .  lubiprostone (AMITIZA) 24 MCG capsule, Take by mouth., Disp: , Rfl:  .  lurasidone (LATUDA) 40 MG TABS tablet, Take 1 tablet (40 mg total) by mouth daily with supper., Disp: 30 tablet, Rfl: 1 .  MAGNESIUM PO, Take by mouth daily., Disp: , Rfl:  .  Melatonin 10 MG TABS, Take by mouth., Disp: , Rfl:  .  montelukast (SINGULAIR) 10 MG tablet, Take by mouth., Disp: , Rfl:  .  Multiple Vitamins-Minerals (CENTRUM  SILVER PO), Take by mouth daily., Disp: , Rfl:  .  omeprazole (PRILOSEC) 40 MG capsule, Take by mouth., Disp: , Rfl:  .  Tiotropium Bromide Monohydrate (SPIRIVA RESPIMAT) 1.25 MCG/ACT AERS, Inhale into the lungs., Disp: , Rfl:  .  valACYclovir (VALTREX) 1000 MG tablet, Take by mouth., Disp: , Rfl:   Past Medical History: Past Medical History:  Diagnosis Date  . Arthritis    hands  . Asthma   . Barrett's esophagus   . Cancer [facility])    neuroendocrine ca  . COPD (chronic obstructive pulmonary disease) ([place])   . Hypertension   . Motion sickness    reading in car    Tobacco Use: Social History   Tobacco Use  Smoking Status Former Smoker  . Packs/day: 2.00  . Years: 37.00  . Pack years: 74.00  . Types: Cigarettes  . Last attempt to quit: 07/19/2012  . Years since quitting: 4.9  Smokeless Tobacco Never Used    Labs: Recent Review Scientist, physiological    Labs for ITP Cardiac and Pulmonary Rehab Latest Ref Rng & Units 06/13/2017   Cholestrol 0 - 200 mg/dL 183   LDLCALC 0 - 99 mg/dL 75   HDL >40 mg/dL 92   Trlycerides <150 mg/dL 80   Hemoglobin A1c 4.8 - 5.6 % 5.9(H)       Pulmonary Assessment Scores: Pulmonary Assessment Scores    Row Name 06/25/17 [phone]  ADL [facility]SD   ADL Phase  Entry     SOB Score total  43     Rest  0     Walk  2     Stairs  4     Bath  2     Dress  0     Shop  2       CAT Score   CAT Score  19       mMRC Score   mMRC Score  3        Pulmonary Function Assessment: Pulmonary Function Assessment - 06/25/17 1628      Initial Spirometry Results   FVC%  64 %    FEV1%  66 %    FEV1/FVC Ratio  79.85    Comments  good patient effort      Post Bronchodilator Spirometry Results   FVC%  62.08 %    FEV1%  64.68 %    FEV1/FVC Ratio  81.15    Comments  good patient effort      Breath   Bilateral Breath Sounds  Clear;Decreased    Shortness of Breath  Yes;Limiting activity       Exercise Target Goals:    Exercise Program  Goal: Individual exercise prescription set using results from initial 6 min walk test and THRR while considering  patient's activity barriers and safety.    Exercise Prescription Goal: Initial exercise prescription builds to 30-45 minutes a day of aerobic activity, 2-3 days per week.  Home exercise guidelines will be given to patient during program as part of exercise prescription that the participant will acknowledge.  Activity Barriers & Risk Stratification: Activity Barriers & Cardiac Risk Stratification - 06/25/17 1652      Activity Barriers & Cardiac Risk Stratification   Activity Barriers  Arthritis;Deconditioning;Muscular Weakness;Shortness of Breath;History of Falls;Balance Concerns       6 Minute Walk: 6 Minute Walk    Row Name 06/25/17 1650         6 Minute Walk   Distance  1406 feet     Walk Time  6 minutes     # of Rest Breaks  0     MPH  2.66     METS  3.98     RPE  10     Perceived Dyspnea   1     VO2 Peak  13.94     Symptoms  Yes (comment)     Comments  leg pain (possibly sciatt[place]) 5/10     Resting HR  87 bpm     Resting BP  124/74     Resting Oxygen Saturation   97 %     Exercise Oxygen Saturation  during 6 min walk  91 %     Max Ex. HR  118 bpm     Max Ex. BP  162/84     2 Minute Post BP  146/74       Interval HR   1 Minute HR  100     2 Minute HR  107     3 Minute HR  111     4 Minute HR  112     5 Minute HR  114     6 Minute HR  118     2 Minute Post HR  89     Interval Heart Rate?  Yes       Interval Oxygen   Interval Oxygen?  Yes     Baseline Oxygen  Saturation %  97 %     1 Minute Oxygen Saturation %  93 %     1 Minute Liters of Oxygen  0 L Room Air     2 Minute Oxygen Saturation %  91 %     2 Minute Liters of Oxygen  0 L     3 Minute Oxygen Saturation %  94 %     3 Minute Liters of Oxygen  0 L     4 Minute Oxygen Saturation %  93 %     4 Minute Liters of Oxygen  0 L     5 Minute Oxygen Saturation %  94 %     5 Minute Liters of  Oxygen  0 L     6 Minute Oxygen Saturation %  93 %     6 Minute Liters of Oxygen  0 L     2 Minute Post Oxygen Saturation %  97 %     2 Minute Post Liters of Oxygen  0 L       Oxygen Initial Assessment: Oxygen Initial Assessment - 06/25/17 1547      Home Oxygen   Home Oxygen Device  None    Sleep Oxygen Prescription  None    Home Exercise Oxygen Prescription  None    Home at Rest Exercise Oxygen Prescription  None      Initial 6 min Walk   Oxygen Used  None      Program Oxygen Prescription   Program Oxygen Prescription  None      Intervention   Short Term Goals  To learn and understand importance of maintaining oxygen saturations>88%;To learn and demonstrate proper use of respiratory medications;To learn and demonstrate proper pursed lip breathing techniques or other breathing techniques.;To learn and understand importance of monitoring SPO2 with pulse oximeter and demonstrate accurate use of the pulse oximeter.    Long  Term Goals  Verbalizes importance of monitoring SPO2 with pulse oximeter and return demonstration;Maintenance of O2 saturations>88%;Exhibits proper breathing techniques, such as pursed lip breathing or other method taught during program session;Compliance with respiratory medication;Demonstrates proper use of MDI's       Oxygen Re-Evaluation: Oxygen Re-Evaluation    Row Name 07/02/17 1058             Program Oxygen Prescription   Program Oxygen Prescription  None         Home Oxygen   Home Oxygen Device  None       Sleep Oxygen Prescription  None       Home Exercise Oxygen Prescription  None       Home at Rest Exercise Oxygen Prescription  None         Goals/Expected Outcomes   Short Term Goals  To learn and understand importance of maintaining oxygen saturations>88%;To learn and demonstrate proper use of respiratory medications;To learn and demonstrate proper pursed lip breathing techniques or other breathing techniques.;To learn and understand  importance of monitoring SPO2 with pulse oximeter and demonstrate accurate use of the pulse oximeter.       Long  Term Goals  Verbalizes importance of monitoring SPO2 with pulse oximeter and return demonstration;Maintenance of O2 saturations>88%;Exhibits proper breathing techniques, such as pursed lip breathing or other method taught during program session;Compliance with respiratory medication;Demonstrates proper use of MDI's       Comments  Reviewed PLB technique with pt.  Talked about how it work and it's important to maintaining his exercise saturations.  Goals/Expected Outcomes  Short: Become more profiecient at using PLB.   Long: Become independent at using PLB.          Oxygen Discharge (Final Oxygen Re-Evaluation): Oxygen Re-Evaluation - 07/02/17 1058      Program Oxygen Prescription   Program Oxygen Prescription  None      Home Oxygen   Home Oxygen Device  None    Sleep Oxygen Prescription  None    Home Exercise Oxygen Prescription  None    Home at Rest Exercise Oxygen Prescription  None      Goals/Expected Outcomes   Short Term Goals  To learn and understand importance of maintaining oxygen saturations>88%;To learn and demonstrate proper use of respiratory medications;To learn and demonstrate proper pursed lip breathing techniques or other breathing techniques.;To learn and understand importance of monitoring SPO2 with pulse oximeter and demonstrate accurate use of the pulse oximeter.    Long  Term Goals  Verbalizes importance of monitoring SPO2 with pulse oximeter and return demonstration;Maintenance of O2 saturations>88%;Exhibits proper breathing techniques, such as pursed lip breathing or other method taught during program session;Compliance with respiratory medication;Demonstrates proper use of MDI's    Comments  Reviewed PLB technique with pt.  Talked about how it work and it's important to maintaining his exercise saturations.      Goals/Expected Outcomes  Short: Become  more profiecient at using PLB.   Long: Become independent at using PLB.       Initial Exercise Prescription: Initial Exercise Prescription - 06/25/17 1600      Date of Initial Exercise RX and Referring Provider   Date  06/25/17    Referring Provider  [name] MD      Treadmill   MPH  2.6    Grade  0.5    Minutes  15    METs  3.17      NuStep   Level  3    SPM  80    Minutes  15    METs  2.5      Recumbant Elliptical   Level  1    RPM  50    Minutes  15    METs  2.5      Prescription Details   Frequency (times per week)  3    Duration  Progress to 45 minutes of aerobic exercise without signs/symptoms of physical distress      Intensity   THRR 40-80% of Max Heartrate  117-146    Ratings of Perceived Exertion  11-13    Perceived Dyspnea  0-4      Progression   Progression  Continue to progress workloads to maintain intensity without signs/symptoms of physical distress.      Resistance Training   Training Prescription  Yes    Weight  3 lbs    Reps  10-15       Perform Capillary Blood Glucose checks as needed.  Exercise Prescription Changes:  Exercise Prescription Changes    Row Name 06/25/17 1600 07/04/17 1400           Response to Exercise   Blood Pressure (Admit)  124/74  124/72      Blood Pressure (Exercise)  162/84  144/78      Blood Pressure (Exit)  146/74  130/64      Heart Rate (Admit)  87 bpm  82 bpm      Heart Rate (Exercise)  118 bpm  142 bpm      Heart Rate (Exit)  89 bpm  102 bpm      Oxygen Saturation (Admit)  97 %  97 %      Oxygen Saturation (Exercise)  91 %  93 %      Oxygen Saturation (Exit)  97 %  96 %      Rating of Perceived Exertion (Exercise)  10  16      Perceived Dyspnea (Exercise)  1  2      Symptoms  leg pain  -      Comments  walk test results  -      Duration  -  Continue with 45 min of aerobic exercise without signs/symptoms of physical distress.      Intensity  -  THRR unchanged        Progression    Progression  -  Continue to progress workloads to maintain intensity without signs/symptoms of physical distress.      Average METs  -  2.64        Resistance Training   Training Prescription  -  Yes      Weight  -  3 lbs      Reps  -  10-15        Interval Training   Interval Training  -  No        Treadmill   MPH  -  2.6      Grade  -  0.5      Minutes  -  15      METs  -  3.17        Recumbant Elliptical   Level  -  1      RPM  -  50      Minutes  -  15      METs  -  2.1         Exercise Comments:  Exercise Comments    Row Name 07/02/17 1056           Exercise Comments  First full day of exercise!  Patient was oriented to gym and equipment including functions, settings, policies, and procedures.  Patient's individual exercise prescription and treatment plan were reviewed.  All starting workloads were established based on the results of the 6 minute walk test done at initial orientation visit.  The plan for exercise progression was also introduced and progression will be customized based on patient's performance and goals.          Exercise Goals and Review:  Exercise Goals    Row Name 06/25/17 1655             Exercise Goals   Increase Physical Activity  Yes       Intervention  Provide advice, education, support and counseling about physical activity/exercise needs.;Develop an individualized exercise prescription for aerobic and resistive training based on initial evaluation findings, risk stratification, comorbidities and participant's personal goals.       Expected Outcomes  Short Term: Attend rehab on a regular basis to increase amount of physical activity.;Long Term: Add in home exercise to make exercise part of routine and to increase amount of physical activity.;Long Term: Exercising regularly at least 3-5 days a week.       Increase Strength and Stamina  Yes       Intervention  Provide advice, education, support and counseling about physical  activity/exercise needs.;Develop an individualized exercise prescription for aerobic and resistive training based on initial evaluation findings, risk stratification, comorbidities and participant's personal goals.  Expected Outcomes  Short Term: Increase workloads from initial exercise prescription for resistance, speed, and METs.;Short Term: Perform resistance training exercises routinely during rehab and add in resistance training at home;Long Term: Improve cardiorespiratory fitness, muscular endurance and strength as measured by increased METs and functional capacity (6MWT)       Able to understand and use rate of perceived exertion (RPE) scale  Yes       Intervention  Provide education and explanation on how to use RPE scale       Expected Outcomes  Short Term: Able to use RPE daily in rehab to express subjective intensity level;Long Term:  Able to use RPE to guide intensity level when exercising independently       Able to understand and use Dyspnea scale  Yes       Intervention  Provide education and explanation on how to use Dyspnea scale       Expected Outcomes  Short Term: Able to use Dyspnea scale daily in rehab to express subjective sense of shortness of breath during exertion;Long Term: Able to use Dyspnea scale to guide intensity level when exercising independently       Knowledge and understanding of Target Heart Rate Range (THRR)  Yes       Intervention  Provide education and explanation of THRR including how the numbers were predicted and where they are located for reference       Expected Outcomes  Short Term: Able to state/look up THRR;Long Term: Able to use THRR to govern intensity when exercising independently;Short Term: Able to use daily as guideline for intensity in rehab       Able to check pulse independently  Yes       Intervention  Provide education and demonstration on how to check pulse in carotid and radial arteries.;Review the importance of being able to check your  own pulse for safety during independent exercise       Expected Outcomes  Short Term: Able to explain why pulse checking is important during independent exercise;Long Term: Able to check pulse independently and accurately       Understanding of Exercise Prescription  Yes       Intervention  Provide education, explanation, and written materials on patient's individual exercise prescription       Expected Outcomes  Long Term: Able to explain home exercise prescription to exercise independently;Short Term: Able to explain program exercise prescription          Exercise Goals Re-Evaluation : Exercise Goals Re-Evaluation    [place] Name 07/02/17 1056             Exercise Goal Re-Evaluation   Exercise Goals Review  Understanding of Exercise Prescription;Able to understand and use Dyspnea scale;Knowledge and understanding of Target Heart Rate Range (THRR);Able to understand and use rate of perceived exertion (RPE) scale       Comments  Reviewed RPE scale, THR and program prescription with pt today.  Pt voiced understanding and was given a copy of goals to take home.        Expected Outcomes  Short: Use RPE daily to regulate intensity.  Long: Follow program prescription in THR.          Discharge Exercise Prescription (Final Exercise Prescription Changes): Exercise Prescription Changes - 07/04/17 1400      Response to Exercise   Blood Pressure (Admit)  124/72    Blood Pressure (Exercise)  144/78    Blood Pressure (Exit)  130/64  Heart Rate (Admit)  82 bpm    Heart Rate (Exercise)  142 bpm    Heart Rate (Exit)  102 bpm    Oxygen Saturation (Admit)  97 %    Oxygen Saturation (Exercise)  93 %    Oxygen Saturation (Exit)  96 %    Rating of Perceived Exertion (Exercise)  16    Perceived Dyspnea (Exercise)  2    Duration  Continue with 45 min of aerobic exercise without signs/symptoms of physical distress.    Intensity  THRR unchanged      Progression   Progression  Continue to progress  workloads to maintain intensity without signs/symptoms of physical distress.    Average METs  2.64      Resistance Training   Training Prescription  Yes    Weight  3 lbs    Reps  10-15      Interval Training   Interval Training  No      Treadmill   MPH  2.6    Grade  0.5    Minutes  15    METs  3.17      Recumbant Elliptical   Level  1    RPM  50    Minutes  15    METs  2.1       Nutrition:  Target Goals: Understanding of nutrition guidelines, daily intake of sodium <1580m, cholesterol <2033m calories 30% from fat and 7% or less from saturated fats, daily to have 5 or more servings of fruits and vegetables.  Biometrics: Pre Biometrics - 06/25/17 1655      Pre Biometrics   Height  4' 10.25" (1.48 m)    Weight  132 lb 6.4 oz (60.1 kg)    Waist Circumference  32.5 inches    Hip Circumference  38 inches    Waist to Hip Ratio  0.86 %    BMI (Calculated)  27.42        Nutrition Therapy Plan and Nutrition Goals: Nutrition Therapy & Goals - 06/25/17 1503      Personal Nutrition Goals   Comments  would like to lose weight and eat healthier      Intervention Plan   Intervention  Prescribe, educate and counsel regarding individualized specific dietary modifications aiming towards targeted core components such as weight, hypertension, lipid management, diabetes, heart failure and other comorbidities.;Nutrition handout(s) given to patient.    Expected Outcomes  Short Term Goal: Understand basic principles of dietary content, such as calories, fat, sodium, cholesterol and nutrients.;Long Term Goal: Adherence to prescribed nutrition plan.       Nutrition Assessments: Nutrition Assessments - 06/25/17 1501      MEDFICTS Scores   Pre Score  107       Nutrition Goals Re-Evaluation:   Nutrition Goals Discharge (Final Nutrition Goals Re-Evaluation):   Psychosocial: Target Goals: Acknowledge presence or absence of significant depression and/or stress, maximize coping  skills, provide positive support system. Participant is able to verbalize types and ability to use techniques and skills needed for reducing stress and depression.   Initial Review & Psychosocial Screening: Initial Psych Review & Screening - 06/25/17 1455      Initial Review   Current issues with  Current Depression;History of Depression;Current Psychotropic Meds;Current Sleep Concerns;Current Stress Concerns    Source of Stress Concerns  Poor Coping Skills;Family;Financial;Transportation;Occupation;Retirement/disability    Comments  she says she worries about everything.      Family Dynamics   Good Support System?  Yes  Comments  Her and Her husband support each other.      Barriers   Psychosocial barriers to participate in program  The patient should benefit from training in stress management and relaxation.      Screening Interventions   Interventions  To provide support and resources with identified psychosocial needs;Program counselor consult;Encouraged to exercise;Provide feedback about the scores to participant    Expected Outcomes  Short Term goal: Utilizing psychosocial counselor, staff and physician to assist with identification of specific Stressors or current issues interfering with healing process. Setting desired goal for each stressor or current issue identified.;Long Term goal: The participant improves quality of Life and PHQ9 Scores as seen by post scores and/or verbalization of changes;Short Term goal: Identification and review with participant of any Quality of Life or Depression concerns found by scoring the questionnaire.;Long Term Goal: Stressors or current issues are controlled or eliminated.       Quality of Life Scores:  Scores of 19 and below usually indicate a poorer quality of life in these areas.  A difference of  2-3 points is a clinically meaningful difference.  A difference of 2-3 points in the total score of the Quality of Life Index has been associated  with significant improvement in overall quality of life, self-image, physical symptoms, and general health in studies assessing change in quality of life.  PHQ-9: Recent Review Flowsheet Data    Depression screen [facility] 2/9 06/25/2017   Decreased Interest 2   Down, Depressed, Hopeless 2   PHQ - 2 Score 4   Altered sleeping 3   Tired, decreased energy 3   Change in appetite 3   Feeling bad or failure about yourself  2   Trouble concentrating 2   Moving slowly or fidgety/restless 2   Suicidal thoughts 0   PHQ-9 Score 19   Difficult doing work/chores Somewhat difficult     Interpretation of Total Score  Total Score Depression Severity:  1-4 = Minimal depression, 5-9 = Mild depression, 10-14 = Moderate depression, 15-19 = Moderately severe depression, 20-27 = Severe depression   Psychosocial Evaluation and Intervention:   Psychosocial Re-Evaluation:   Psychosocial Discharge (Final Psychosocial Re-Evaluation):   Education: Education Goals: Education classes will be provided on a weekly basis, covering required topics. Participant will state understanding/return demonstration of topics presented.  Learning Barriers/Preferences: Learning Barriers/Preferences - 06/25/17 1508      Learning Barriers/Preferences   Learning Barriers  Sight reviewed with patient    Learning Preferences  None       Education Topics:  Initial Evaluation Education: - Verbal, written and demonstration of respiratory meds, oximetry and breathing techniques. Instruction on use of nebulizers and MDIs and importance of monitoring MDI activations.   Pulmonary Rehab from 07/04/2017 in [facility] Cardiac and Pulmonary Rehab  Date  06/25/17  Educator  [facility]  Instruction Review Code  1- Verbalizes Understanding      General Nutrition Guidelines/Fats and Fiber: -Group instruction provided by verbal, written material, models and posters to present the general guidelines for heart healthy nutrition. Gives an explanation  and review of dietary fats and fiber.   Controlling Sodium/Reading Food Labels: -Group verbal and written material supporting the discussion of sodium use in heart healthy nutrition. Review and explanation with models, verbal and written materials for utilization of the food label.   Exercise Physiology & General Exercise Guidelines: - Group verbal and written instruction with models to review the exercise physiology of the cardiovascular system and associated critical values.  Provides general exercise guidelines with specific guidelines to those with heart or lung disease.    Aerobic Exercise & Resistance Training: - Gives group verbal and written instruction on the various components of exercise. Focuses on aerobic and resistive training programs and the benefits of this training and how to safely progress through these programs.   Flexibility, Balance, Mind/Body Relaxation: Provides group verbal/written instruction on the benefits of flexibility and balance training, including mind/body exercise modes such as yoga, pilates and tai chi.  Demonstration and skill practice provided.   Pulmonary Rehab from 07/04/2017 in [facility] Cardiac and Pulmonary Rehab  Date  07/04/17  Educator  AS  Instruction Review Code  1- Verbalizes Understanding      Stress and Anxiety: - Provides group verbal and written instruction about the health risks of elevated stress and causes of high stress.  Discuss the correlation between heart/lung disease and anxiety and treatment options. Review healthy ways to manage with stress and anxiety.   Depression: - Provides group verbal and written instruction on the correlation between heart/lung disease and depressed mood, treatment options, and the stigmas associated with seeking treatment.   Exercise & Equipment Safety: - Individual verbal instruction and demonstration of equipment use and safety with use of the equipment.   Pulmonary Rehab from 07/04/2017 in [facility]  Cardiac and Pulmonary Rehab  Date  06/25/17  Educator  [facility]  Instruction Review Code  1- Verbalizes Understanding      Infection Prevention: - Provides verbal and written material to individual with discussion of infection control including proper hand washing and proper equipment cleaning during exercise session.   Pulmonary Rehab from 07/04/2017 in [facility] Cardiac and Pulmonary Rehab  Date  06/25/17  Educator  [facility]  Instruction Review Code  1- Verbalizes Understanding      Falls Prevention: - Provides verbal and written material to individual with discussion of falls prevention and safety.   Pulmonary Rehab from 07/04/2017 in [facility] Cardiac and Pulmonary Rehab  Date  06/25/17  Educator  [facility]  Instruction Review Code  1- Verbalizes Understanding      Diabetes: - Individual verbal and written instruction to review signs/symptoms of diabetes, desired ranges of glucose level fasting, after meals and with exercise. Advice that pre and post exercise glucose checks will be done for 3 sessions at entry of program.   Chronic Lung Diseases: - Group verbal and written instruction to review updates, respiratory medications, advancements in procedures and treatments. Discuss use of supplemental oxygen including available portable oxygen systems, continuous and intermittent flow rates, concentrators, personal use and safety guidelines. Review proper use of inhaler and spacers. Provide informative websites for self-education.    Energy Conservation: - Provide group verbal and written instruction for methods to conserve energy, plan and organize activities. Instruct on pacing techniques, use of adaptive equipment and posture/positioning to relieve shortness of breath.   Triggers and Exacerbations: - Group verbal and written instruction to review types of environmental triggers and ways to prevent exacerbations. Discuss weather changes, air quality and the benefits of nasal washing. Review warning signs  and symptoms to help prevent infections. Discuss techniques for effective airway clearance, coughing, and vibrations.   AED/CPR: - Group verbal and written instruction with the use of models to demonstrate the basic use of the AED with the basic ABC's of resuscitation.   Anatomy and Physiology of the Lungs: - Group verbal and written instruction with the use of models to provide basic lung anatomy and physiology related to function,  structure and complications of lung disease.   Anatomy & Physiology of the Heart: - Group verbal and written instruction and models provide basic cardiac anatomy and physiology, with the coronary electrical and arterial systems. Review of Valvular disease and Heart Failure   Cardiac Medications: - Group verbal and written instruction to review commonly prescribed medications for heart disease. Reviews the medication, class of the drug, and side effects.   Know Your Numbers and Risk Factors: -Group verbal and written instruction about important numbers in your health.  Discussion of what are risk factors and how they play a role in the disease process.  Review of Cholesterol, Blood Pressure, Diabetes, and BMI and the role they play in your overall health.   Sleep Hygiene: -Provides group verbal and written instruction about how sleep can affect your health.  Define sleep hygiene, discuss sleep cycles and impact of sleep habits. Review good sleep hygiene tips.    Other: -Provides group and verbal instruction on various topics (see comments)    Knowledge Questionnaire Score: Knowledge Questionnaire Score - 06/25/17 1508      Knowledge Questionnaire Score   Pre Score  13/18 reviewed with patient        Core Components/Risk Factors/Patient Goals at Admission: Personal Goals and Risk Factors at Admission - 06/25/17 1547      Core Components/Risk Factors/Patient Goals on Admission    Weight Management  Yes;Weight Loss    Intervention  Weight  Management: Develop a combined nutrition and exercise program designed to reach desired caloric intake, while maintaining appropriate intake of nutrient and fiber, sodium and fats, and appropriate energy expenditure required for the weight goal.;Weight Management: Provide education and appropriate resources to help participant work on and attain dietary goals.;Weight Management/Obesity: Establish reasonable short term and long term weight goals.    Admit Weight  132 lb 6.4 oz (60.1 kg)    Goal Weight: Short Term  127 lb (57.6 kg)    Goal Weight: Long Term  120 lb (54.4 kg)    Expected Outcomes  Long Term: Adherence to nutrition and physical activity/exercise program aimed toward attainment of established weight goal;Weight Loss: Understanding of general recommendations for a balanced deficit meal plan, which promotes 1-2 lb weight loss per week and includes a negative energy balance of [phone] kcal/d;Understanding recommendations for meals to include 15-35% energy as protein, 25-35% energy from fat, 35-60% energy from carbohydrates, less than 271m of dietary cholesterol, 20-35 gm of total fiber daily;Understanding of distribution of calorie intake throughout the day with the consumption of 4-5 meals/snacks;Weight Maintenance: Understanding of the daily nutrition guidelines, which includes 25-35% calories from fat, 7% or less cal from saturated fats, less than 2033mcholesterol, less than 1.5gm of sodium, & 5 or more servings of fruits and vegetables daily    Improve shortness of breath with ADL's  Yes    Intervention  Provide education, individualized exercise plan and daily activity instruction to help decrease symptoms of SOB with activities of daily living.    Expected Outcomes  Short Term: Improve cardiorespiratory fitness to achieve a reduction of symptoms when performing ADLs    Hypertension  Yes    Intervention  Provide education on lifestyle modifcations including regular physical  activity/exercise, weight management, moderate sodium restriction and increased consumption of fresh fruit, vegetables, and low fat dairy, alcohol moderation, and smoking cessation.;Monitor prescription use compliance.    Expected Outcomes  Short Term: Continued assessment and intervention until BP is < 140/907mG in hypertensive participants. <  130/76m HG in hypertensive participants with diabetes, heart failure or chronic kidney disease.;Long Term: Maintenance of blood pressure at goal levels.       Core Components/Risk Factors/Patient Goals Review:    Core Components/Risk Factors/Patient Goals at Discharge (Final Review):    ITP Comments: ITP Comments    Row Name 06/25/17 1416 07/02/17 0917 07/02/17 1056 07/11/17 1158 07/11/17 1200   ITP Comments  Medical Evaluation completed. Chart sent for review and changes to Dr. MEmily FilbertDirector of [place] Diagnosis can be found in [facility] encounter 06/13/17  30 day review completed. ITP sent to Dr. MEmily FilbertDirector of [place] Continue with ITP unless changes are made by physician.  First full day of exercise!  Patient was oriented to gym and equipment including functions, settings, policies, and procedures.  Patient's individual exercise prescription and treatment plan were reviewed.  All starting workloads were established based on the results of the 6 minute walk test done at initial orientation visit.  The plan for exercise progression was also introduced and progression will be customized based on patient's performance and goals.  Patient called to say that her husband does not want to return to the program. She states that she does not drive. Informed patient that if she wants to join [facility] the future she would need a referral from her doctor. Patient verbalizes understanding.  Discharge ITP sent and signed by Dr. MSabra Heck  Discharge Summary routed to PCP and Pulmonologist.      Comments: Discharge ITP

## 2017-07-11 NOTE — Telephone Encounter (Signed)
Patient called to say that her husband does not want to return to the program. She states that she does not drive. Informed patient that if she wants to join [facility] in the future she would need a referral from her doctor. Patient verbalizes understanding.

## 2017-07-11 NOTE — Patient Instructions (Addendum)
Reduce advair to once daily, continue spiriva daily.  Will refer to dentist to see if a dental device is covered, if not or unsuccessful, then call us back and will start on CPAP.

## 2017-07-11 NOTE — Addendum Note (Signed)
Addended by: Stephanie Coup on: 07/11/2017 12:30 PM   Modules accepted: Orders

## 2017-07-11 NOTE — Progress Notes (Signed)
Discharge Progress Report  Patient Details  Name: Bailey Flores MRN: [id number] Date of Birth: 07/16/1960 Referring Provider:     Pulmonary Rehab from 06/25/2017 in [facility] Cardiac and Pulmonary Rehab  Referring Provider  [name] MD       Number of Visits: 3/36  Reason for Discharge:  Early Exit:  Personal  Smoking History:  Social History   Tobacco Use  Smoking Status Former Smoker  . Packs/day: 2.00  . Years: 37.00  . Pack years: 74.00  . Types: Cigarettes  . Last attempt to quit: 07/19/2012  . Years since quitting: 4.9  Smokeless Tobacco Never Used    Diagnosis:  Chronic obstructive pulmonary disease, unspecified COPD type ([place])  ADL UCSD: Pulmonary Assessment Scores    Row Name 06/25/17 1507         ADL UCSD   ADL Phase  Entry     SOB Score total  43     Rest  0     Walk  2     Stairs  4     Bath  2     Dress  0     Shop  2       CAT Score   CAT Score  19       mMRC Score   mMRC Score  3        Initial Exercise Prescription: Initial Exercise Prescription - 06/25/17 1600      Date of Initial Exercise RX and Referring Provider   Date  06/25/17    Referring Provider  [name] MD      Treadmill   MPH  2.6    Grade  0.5    Minutes  15    METs  3.17      NuStep   Level  3    SPM  80    Minutes  15    METs  2.5      Recumbant Elliptical   Level  1    RPM  50    Minutes  15    METs  2.5      Prescription Details   Frequency (times per week)  3    Duration  Progress to 45 minutes of aerobic exercise without signs/symptoms of physical distress      Intensity   THRR 40-80% of Max Heartrate  117-146    Ratings of Perceived Exertion  11-13    Perceived Dyspnea  0-4      Progression   Progression  Continue to progress workloads to maintain intensity without signs/symptoms of physical distress.      Resistance Training   Training Prescription  Yes    Weight  3 lbs    Reps  10-15       Discharge Exercise  Prescription (Final Exercise Prescription Changes): Exercise Prescription Changes - 07/04/17 1400      Response to Exercise   Blood Pressure (Admit)  124/72    Blood Pressure (Exercise)  144/78    Blood Pressure (Exit)  130/64    Heart Rate (Admit)  82 bpm    Heart Rate (Exercise)  142 bpm    Heart Rate (Exit)  102 bpm    Oxygen Saturation (Admit)  97 %    Oxygen Saturation (Exercise)  93 %    Oxygen Saturation (Exit)  96 %    Rating of Perceived Exertion (Exercise)  16    Perceived Dyspnea (Exercise)  2    Duration  Continue  with 45 min of aerobic exercise without signs/symptoms of physical distress.    Intensity  THRR unchanged      Progression   Progression  Continue to progress workloads to maintain intensity without signs/symptoms of physical distress.    Average METs  2.64      Resistance Training   Training Prescription  Yes    Weight  3 lbs    Reps  10-15      Interval Training   Interval Training  No      Treadmill   MPH  2.6    Grade  0.5    Minutes  15    METs  3.17      Recumbant Elliptical   Level  1    RPM  50    Minutes  15    METs  2.1       Functional Capacity: 6 Minute Walk    Row Name 06/25/17 1650         6 Minute Walk   Distance  1406 feet     Walk Time  6 minutes     # of Rest Breaks  0     MPH  2.66     METS  3.98     RPE  10     Perceived Dyspnea   1     VO2 Peak  13.94     Symptoms  Yes (comment)     Comments  leg pain (possibly sciattica) 5/10     Resting HR  87 bpm     Resting BP  124/74     Resting Oxygen Saturation   97 %     Exercise Oxygen Saturation  during 6 min walk  91 %     Max Ex. HR  118 bpm     Max Ex. BP  162/84     2 Minute Post BP  146/74       Interval HR   1 Minute HR  100     2 Minute HR  107     3 Minute HR  111     4 Minute HR  112     5 Minute HR  114     6 Minute HR  118     2 Minute Post HR  89     Interval Heart Rate?  Yes       Interval Oxygen   Interval Oxygen?  Yes     Baseline  Oxygen Saturation %  97 %     1 Minute Oxygen Saturation %  93 %     1 Minute Liters of Oxygen  0 L Room Air     2 Minute Oxygen Saturation %  91 %     2 Minute Liters of Oxygen  0 L     3 Minute Oxygen Saturation %  94 %     3 Minute Liters of Oxygen  0 L     4 Minute Oxygen Saturation %  93 %     4 Minute Liters of Oxygen  0 L     5 Minute Oxygen Saturation %  94 %     5 Minute Liters of Oxygen  0 L     6 Minute Oxygen Saturation %  93 %     6 Minute Liters of Oxygen  0 L     2 Minute Post Oxygen Saturation %  97 %     2 Minute Post Liters  of Oxygen  0 L        Psychological, QOL, Others - Outcomes: PHQ 2/9: Depression screen PHQ 2/9 06/25/2017  Decreased Interest 2  Down, Depressed, Hopeless 2  PHQ - 2 Score 4  Altered sleeping 3  Tired, decreased energy 3  Change in appetite 3  Feeling bad or failure about yourself  2  Trouble concentrating 2  Moving slowly or fidgety/restless 2  Suicidal thoughts 0  PHQ-9 Score 19  Difficult doing work/chores Somewhat difficult    Quality of Life:   Personal Goals: Goals established at orientation with interventions provided to work toward goal. Personal Goals and Risk Factors at Admission - 06/25/17 1547      Core Components/Risk Factors/Patient Goals on Admission    Weight Management  Yes;Weight Loss    Intervention  Weight Management: Develop a combined nutrition and exercise program designed to reach desired caloric intake, while maintaining appropriate intake of nutrient and fiber, sodium and fats, and appropriate energy expenditure required for the weight goal.;Weight Management: Provide education and appropriate resources to help participant work on and attain dietary goals.;Weight Management/Obesity: Establish reasonable short term and long term weight goals.    Admit Weight  132 lb 6.4 oz (60.1 kg)    Goal Weight: Short Term  127 lb (57.6 kg)    Goal Weight: Long Term  120 lb (54.4 kg)    Expected Outcomes  Long Term:  Adherence to nutrition and physical activity/exercise program aimed toward attainment of established weight goal;Weight Loss: Understanding of general recommendations for a balanced deficit meal plan, which promotes 1-2 lb weight loss per week and includes a negative energy balance of [phone] kcal/d;Understanding recommendations for meals to include 15-35% energy as protein, 25-35% energy from fat, 35-60% energy from carbohydrates, less than 200mg  of dietary cholesterol, 20-35 gm of total fiber daily;Understanding of distribution of calorie intake throughout the day with the consumption of 4-5 meals/snacks;Weight Maintenance: Understanding of the daily nutrition guidelines, which includes 25-35% calories from fat, 7% or less cal from saturated fats, less than 200mg  cholesterol, less than 1.5gm of sodium, & 5 or more servings of fruits and vegetables daily    Improve shortness of breath with ADL's  Yes    Intervention  Provide education, individualized exercise plan and daily activity instruction to help decrease symptoms of SOB with activities of daily living.    Expected Outcomes  Short Term: Improve cardiorespiratory fitness to achieve a reduction of symptoms when performing ADLs    Hypertension  Yes    Intervention  Provide education on lifestyle modifcations including regular physical activity/exercise, weight management, moderate sodium restriction and increased consumption of fresh fruit, vegetables, and low fat dairy, alcohol moderation, and smoking cessation.;Monitor prescription use compliance.    Expected Outcomes  Short Term: Continued assessment and intervention until BP is < 140/67mm HG in hypertensive participants. < 130/58mm HG in hypertensive participants with diabetes, heart failure or chronic kidney disease.;Long Term: Maintenance of blood pressure at goal levels.        Personal Goals Discharge:   Exercise Goals and Review: Exercise Goals    Row Name 06/25/17 1655              Exercise Goals   Increase Physical Activity  Yes       Intervention  Provide advice, education, support and counseling about physical activity/exercise needs.;Develop an individualized exercise prescription for aerobic and resistive training based on initial evaluation findings, risk stratification, comorbidities and participant's personal goals.  Expected Outcomes  Short Term: Attend rehab on a regular basis to increase amount of physical activity.;Long Term: Add in home exercise to make exercise part of routine and to increase amount of physical activity.;Long Term: Exercising regularly at least 3-5 days a week.       Increase Strength and Stamina  Yes       Intervention  Provide advice, education, support and counseling about physical activity/exercise needs.;Develop an individualized exercise prescription for aerobic and resistive training based on initial evaluation findings, risk stratification, comorbidities and participant's personal goals.       Expected Outcomes  Short Term: Increase workloads from initial exercise prescription for resistance, speed, and METs.;Short Term: Perform resistance training exercises routinely during rehab and add in resistance training at home;Long Term: Improve cardiorespiratory fitness, muscular endurance and strength as measured by increased METs and functional capacity (6MWT)       Able to understand and use rate of perceived exertion (RPE) scale  Yes       Intervention  Provide education and explanation on how to use RPE scale       Expected Outcomes  Short Term: Able to use RPE daily in rehab to express subjective intensity level;Long Term:  Able to use RPE to guide intensity level when exercising independently       Able to understand and use Dyspnea scale  Yes       Intervention  Provide education and explanation on how to use Dyspnea scale       Expected Outcomes  Short Term: Able to use Dyspnea scale daily in rehab to express subjective sense of  shortness of breath during exertion;Long Term: Able to use Dyspnea scale to guide intensity level when exercising independently       Knowledge and understanding of Target Heart Rate Range (THRR)  Yes       Intervention  Provide education and explanation of THRR including how the numbers were predicted and where they are located for reference       Expected Outcomes  Short Term: Able to state/look up THRR;Long Term: Able to use THRR to govern intensity when exercising independently;Short Term: Able to use daily as guideline for intensity in rehab       Able to check pulse independently  Yes       Intervention  Provide education and demonstration on how to check pulse in carotid and radial arteries.;Review the importance of being able to check your own pulse for safety during independent exercise       Expected Outcomes  Short Term: Able to explain why pulse checking is important during independent exercise;Long Term: Able to check pulse independently and accurately       Understanding of Exercise Prescription  Yes       Intervention  Provide education, explanation, and written materials on patient's individual exercise prescription       Expected Outcomes  Long Term: Able to explain home exercise prescription to exercise independently;Short Term: Able to explain program exercise prescription          Nutrition & Weight - Outcomes: Pre Biometrics - 06/25/17 1655      Pre Biometrics   Height  4' 10.25" (1.48 m)    Weight  132 lb 6.4 oz (60.1 kg)    Waist Circumference  32.5 inches    Hip Circumference  38 inches    Waist to Hip Ratio  0.86 %    BMI (Calculated)  27.[place]  Nutrition: Nutrition Therapy & Goals - 06/25/17 1503      Personal Nutrition Goals   Comments  would like to lose weight and eat healthier      Intervention Plan   Intervention  Prescribe, educate and counsel regarding individualized specific dietary modifications aiming towards targeted core components such as  weight, hypertension, lipid management, diabetes, heart failure and other comorbidities.;Nutrition handout(s) given to patient.    Expected Outcomes  Short Term Goal: Understand basic principles of dietary content, such as calories, fat, sodium, cholesterol and nutrients.;Long Term Goal: Adherence to prescribed nutrition plan.       Nutrition Discharge: Nutrition Assessments - 06/25/17 1501      MEDFICTS Scores   Pre Score  107       Education Questionnaire Score: Knowledge Questionnaire Score - 06/25/17 1508      Knowledge Questionnaire Score   Pre Score  13/18 reviewed with patient       Goals reviewed with patient; copy given to patient.

## 2017-07-13 ENCOUNTER — Encounter: Payer: Self-pay | Admitting: Psychiatry

## 2017-07-13 ENCOUNTER — Ambulatory Visit (INDEPENDENT_AMBULATORY_CARE_PROVIDER_SITE_OTHER): Admitting: Psychiatry

## 2017-07-13 ENCOUNTER — Other Ambulatory Visit: Payer: Self-pay

## 2017-07-13 VITALS — BP 146/83 | HR 101 | Temp 97.9°F | Wt 130.5 lb

## 2017-07-13 DIAGNOSIS — F3131 Bipolar disorder, current episode depressed, mild: Secondary | ICD-10-CM

## 2017-07-13 DIAGNOSIS — F909 Attention-deficit hyperactivity disorder, unspecified type: Secondary | ICD-10-CM | POA: Diagnosis not present

## 2017-07-13 MED ORDER — ARIPIPRAZOLE 5 MG PO TABS: 5.0000 mg | ORAL_TABLET | Freq: Every day | ORAL | 1 refills | Status: DC

## 2017-07-13 NOTE — Progress Notes (Signed)
[place] MD OP Progress Note  07/13/2017 12:47 PM Bailey Flores  MRN:  [id number]  Chief Complaint: ' I am not able to take the [facility].' Chief Complaint    Follow-up; Medication Refill     HPI: Bailey Flores is a 57 year old female who is married, unemployed, has a history of bipolar disorder, ADHD, cognitive disorder unspecified, COPD, hypertension, presented to the clinic today for a follow-up visit.  Patient today presented along with her husband who also provided collateral information.  Patient reports she was unable to be compliant on the [place] as prescribed.  She reports she does not have supper at the same time every night and hence she has been forgetting to take it with food.  She reports she continues to have mood lability.  She reports she is taking the lamotrigine and the Wellbutrin as prescribed.  She denies any side effects to her medications.  She continues to have problems with her attention and focus.  She reports she is hyperactive and unable to stay organized.  She reports she forgot her appointment today and hence she is late , husband had to remind her.  She wonders whether she can get her Vyvanse refilled.  Discussed with patient that since we did not get any records from her previous provider who was prescribing it to her she will need to sign a release or obtain medical records from her previous doctor.  Discussed with her that Vyvanse is a controlled substance and we will need her ADHD testing and treatment records from her previous provider.  She agrees to do so.  She denies any other concerns today.  She and her husband are currently thinking about moving to [place].  She reports that she continues to worry about that.  Her husband continues to be supportive. Visit Diagnosis:    ICD-10-CM   1. Bipolar disorder, current episode depressed, mild (HCC) F31.31 ARIPiprazole (ABILIFY) 5 MG tablet  2. Attention deficit hyperactivity disorder (ADHD), unspecified ADHD type F90.9      Past Psychiatric History: History of bipolar disorder-diagnosed several years ago.  She reports inpatient mental health admission in [place] x1 in the past.  She reports at least 2 suicide attempts in the past.  She reports previous trials of medications like Prozac, Abilify.  She reports Prozac was tried 2 months ago or so and it made her more anxious.  Past Medical History:  Past Medical History:  Diagnosis Date  . Arthritis    hands  . Asthma   . Barrett's esophagus   . [facility])    neuroendocrine ca  . COPD (chronic obstructive pulmonary disease) ([place])   . Hypertension   . Motion sickness    reading in car    Past Surgical History:  Procedure Laterality Date  . ABDOMINAL HYSTERECTOMY    . BOWEL RESECTION  07/19/2012  . CESAREAN SECTION     x3  . ESOPHAGOGASTRODUODENOSCOPY (EGD) WITH PROPOFOL N/A 03/05/2017   Procedure: ESOPHAGOGASTRODUODENOSCOPY (EGD) WITH PROPOFOL;  Surgeon: [name], MD;  Location: [facility];  Service: Endoscopy;  Laterality: N/A;  . ESOPHAGOGASTRODUODENOSCOPY (EGD) WITH PROPOFOL N/A 03/30/2017   REPEAT IN 03/2019  . HAND SURGERY Right     Family Psychiatric History: She has twin children-both adults-have mental illness.  Her daughter who is one of the twins has bipolar disorder.  Family History:  Family History  Problem Relation Age of Onset  . Breast cancer Cousin        mat cousin   Substance  abuse history: Denies  Social History: She was born in [place].  She was raised in [place].  She lives in [place] with her ex-husband in [place] in the past.  She got divorced.  She is currently married to her current husband, they have been together since the past 15 years or so.  She has 4 children, all adults.  Her oldest daughter is in [place], she was from previous relationship.  She is doing well.  Her twins currently lives with her in the same house.  Her husband recently lost his job as an Building surveyor. Social  History   Socioeconomic History  . Marital status: Married    Spouse name: None  . Number of children: None  . Years of education: None  . Highest education level: None  Social Needs  . Financial resource strain: None  . Food insecurity - worry: None  . Food insecurity - inability: None  . Transportation needs - medical: None  . Transportation needs - non-medical: None  Occupational History  . None  Tobacco Use  . Smoking status: Former Smoker    Packs/day: 2.00    Years: 37.00    Pack years: 74.00    Types: Cigarettes    Last attempt to quit: 07/19/2012    Years since quitting: 4.9  . Smokeless tobacco: Never Used  Substance and Sexual Activity  . Alcohol use: Yes    Comment: 2 drinks/mo  . Drug use: No  . Sexual activity: Yes  Other Topics Concern  . None  Social History Narrative  . None    Allergies:  Allergies  Allergen Reactions  . Hydrocodone-Acetaminophen Itching    Metabolic Disorder Labs: Lab Results  Component Value Date   HGBA1C 5.9 (H) 06/13/2017   MPG 122.63 06/13/2017   Lab Results  Component Value Date   PROLACTIN 4.6 (L) 06/13/2017   Lab Results  Component Value Date   CHOL 183 06/13/2017   TRIG 80 06/13/2017   HDL 92 06/13/2017   CHOLHDL 2.0 06/13/2017   VLDL 16 06/13/2017   LDLCALC 75 06/13/2017   Lab Results  Component Value Date   TSH 1.205 06/13/2017    Therapeutic Level Labs: No results found for: LITHIUM No results found for: VALPROATE No components found for:  CBMZ  Current Medications: Current Outpatient Medications  Medication Sig Dispense Refill  . albuterol (PROVENTIL HFA;VENTOLIN HFA) 108 (90 Base) MCG/ACT inhaler Inhale into the lungs.    Marland Kitchen buPROPion (WELLBUTRIN XL) 300 MG 24 hr tablet Take 1 tablet (300 mg total) by mouth every morning. 30 tablet 1  . Fluticasone-Salmeterol (ADVAIR DISKUS) 250-50 MCG/DOSE AEPB Inhale 1 puff into the lungs 2 (two) times daily.     Marland Kitchen ibuprofen (ADVIL,MOTRIN) 800 MG tablet Take  by mouth.    Marland Kitchen ipratropium-albuterol (DUONEB) 0.5-2.5 (3) MG/3ML SOLN Inhale into the lungs.    . lamoTRIgine (LAMICTAL) 100 MG tablet Take 100 mg by mouth 2 (two) times daily. 100mg  qam and 200 mg qpm    . lisdexamfetamine (VYVANSE) 70 MG capsule Take by mouth.    . losartan-hydrochlorothiazide (HYZAAR) 50-12.5 MG tablet Take by mouth.    . lubiprostone (AMITIZA) 24 MCG capsule Take 24 mcg by mouth daily with breakfast.     . MAGNESIUM PO Take by mouth daily.    . Melatonin 10 MG TABS Take by mouth.    . montelukast (SINGULAIR) 10 MG tablet Take 10 mg by mouth at bedtime.     . Multiple  Vitamins-Minerals (CENTRUM SILVER PO) Take by mouth daily.    Marland Kitchen omeprazole (PRILOSEC) 40 MG capsule Take by mouth.    . SPIRIVA RESPIMAT 2.5 MCG/ACT AERS Inhale 2 puffs into the lungs at bedtime.    . valACYclovir (VALTREX) 1000 MG tablet Take by mouth.    . ARIPiprazole (ABILIFY) 5 MG tablet Take 1 tablet (5 mg total) by mouth daily. 30 tablet 1  . gabapentin (NEURONTIN) 300 MG capsule Take 300 mg 3 (three) times daily by mouth.      No current facility-administered medications for this visit.      Musculoskeletal: Strength & Muscle Tone: within normal limits Gait & Station: normal Patient leans: N/A  Psychiatric Specialty Exam: Review of Systems  Psychiatric/Behavioral: The patient is nervous/anxious.   All other systems reviewed and are negative.   Blood pressure (!) 146/83, pulse (!) 101, temperature 97.9 F (36.6 C), temperature source Oral, weight 130 lb 8 oz (59.2 kg).Body mass index is 26.9 kg/m.  General Appearance: Casual  Eye Contact:  Fair  Speech:  Normal Rate  Volume:  Normal  Mood:  Anxious  Affect:  Congruent  Thought Process:  Goal Directed and Descriptions of Associations: Intact  Orientation:  Full (Time, Place, and Person)  Thought Content: Logical   Suicidal Thoughts:  No  Homicidal Thoughts:  No  Memory:  Immediate;   Fair Recent;   Fair Remote;   Fair  Judgement:   Fair  Insight:  Fair  Psychomotor Activity:  Normal  Concentration:  Concentration: Fair and Attention Span: Fair  Recall:  [employer] of Knowledge: Fair  Language: Fair  Akathisia:  No  Handed:  Right  AIMS (if indicated): denies tremors, rigidity  Assets:  Communication Skills Desire for Improvement Housing Intimacy Social Support  ADL's:  Intact  Cognition: WNL  Sleep:  Fair   Screenings: PHQ2-9     Pulmonary Rehab from 06/25/2017 in [facility]  PHQ-2 Total Score  4  PHQ-9 Total Score  19       Assessment and Plan: Bailey Flores is a 57 year old female who has a history of bipolar disorder, ADHD, cognitive disorder, COPD, GERD, presented to the clinic today for a follow-up visit.  Kaneshia continues to psychosocial stressors of her husband losing his job, financial stressors, her own mental health issues as well as mental health issues of her children, as well as upcoming relocation to [place].  She continues to have good social support from her husband who is here with her today.  She was not able to obtain medical records from her previous provider who was prescribing her Vyvanse.  Discussed with her that Vyvanse cannot be continued unless we get record from her previous provider.  Also discussed changing her Latuda to Abilify since she tried Abilify in the past and does not remember having any side effects.  Plan as noted below.  Plan Bipolar disorder Continue lamotrigine as prescribed.  She reports she is on 300 mg daily We will continue Wellbutrin XL 300 mg p.o. daily. Discontinue Latuda for noncompliance. Start Abilify 5 mg p.o. nightly Provided medication education, provided handouts.  Anxiety symptoms Continue CBT with Ms. Peacock. Continue hydroxyzine 25 mg p.o. Prn  For insomnia Continue melatonin Continue hydroxyzine 25-50 mg as needed  ADHD per history Discussed to obtain medical records from her previous provider She reports she is on  Vyvanse 70 mg p.o. Daily.  Cognitive disorder Unclear what kind of cognitive issues she had in the  past.  She however  is alert oriented and is able to give personal information and details about medication without any problems. Will continue to monitor Vitamin B12-reviewed- 06/13/2017- 1,429 TSH-within normal limits-06/13/2017, prolactin-4.6-within normal limits, folate-31-within normal limits,hemoglobin A1c-5.9-slightly elevated.  CBC with differential-within normal limits, panel-within normal limits, [id number], otherwise within normal limits.  Patient was called and all her labs discussed prior to this visit today.  Patient advised to follow-up with PMD.( 06/13/2017).  Follow up in clinic in 4 weeks or sooner if needed.  Continue CBT with Ms. Peacock.  More than 50 % of the time was spent for psychoeducation and supportive psychotherapy and care coordination.  This note was generated in part or whole with voice recognition software. Voice recognition is usually quite accurate but there are transcription errors that can and very often do occur. I apologize for any typographical errors that were not detected and corrected.          [name], MD 07/13/2017, 12:47 PM

## 2017-07-13 NOTE — Patient Instructions (Signed)
Aripiprazole tablets What is this medicine? ARIPIPRAZOLE (ay ri PIP ray zole) is an atypical antipsychotic. It is used to treat schizophrenia and bipolar disorder, also known as manic-depression. It is also used to treat Tourette's disorder and some symptoms of autism. This medicine may also be used in combination with antidepressants to treat major depressive disorder. This medicine may be used for other purposes; ask your health care provider or pharmacist if you have questions. COMMON BRAND NAME(S): Abilify What should I tell my health care provider before I take this medicine? They need to know if you have any of these conditions: -dehydration -dementia -diabetes -heart disease -history of stroke -low blood counts, like low white cell, platelet, or red cell counts -Parkinson's disease -seizures -suicidal thoughts, plans, or attempt; a previous suicide attempt by you or a family member -an unusual or allergic reaction to aripiprazole, other medicines, foods, dyes, or preservatives -pregnant or trying to get pregnant -breast-feeding How should I use this medicine? Take this medicine by mouth with a glass of water. Follow the directions on the prescription label. You can take this medicine with or without food. Take your doses at regular intervals. Do not take your medicine more often than directed. Do not stop taking except on the advice of your doctor or health care professional. A special MedGuide will be given to you by the pharmacist with each prescription and refill. Be sure to read this information carefully each time. Talk to your pediatrician regarding the use of this medicine in children. While this drug may be prescribed for children as young as 6 years of age for selected conditions, precautions do apply. Overdosage: If you think you have taken too much of this medicine contact a poison control center or emergency room at once. NOTE: This medicine is only for you. Do not share  this medicine with others. What if I miss a dose? If you miss a dose, take it as soon as you can. If it is almost time for your next dose, take only that dose. Do not take double or extra doses. What may interact with this medicine? Do not take this medicine with any of the following medications: -brexpiprazole -cisapride -dofetilide -dronedarone -metoclopramide -pimozide -thioridazine This medicine may also interact with the following medications: -alcohol -carbamazepine -certain medicines for anxiety or sleep -certain medicines for blood pressure -certain medicines for fungal infections like ketoconazole, fluconazole, posaconazole, and itraconazole -clarithromycin -fluoxetine -other medicines that prolong the QT interval (cause an abnormal heart rhythm) -paroxetine -quinidine -rifampin This list may not describe all possible interactions. Give your health care provider a list of all the medicines, herbs, non-prescription drugs, or dietary supplements you use. Also tell them if you smoke, drink alcohol, or use illegal drugs. Some items may interact with your medicine. What should I watch for while using this medicine? Visit your doctor or health care professional for regular checks on your progress. It may be several weeks before you see the full effects of this medicine. Do not suddenly stop taking this medicine. You may need to gradually reduce the dose. Patients and their families should watch out for worsening depression or thoughts of suicide. Also watch out for sudden changes in feelings such as feeling anxious, agitated, panicky, irritable, hostile, aggressive, impulsive, severely restless, overly excited and hyperactive, or not being able to sleep. If this happens, especially at the beginning of antidepressant treatment or after a change in dose, call your health care professional. You may get dizzy or drowsy. Do   not drive, use machinery, or do anything that needs mental  alertness until you know how this medicine affects you. Do not stand or sit up quickly, especially if you are an older patient. This reduces the risk of dizzy or fainting spells. Alcohol can increase dizziness and drowsiness. Avoid alcoholic drinks. This medicine can reduce the response of your body to heat or cold. Dress warm in cold weather and stay hydrated in hot weather. If possible, avoid extreme temperatures like saunas, hot tubs, very hot or cold showers, or activities that can cause dehydration such as vigorous exercise. This medicine may cause dry eyes and blurred vision. If you wear contact lenses you may feel some discomfort. Lubricating drops may help. See your eye doctor if the problem does not go away or is severe. If you notice an increased hunger or thirst, different from your normal hunger or thirst, or if you find that you have to urinate more frequently, you should contact your health care provider as soon as possible. You may need to have your blood sugar monitored. This medicine may cause changes in your blood sugar levels. You should monitor you blood sugar frequently if you have diabetes. There have been reports of uncontrollable and strong urges to gamble, binge eat, shop, and have sex while taking this medicine. If you experience any of these or other uncontrollable and strong urges while taking this medicine, you should report it to your health care provider as soon as possible. What side effects may I notice from receiving this medicine? Side effects that you should report to your doctor or health care professional as soon as possible: -allergic reactions like skin rash, itching or hives, swelling of the face, lips, or tongue -breathing problems -confusion -feeling faint or lightheaded, falls -fever or chills, sore throat -increased hunger or thirst -increased urination -joint pain -muscles pain, spasms -problems with balance, talking, walking -restlessness or need to  keep moving -seizures -suicidal thoughts or other mood changes -trouble swallowing -uncontrollable and excessive urges (examples: gambling, binge eating, shopping, having sex) -uncontrollable head, mouth, neck, arm, or leg movements -unusually weak or tired Side effects that usually do not require medical attention (report to your doctor or health care professional if they continue or are bothersome): -blurred vision -constipation -headache -nausea, vomiting -trouble sleeping -weight gain This list may not describe all possible side effects. Call your doctor for medical advice about side effects. You may report side effects to FDA at 1-800-FDA-1088. Where should I keep my medicine? Keep out of the reach of children. Store at room temperature between 15 and 30 degrees C (59 and 86 degrees F). Throw away any unused medicine after the expiration date. NOTE: This sheet is a summary. It may not cover all possible information. If you have questions about this medicine, talk to your doctor, pharmacist, or health care provider.  2018 Elsevier/Gold Standard (2016-04-23 11:45:05)  

## 2017-07-16 ENCOUNTER — Telehealth: Payer: Self-pay | Admitting: Psychiatry

## 2017-07-16 DIAGNOSIS — F900 Attention-deficit hyperactivity disorder, predominantly inattentive type: Secondary | ICD-10-CM

## 2017-07-16 MED ORDER — LISDEXAMFETAMINE DIMESYLATE 70 MG PO CAPS: 70.0000 mg | ORAL_CAPSULE | Freq: Every day | ORAL | 0 refills | Status: DC

## 2017-07-16 NOTE — Telephone Encounter (Signed)
reviewed records received from her previous provider , will restart Vyvanse 70 mg. Also reviewed [place] controlled substance database.

## 2017-07-17 DIAGNOSIS — Z7189 Other specified counseling: Secondary | ICD-10-CM | POA: Insufficient documentation

## 2017-07-17 NOTE — Progress Notes (Deleted)
[facility] day:  07/17/2017  Chief Complaint: Bailey Flores is a 57 y.o. 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  HPI: The patient was was last seen in the medical oncology clinic on 04/18/2017.  At that time, she was doing well.  She had chronic post-operative RUQ pain.  She denied any flushing or diarrhea.  Exam was stable. CBC and CMP were normal.  Chromogranin was 9 (0-5).  24 hour urine for 5HIAA was 4.9 (normal).  Outside records were requested.  Low dose chest CT on 05/01/2017 was Lung-RADS 1, negative. Continue annual screening with low-dose chest CT without contrast in 12 months was recommended.  There was emphysema and LAD coronary artery calcification.  She was referred to pulmonary medicine.  She was seen by Dr. [name] (last 07/11/2017).  She was on maximum therapy with Advair and Spiriva.  Prevnar 13 was administered.  Home sleep study showed an AHI of 20.  She did not want CPAP and thus was referred to the dentist.  She was referred to pulmonary rehab for deconditioning.  During the interim,   Past Medical History:  Diagnosis Date  . Arthritis    hands  . Asthma   . Barrett's esophagus   . Cancer [facility])    neuroendocrine ca  . COPD (chronic obstructive pulmonary disease) ([place])   . Hypertension   . Motion sickness    reading in car    Past Surgical History:  Procedure Laterality Date  . ABDOMINAL HYSTERECTOMY    . BOWEL RESECTION  07/19/2012  . CESAREAN SECTION     x3  . ESOPHAGOGASTRODUODENOSCOPY (EGD) WITH PROPOFOL N/A 03/05/2017   Procedure: ESOPHAGOGASTRODUODENOSCOPY (EGD) WITH PROPOFOL;  Surgeon: [name], MD;  Location: [facility];  Service: Endoscopy;  Laterality: N/A;  . ESOPHAGOGASTRODUODENOSCOPY (EGD) WITH PROPOFOL N/A 03/30/2017   REPEAT IN 03/2019  . HAND SURGERY Right     Family History  Problem Relation Age of Onset  . Breast cancer Cousin        mat  cousin    Social History:  reports that she quit smoking about 4 years ago. Her smoking use included cigarettes. She has a 74.00 pack-year smoking history. she has never used smokeless tobacco. She reports that she drinks alcohol. She reports that she does not use drugs.  She smoked 1 1/2 - 2 packs/day from the 79 (age 28) to 06/2012.  When she was younger, she only smoked 1/2 pack per day.  She is from [place].  She grew up in [place].  She previously lived in [facility], but sought medical attention in [facility] then [place].  She moved to [place] in 07/2016.  She lives in [place].  The patient is alone today.  Allergies:  Allergies  Allergen Reactions  . Hydrocodone-Acetaminophen Itching    Current Medications: Current Outpatient Medications  Medication Sig Dispense Refill  . albuterol (PROVENTIL HFA;VENTOLIN HFA) 108 (90 Base) MCG/ACT inhaler Inhale into the lungs.    . ARIPiprazole (ABILIFY) 5 MG tablet Take 1 tablet (5 mg total) by mouth daily. 30 tablet 1  . buPROPion (WELLBUTRIN XL) 300 MG 24 hr tablet Take 1 tablet (300 mg total) by mouth every morning. 30 tablet 1  . Fluticasone-Salmeterol (ADVAIR DISKUS) 250-50 MCG/DOSE AEPB Inhale 1 puff into the lungs 2 (two) times daily.     Marland Kitchen gabapentin (NEURONTIN) 300 MG capsule Take 300 mg  3 (three) times daily by mouth.     Marland Kitchen ibuprofen (ADVIL,MOTRIN) 800 MG tablet Take by mouth.    Marland Kitchen ipratropium-albuterol (DUONEB) 0.5-2.5 (3) MG/3ML SOLN Inhale into the lungs.    . lamoTRIgine (LAMICTAL) 100 MG tablet Take 100 mg by mouth 2 (two) times daily. 135m qam and 200 mg qpm    . lisdexamfetamine (VYVANSE) 70 MG capsule Take 1 capsule (70 mg total) by mouth daily. 30 capsule 0  . losartan-hydrochlorothiazide (HYZAAR) 50-12.5 MG tablet Take by mouth.    . lubiprostone (AMITIZA) 24 MCG capsule Take 24 mcg by mouth daily with breakfast.     . MAGNESIUM PO Take by mouth daily.    . Melatonin 10 MG TABS Take by mouth.     . montelukast (SINGULAIR) 10 MG tablet Take 10 mg by mouth at bedtime.     . Multiple Vitamins-Minerals (CENTRUM SILVER PO) Take by mouth daily.    .Marland Kitchenomeprazole (PRILOSEC) 40 MG capsule Take by mouth.    . SPIRIVA RESPIMAT 2.5 MCG/ACT AERS Inhale 2 puffs into the lungs at bedtime.    . valACYclovir (VALTREX) 1000 MG tablet Take by mouth.     No current facility-administered medications for this visit.     Review of Systems:  GENERAL:  Feels "well".  No fevers or sweats.  Weight loss of 2 pounds since last visit PERFORMANCE STATUS (ECOG):  1 HEENT:  No visual changes, runny nose, sore throat, mouth sores or tenderness. Lungs: Shortness of breath secondary to COPD.  Would like to see a pulmonologist.  No cough.  No hemoptysis. Cardiac:  No chest pain, palpitations, orthopnea, or PND. Breasts:  No breast concerns.  Mammogram 04/02/2017. GI:  Intermittent nausea.  Constipation unless takes magnesium or Amitiza.  No vomiting, diarrhea, melena or hematochezia.  Last colonoscopy about 9 years ago. GU:  No urgency, frequency, dysuria, or hematuria. Musculoskeletal:  No back pain.  Arthritis in hands and right shoulder.  No muscle tenderness. Extremities:  No pain or swelling. Skin:  No rashes or skin changes. Neuro:  Some memory issues (word finding).  Sometimes loses balance.  No headache, numbness or weakness, or coordination issues. Endocrine:  No diabetes, thyroid issues, hot flashes or night sweats. Psych:  No mood changes, depression or anxiety. Pain:  Chronic pain associated with her RUQ incision.  Notes hight tolerance for pain. Review of systems:  All other systems reviewed and found to be negative.  Physical Exam: There were no vitals taken for this visit. GENERAL:  Well developed, well nourished, woman sitting comfortably in the exam room in no acute distress. MENTAL STATUS:  Alert and oriented to person, place and time. HEAD:  Long black slightly graying hair.  Normocephalic,  atraumatic, face symmetric, no Cushingoid features. EYES:  Glasses.  Brown eyes.  No conjunctivitis or scleral icterus. RESPIRATORY:  Clear to auscultation without rales, wheezes or rhonchi. CARDIOVASCULAR:  Regular rate and rhythm without murmur, rub or gallop. ABDOMEN:  Well healed RUQ incision.  Soft, tender to minimal palpation over scar.  No guarding or rebound tenderness.  Active bowel sounds and no hepatosplenomegaly.  No masses. SKIN:  No rashes, ulcers or lesions. EXTREMITIES: No edema, no skin discoloration or tenderness.  No palpable cords. LYMPH NODES: No palpable cervical, supraclavicular, axillary or inguinal adenopathy  NEUROLOGICAL: Unremarkable. PSYCH:  Appropriate.   No visits with results within 3 Day(s) from this visit.  Latest known visit with results is:  Hospital Outpatient Visit on 06/13/2017  Component  Date Value Ref Range Status  . Sodium 06/13/2017 140  135 - 145 mmol/L Final  . Potassium 06/13/2017 3.8  3.5 - 5.1 mmol/L Final  . Chloride 06/13/2017 104  101 - 111 mmol/L Final  . CO2 06/13/2017 27  22 - 32 mmol/L Final  . Glucose, Bld 06/13/2017 78  65 - 99 mg/dL Final  . BUN 06/13/2017 26* 6 - 20 mg/dL Final  . Creatinine, Ser 06/13/2017 0.78  0.44 - 1.00 mg/dL Final  . Calcium 06/13/2017 9.3  8.9 - 10.3 mg/dL Final  . Total Protein 06/13/2017 7.3  6.5 - 8.1 g/dL Final  . Albumin 06/13/2017 4.2  3.5 - 5.0 g/dL Final  . AST 06/13/2017 26  15 - 41 U/L Final  . ALT 06/13/2017 26  14 - 54 U/L Final  . Alkaline Phosphatase 06/13/2017 72  38 - 126 U/L Final  . Total Bilirubin 06/13/2017 0.4  0.3 - 1.2 mg/dL Final  . GFR calc non Af Amer 06/13/2017 >60  >60 mL/min Final  . GFR calc Af Amer 06/13/2017 >60  >60 mL/min Final   Comment: (NOTE) The eGFR has been calculated using the CKD EPI equation. This calculation has not been validated in all clinical situations. eGFR's persistently <60 mL/min signify possible Chronic Kidney Disease.   Georgiann Hahn gap 06/13/2017  9  5 - 15 Final   Performed at [facility], [address] [place] [address], [place] [address]  . Cholesterol 06/13/2017 183  0 - 200 mg/dL Final  . Triglycerides 06/13/2017 80  <150 mg/dL Final  . HDL 06/13/2017 92  >40 mg/dL Final  . Total CHOL/HDL Ratio 06/13/2017 2.0  RATIO Final  . VLDL 06/13/2017 16  0 - 40 mg/dL Final  . LDL Cholesterol 06/13/2017 75  0 - 99 mg/dL Final   Comment:        Total Cholesterol/HDL:CHD Risk Coronary Heart Disease Risk Table                     Men   Women  1/2 Average Risk   3.4   3.3  Average Risk       5.0   4.4  2 X Average Risk   9.6   7.1  3 X Average Risk  23.4   11.0        Use the calculated Patient Ratio above and the CHD Risk Table to determine the patient's CHD Risk.        ATP III CLASSIFICATION (LDL):  <100     mg/dL   Optimal  100-129  mg/dL   Near or Above                    Optimal  130-159  mg/dL   Borderline  160-189  mg/dL   High  >190     mg/dL   Very High Performed at [facility], [address] [place], [place] [address]   . WBC 06/13/2017 7.5  3.6 - 11.0 K/uL Final  . RBC 06/13/2017 4.38  3.80 - 5.20 MIL/uL Final  . Hemoglobin 06/13/2017 12.7  12.0 - 16.0 g/dL Final  . HCT 06/13/2017 38.8  35.0 - 47.0 % Final  . MCV 06/13/2017 88.6  80.0 - 100.0 fL Final  . MCH 06/13/2017 29.0  26.0 - 34.0 pg Final  . MCHC 06/13/2017 32.7  32.0 - 36.0 g/dL Final  . RDW 06/13/2017 15.2* 11.5 - 14.5 % Final  . Platelets 06/13/2017 335  150 - 440 K/uL Final  . Neutrophils Relative % 06/13/2017 55  % Final  . Neutro Abs 06/13/2017 4.2  1.4 - 6.5 K/uL Final  . Lymphocytes Relative 06/13/2017 34  % Final  . Lymphs Abs 06/13/2017 2.5  1.0 - 3.6 K/uL Final  . Monocytes Relative 06/13/2017 9  % Final  . Monocytes Absolute 06/13/2017 0.7  0.2 - 0.9 K/uL Final  . Eosinophils Relative 06/13/2017 1  % Final  . Eosinophils Absolute 06/13/2017 0.1  0 - 0.7 K/uL Final  . Basophils Relative 06/13/2017 1  % Final  . Basophils  Absolute 06/13/2017 0.0  0 - 0.1 K/uL Final   Performed at [facility], [address] [place], [place] [address]  . Hgb A1c MFr Bld 06/13/2017 5.9* 4.8 - 5.6 % Final   Comment: (NOTE) Pre diabetes:          5.7%-6.4% Diabetes:              >6.4% Glycemic control for   <7.0% adults with diabetes   . Mean Plasma Glucose 06/13/2017 122.63  mg/dL Final   Performed at [facility] [address], [place], [place] [address]  . Prolactin 06/13/2017 4.6* 4.8 - 23.3 ng/mL Final   Comment: (NOTE) Performed At: [facility] [place], [place] [id number] Rush Farmer MD JJ:[id number] Performed at [facility], [address] [place], [place] [address]   . TSH 06/13/2017 1.205  0.350 - 4.500 uIU/mL Final   Comment: Performed by a 3rd Generation assay with a functional sensitivity of <=0.01 uIU/mL. Performed at [facility], [address] [place], [place] [address]   . Vit D, 25-Hydroxy 06/13/2017 22.2* 30.0 - 100.0 ng/mL Final   Comment: (NOTE) Vitamin D deficiency has been defined by the [place] practice guideline as a level of serum 25-OH vitamin D less than 20 ng/mL (1,2). The Endocrine Society went on to further define vitamin D insufficiency as a level between 21 and 29 ng/mL (2). 1. IOM (Institute of Medicine). 2010. Dietary reference   intakes for calcium and D. [place]: The   [employer]. 2. Holick MF, Binkley Williamson, Bischoff-Ferrari HA, et al.   Evaluation, treatment, and prevention of vitamin D   deficiency: an Endocrine Society clinical practice   guideline. JCEM. 2011 Jul; 96(7):1911-30. Performed At: [facility] [place], [place] [id number] Rush Farmer MD YH:[id number] Performed at [facility], [address] [place], [place] [address]   . Vitamin B-12 06/13/2017 1,429* 180 - 914 pg/mL Final   Comment: (NOTE) This  assay is not validated for testing neonatal or myeloproliferative syndrome specimens for Vitamin B12 levels. Performed at [facility], [place] [address], [place], [place] [address]   . Folate 06/13/2017 31.0  >5.9 ng/mL Final   Performed at [facility], [address] [place], [place] [address]    Assessment:  Bailey Flores is a 57 y.o. female with a history of bile duct tumor s/p resection in 06/2012 while living in [place].  She was told that she had a "rare slow growing tumor" that was completely resected.  No pathology is available.  Abdominal ultrasound on 03/06/2017 revealed fatty infiltration of the liver.  She is status post cholecystectomy with no biliary distention.  EGD on 03/05/2017 revealed a normal esophagus.  There was a large amount of food (residue) in the stomach.  There was gastritis. 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  There was no dysplasia or Helicobacter pylori.  Duodenum was normal.  EGD on 03/30/2017 revealed a normal esophagus.  There was gastritis. 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  There was no dysplasia or Helicobacter pylori.  Duodenum was normal.  Bilateral mammogram on 04/02/2017  revealed no mammographic evidence of malignancy.  Last colonoscopy was < 10 years ago.     She has a > 50 pack year smoking history (stopped smoking 2014).  Low dose chest CT on 05/01/2017 was Lung-RADS 1, negative.  Symptomatically, she is doing well.  She has chronic post-operative RUQ pain.  She denies any flushing or diarrhea.  Exam is stable.  Plan: 1.  Discuss no records from prior physician.  Patient to assist. 2.  Discuss interval low dose chest CT - no evidence of malignancy.  Continue yearly imaging. 5.  Consult pulmonary medicine- COPD. Per patient request.  7.  RTC in 3 months for MD assessment and review of outside records and low dose chest  CT.   Lequita Asal, MD  07/17/2017, 7:19 PM

## 2017-07-18 ENCOUNTER — Inpatient Hospital Stay: Payer: TRICARE For Life (TFL) | Admitting: Hematology and Oncology

## 2017-07-18 ENCOUNTER — Ambulatory Visit: Admitting: Hematology and Oncology

## 2017-07-24 NOTE — Progress Notes (Signed)
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  HPI: The patient was last seen in the medical oncology clinic on 04/18/2017.  At that time, she was doing well.  She complained of chronic postoperative RUQ pain.  She denied any changes in bowel habits.  Exam was stable.  CBC and chemistries unremarkable.  Chromogranin A was elevated at 9 (0-5 nmol/L).  Patient was referred to pulmonary medicine secondary to her COPD diagnosis.  She was also referred to the low-dose chest CT cancer screening program with a greater than 50-pack-year smoking history.  Due to her elevated chromogranin A level, she collected a 5-HIAA 24 hour urine.  24 hour quantitative 5-HIAA level 4.9 (0.0-14.9).  Low-dose chest CT on 05/01/2017 demonstrated mild changes related to emphysema and a hyperdense branching filling defect within the peripheral bronchial within the posterior lateral left upper lobe that was felt to be compatible with benign process.  Lung-RADS 1 (negative) study.   She was seen in consult on 05/04/2017 by Dr. [name].  Pulmonology noted significant deconditioning in addition to a COPD diagnosis.  Patient has a known OSAH syndrome diagnosis, however she does not use nocturnal CPAP therapy.  She  was referred to pulmonary rehab and a home sleep study was ordered.  The patient had a follow-up visit on 07/11/2017 with pulmonology.  Home sleep study results demonstrated an AHI of 20, however patient continued to refuse nocturnal PAP therapy.   She began to work with pulmonary rehab therapist on 06/25/2017.  Note from pulmonary rehab therapist Tessie Fass, RRT) indicates that patient may recall on 07/11/2017 indicating that she no longer wished to participate in the [facility] (pulmonary rehab) program.  Symptomatically, she feels "fine".  She notes no problems.  She notes  spinal stenosis and sciatica.  She "got a shot for arthritis and is now dancing".   Past Medical History:  Diagnosis Date  . Arthritis    hands  . Asthma   . Barrett's esophagus   . Cancer [facility])    neuroendocrine ca  . COPD (chronic obstructive pulmonary disease) ([place])   . Hypertension   . Motion sickness    reading in car    Past Surgical History:  Procedure Laterality Date  . ABDOMINAL HYSTERECTOMY    . BOWEL RESECTION  07/19/2012  . CESAREAN SECTION     x3  . ESOPHAGOGASTRODUODENOSCOPY (EGD) WITH PROPOFOL N/A 03/05/2017   Procedure: ESOPHAGOGASTRODUODENOSCOPY (EGD) WITH PROPOFOL;  Surgeon: [name], MD;  Location: [place];  Service: Endoscopy;  Laterality: N/A;  . ESOPHAGOGASTRODUODENOSCOPY (EGD) WITH PROPOFOL N/A 03/30/2017   REPEAT IN 03/2019  . HAND SURGERY Right     Family History  Problem Relation Age of Onset  . Breast cancer Cousin        mat cousin    Social History:  reports that she quit smoking about 5 years ago. Her smoking use included cigarettes. She has a 74.00 pack-year smoking history. she has never used smokeless tobacco. She reports that she drinks alcohol. She reports that she does not use drugs.  She smoked 1 1/2 - 2 packs/day from the 45 (age 2) to 06/2012.  When she was younger, she only smoked 1/2 pack per day.  She is from [place].  She grew up in [place].  She previously lived in [facility], but sought  medical attention in [facility] then [place].  She moved to [place] in 07/2016.  She lives in [place].  The patient is alone today.  Allergies:  Allergies  Allergen Reactions  . Hydrocodone-Acetaminophen Itching    Current Medications: Current Outpatient Medications  Medication Sig Dispense Refill  . albuterol (PROVENTIL HFA;VENTOLIN HFA) 108 (90 Base) MCG/ACT inhaler Inhale into the lungs.    . ARIPiprazole (ABILIFY) 5 MG tablet Take 1 tablet (5 mg total) by mouth daily. 30 tablet 1  .  buPROPion (WELLBUTRIN XL) 300 MG 24 hr tablet Take 1 tablet (300 mg total) by mouth every morning. 30 tablet 1  . Fluticasone-Salmeterol (ADVAIR DISKUS) 250-50 MCG/DOSE AEPB Inhale 1 puff into the lungs 2 (two) times daily.     Marland Kitchen gabapentin (NEURONTIN) 300 MG capsule Take 300 mg 3 (three) times daily by mouth.     Marland Kitchen ibuprofen (ADVIL,MOTRIN) 800 MG tablet Take by mouth.    Marland Kitchen ipratropium-albuterol (DUONEB) 0.5-2.5 (3) MG/3ML SOLN Inhale into the lungs.    . lamoTRIgine (LAMICTAL) 100 MG tablet Take 100 mg by mouth 2 (two) times daily. 1102m qam and 200 mg qpm    . lisdexamfetamine (VYVANSE) 70 MG capsule Take 1 capsule (70 mg total) by mouth daily. 30 capsule 0  . losartan-hydrochlorothiazide (HYZAAR) 50-12.5 MG tablet Take by mouth.    . lubiprostone (AMITIZA) 24 MCG capsule Take 24 mcg by mouth daily with breakfast.     . MAGNESIUM PO Take by mouth daily.    . Melatonin 10 MG TABS Take by mouth.    . montelukast (SINGULAIR) 10 MG tablet Take 10 mg by mouth at bedtime.     . Multiple Vitamins-Minerals (CENTRUM SILVER PO) Take by mouth daily.    .Marland Kitchenomeprazole (PRILOSEC) 40 MG capsule Take by mouth.    . SPIRIVA RESPIMAT 2.5 MCG/ACT AERS Inhale 2 puffs into the lungs at bedtime.    . valACYclovir (VALTREX) 1000 MG tablet Take by mouth.     No current facility-administered medications for this visit.     Review of Systems:  GENERAL:  Feels "fine".  No fevers or sweats.  Weight gain of 1 pound since last visit PERFORMANCE STATUS (ECOG):  1 HEENT:  No visual changes, runny nose, sore throat, mouth sores or tenderness. Lungs: Shortness of breath secondary to COPD (see HPI).  No cough.  No hemoptysis. Cardiac:  No chest pain, palpitations, orthopnea, or PND. Breasts:  No breast concerns.  Mammogram 04/02/2017. GI:  Intermittent nausea.  Constipation unless takes magnesium or Amitiza.  No vomiting, diarrhea, melena or hematochezia.  Last colonoscopy about 9 years ago. GU:  No urgency, frequency,  dysuria, or hematuria. Musculoskeletal:  No back pain.  Arthritis in hands and right shoulder.  No muscle tenderness. Extremities:  No pain or swelling. Skin:  No rashes or skin changes. Neuro:  Some memory issues (word finding).  Spinal stenosis and sciatica.  No headache, numbness or weakness, or coordination issues. Endocrine:  No diabetes, thyroid issues, hot flashes or night sweats. Psych:  No mood changes, depression or anxiety. Pain:  No pain. Review of systems:  All other systems reviewed and found to be negative.  Physical Exam: There were no vitals taken for this visit. GENERAL:  Well developed, well nourished, woman sitting comfortably in the exam room in no acute distress. MENTAL STATUS:  Alert and oriented to person, place and time. HEAD:  Long black slightly graying hair.  Normocephalic, atraumatic, face symmetric, no  Cushingoid features. EYES:  Glasses.  Brown eyes.  No conjunctivitis or scleral icterus. RESPIRATORY:  Clear to auscultation without rales, wheezes or rhonchi. CARDIOVASCULAR:  Regular rate and rhythm without murmur, rub or gallop. ABDOMEN:  Well healed RUQ incision.  Soft, tender to minimal palpation over scar.  No guarding or rebound tenderness.  Active bowel sounds and no hepatosplenomegaly.  No masses. SKIN:  No rashes, ulcers or lesions. EXTREMITIES: No edema, no skin discoloration or tenderness.  No palpable cords. LYMPH NODES: No palpable cervical, supraclavicular, axillary or inguinal adenopathy  NEUROLOGICAL: Unremarkable. PSYCH:  Appropriate.   No visits with results within 3 Day(s) from this visit.  Latest known visit with results is:  Hospital Outpatient Visit on 06/13/2017  Component Date Value Ref Range Status  . Sodium 06/13/2017 140  135 - 145 mmol/L Final  . Potassium 06/13/2017 3.8  3.5 - 5.1 mmol/L Final  . Chloride 06/13/2017 104  101 - 111 mmol/L Final  . CO2 06/13/2017 27  22 - 32 mmol/L Final  . Glucose, Bld 06/13/2017 78  65 - 99  mg/dL Final  . BUN 06/13/2017 26* 6 - 20 mg/dL Final  . Creatinine, Ser 06/13/2017 0.78  0.44 - 1.00 mg/dL Final  . Calcium 06/13/2017 9.3  8.9 - 10.3 mg/dL Final  . Total Protein 06/13/2017 7.3  6.5 - 8.1 g/dL Final  . Albumin 06/13/2017 4.2  3.5 - 5.0 g/dL Final  . AST 06/13/2017 26  15 - 41 U/L Final  . ALT 06/13/2017 26  14 - 54 U/L Final  . Alkaline Phosphatase 06/13/2017 72  38 - 126 U/L Final  . Total Bilirubin 06/13/2017 0.4  0.3 - 1.2 mg/dL Final  . GFR calc non Af Amer 06/13/2017 >60  >60 mL/min Final  . GFR calc Af Amer 06/13/2017 >60  >60 mL/min Final   Comment: (NOTE) The eGFR has been calculated using the CKD EPI equation. This calculation has not been validated in all clinical situations. eGFR's persistently <60 mL/min signify possible Chronic Kidney Disease.   Georgiann Hahn gap 06/13/2017 9  5 - 15 Final   Performed at [facility], [address] [place], [place] [address]  . Cholesterol 06/13/2017 183  0 - 200 mg/dL Final  . Triglycerides 06/13/2017 80  <150 mg/dL Final  . HDL 06/13/2017 92  >40 mg/dL Final  . Total CHOL/HDL Ratio 06/13/2017 2.0  RATIO Final  . VLDL 06/13/2017 16  0 - 40 mg/dL Final  . LDL Cholesterol 06/13/2017 75  0 - 99 mg/dL Final   Comment:        Total Cholesterol/HDL:CHD Risk Coronary Heart Disease Risk Table                     Men   Women  1/2 Average Risk   3.4   3.3  Average Risk       5.0   4.4  2 X Average Risk   9.6   7.1  3 X Average Risk  23.4   11.0        Use the calculated Patient Ratio above and the CHD Risk Table to determine the patient's CHD Risk.        ATP III CLASSIFICATION (LDL):  <100     mg/dL   Optimal  100-129  mg/dL   Near or Above                    Optimal  130-159  mg/dL   Borderline  160-189  mg/dL   High  >190     mg/dL   Very High Performed at [facility], [address] [place], [place] [address]   . WBC 06/13/2017 7.5  3.6 - 11.0 K/uL Final  . RBC 06/13/2017 4.38  3.80 - 5.20  MIL/uL Final  . Hemoglobin 06/13/2017 12.7  12.0 - 16.0 g/dL Final  . HCT 06/13/2017 38.8  35.0 - 47.0 % Final  . MCV 06/13/2017 88.6  80.0 - 100.0 fL Final  . MCH 06/13/2017 29.0  26.0 - 34.0 pg Final  . MCHC 06/13/2017 32.7  32.0 - 36.0 g/dL Final  . RDW 06/13/2017 15.2* 11.5 - 14.5 % Final  . Platelets 06/13/2017 335  150 - 440 K/uL Final  . Neutrophils Relative % 06/13/2017 55  % Final  . Neutro Abs 06/13/2017 4.2  1.4 - 6.5 K/uL Final  . Lymphocytes Relative 06/13/2017 34  % Final  . Lymphs Abs 06/13/2017 2.5  1.0 - 3.6 K/uL Final  . Monocytes Relative 06/13/2017 9  % Final  . Monocytes Absolute 06/13/2017 0.7  0.2 - 0.9 K/uL Final  . Eosinophils Relative 06/13/2017 1  % Final  . Eosinophils Absolute 06/13/2017 0.1  0 - 0.7 K/uL Final  . Basophils Relative 06/13/2017 1  % Final  . Basophils Absolute 06/13/2017 0.0  0 - 0.1 K/uL Final   Performed at [facility], [address] [place], [place] [address]  . Hgb A1c MFr Bld 06/13/2017 5.9* 4.8 - 5.6 % Final   Comment: (NOTE) Pre diabetes:          5.7%-6.4% Diabetes:              >6.4% Glycemic control for   <7.0% adults with diabetes   . Mean Plasma Glucose 06/13/2017 122.63  mg/dL Final   Performed at [facility] [address] [place], [place] [address]  . Prolactin 06/13/2017 4.6* 4.8 - 23.3 ng/mL Final   Comment: (NOTE) Performed At: [facility] [place], [place] [id number] Rush Farmer MD HT:[id number] Performed at [facility], [address] [place], [place] [address]   . TSH 06/13/2017 1.205  0.350 - 4.500 uIU/mL Final   Comment: Performed by a 3rd Generation assay with a functional sensitivity of <=0.01 uIU/mL. Performed at [facility], [address], [place], [place] [address]   . Vit D, 25-Hydroxy 06/13/2017 22.2* 30.0 - 100.0 ng/mL Final   Comment: (NOTE) Vitamin D deficiency has been defined by the [place] practice guideline as a level of serum 25-OH vitamin D less than 20 ng/mL (1,2). The Endocrine Society went on to further define vitamin D insufficiency as a level between 21 and 29 ng/mL (2). 1. IOM (Institute of Medicine). 2010. Dietary reference   intakes for calcium and D. [place]: The   [employer]. 2. Holick MF, Binkley [place], Bischoff-Ferrari HA, et al.   Evaluation, treatment, and prevention of vitamin D   deficiency: an Endocrine Society clinical practice   guideline. JCEM. 2011 Jul; 96(7):1911-30. Performed At: [facility] [place], [place] [id number] Rush Farmer MD MS:[id number] Performed at [facility], [address] [place], [place] [address]   . Vitamin B-12 06/13/2017 1,429* 180 - 914 pg/mL Final   Comment: (NOTE) This assay is not validated for testing neonatal or myeloproliferative syndrome specimens for Vitamin B12 levels. Performed at [facility], [place] [address], [place], [place] [address]   . Folate 06/13/2017 31.0  >5.9 ng/mL Final   Performed at [facility], [address] [place], [place] [address]    Assessment:  Ramon Brant is a 57 y.o. female with a history of bile duct tumor s/p resection in 07/19/2012 in [place], [place].    She underwent excision of the common hepatic duct, common bile duct, right hepatic duct, left hepatic duct, roux en Y hepaticojejunostomy to the right hepatic duct, and hepaticojejunostomy to the left hepatic duct.  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  Proximal and distal margins were negative.  Lymph nodes were negative.  MRCP on 04/23/2012 in [place], [place], revealed features of choledocholithiasis with a 1.2 cm ovoid filling defect within the mid segment of the CBD with associated intrahepatic and proximal extrahepatic biliary  ductal dilatation.  Abdomen and pelvic CT on 09/02/2012 revealed post-operative changes in the gallbladder fossa.  There was mildly distended pancreatic duct within the head of the pancreas without mass or adenopathy.  Abdominal ultrasound on 03/06/2017 revealed fatty infiltration of the liver.  She is status post cholecystectomy with no biliary distention.  Chromagranin A was 9 (0-5) on 04/18/2017.  24 hour quantitative 5-HIAA level was 4.9 (0.0-14.9) on 04/24/2017.  EGD on 03/05/2017 revealed a normal esophagus.  There was a large amount of food (residue) in the stomach.  There was gastritis. 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  There was no dysplasia or Helicobacter pylori.  Duodenum was normal.  EGD on 03/30/2017 revealed a normal esophagus.  There was gastritis. 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  There was no dysplasia or Helicobacter pylori.  Duodenum was normal.  Bilateral mammogram on 04/02/2017  revealed no mammographic evidence of malignancy.  Last colonoscopy was < 10 years ago.     She has a > 50 pack year smoking history (stopped smoking 2014).   Low-dose chest CT on 05/01/2018 was Lung-RADS 1 (negative).    Symptomatically, she is doing well.  She denies any flushing or diarrhea.  Exam is stable.  Plan: 1.  Discuss review of records from [place].  Low grade neuroendocrine carcinoma. 2.  Discuss interval low dose chest CT- benign.  Recommend annual imaging. 3.  Discuss NCCN guidelines for follow-up pancreatic neuroendocrine tumor- follow-up every 6-12 months.  Consideration for imaging (chest CT and abdomen CT with contrast) and biochemical markers as clinically indicated. 4.  RTC in 6 months for MD assessment and labs (CBC with diff, CMP, chomogranin A).   Nolon Stalls, MD 07/25/2017, 4:35 PM

## 2017-07-25 ENCOUNTER — Inpatient Hospital Stay: Attending: Hematology and Oncology | Admitting: Hematology and Oncology

## 2017-07-25 ENCOUNTER — Encounter: Payer: Self-pay | Admitting: Hematology and Oncology

## 2017-07-25 ENCOUNTER — Ambulatory Visit (INDEPENDENT_AMBULATORY_CARE_PROVIDER_SITE_OTHER): Admitting: Licensed Clinical Social Worker

## 2017-07-25 VITALS — BP 137/91 | HR 89 | Temp 97.9°F | Resp 20 | Wt 129.4 lb

## 2017-07-25 DIAGNOSIS — Z79899 Other long term (current) drug therapy: Secondary | ICD-10-CM | POA: Insufficient documentation

## 2017-07-25 DIAGNOSIS — Z885 Allergy status to narcotic agent status: Secondary | ICD-10-CM | POA: Diagnosis not present

## 2017-07-25 DIAGNOSIS — M4800 Spinal stenosis, site unspecified: Secondary | ICD-10-CM | POA: Insufficient documentation

## 2017-07-25 DIAGNOSIS — Z87891 Personal history of nicotine dependence: Secondary | ICD-10-CM | POA: Diagnosis not present

## 2017-07-25 DIAGNOSIS — M1990 Unspecified osteoarthritis, unspecified site: Secondary | ICD-10-CM | POA: Diagnosis not present

## 2017-07-25 DIAGNOSIS — I10 Essential (primary) hypertension: Secondary | ICD-10-CM | POA: Diagnosis not present

## 2017-07-25 DIAGNOSIS — D3A8 Other benign neuroendocrine tumors: Secondary | ICD-10-CM

## 2017-07-25 DIAGNOSIS — C7A8 Other malignant neuroendocrine tumors: Secondary | ICD-10-CM

## 2017-07-25 DIAGNOSIS — F3130 Bipolar disorder, current episode depressed, mild or moderate severity, unspecified: Secondary | ICD-10-CM | POA: Diagnosis not present

## 2017-07-25 DIAGNOSIS — J449 Chronic obstructive pulmonary disease, unspecified: Secondary | ICD-10-CM | POA: Diagnosis not present

## 2017-07-25 NOTE — Progress Notes (Signed)
Patient offers no complaints today. 

## 2017-07-25 NOTE — Progress Notes (Signed)
  THERAPIST PROGRESS NOTE   Date of Service:   07/25/17  Session Time:  1 hour  Patient:   Bailey Flores   DOB:   1960-11-18  MR Number:  [id number]  Location:  [facility] [address] [place] [address] Dept: [phone]            Provider/Observer:  Lubertha South Counselor  Risk of Suicide/Violence: virtually non-existent   Diagnosis:    Bipolar I disorder, most recent episode depressed ([place])  Type of Therapy: Individual Therapy  Treatment Goals addressed: Coping and Diagnosis: Bipolar  Participation Level: Active  Interventions: CBT and Motivational Interviewing  Behavioral Response: CasualAlertPessimistic   Summary: Therapist actively listened as Patient reported her mood, discussed stressors and her triggers.  Reviewed coping skills that she has attempted to use and discussed efficacy. Provided support for Patient as she discussed her current mood. Stressed the importance of mood stabilization through learned coping skills and medication management.  Encouraged Patient to focus on her strengths and the positive aspects.     Plan: [name] will continue to use coping skills and effective communication skills   Return again in 2 weeks.

## 2017-07-26 ENCOUNTER — Encounter (INDEPENDENT_AMBULATORY_CARE_PROVIDER_SITE_OTHER): Payer: Self-pay

## 2017-07-26 ENCOUNTER — Ambulatory Visit
Admission: RE | Admit: 2017-07-26 | Discharge: 2017-07-26 | Disposition: A | Discharge status: Home / Self Care | Payer: Self-pay | Source: Ambulatory Visit | Attending: Hematology and Oncology | Admitting: Hematology and Oncology

## 2017-07-26 ENCOUNTER — Inpatient Hospital Stay
Admission: RE | Admit: 2017-07-26 | Discharge: 2017-07-26 | Disposition: A | Discharge status: Home / Self Care | Payer: Self-pay | Source: Ambulatory Visit | Attending: Hematology and Oncology | Admitting: Hematology and Oncology

## 2017-07-26 ENCOUNTER — Other Ambulatory Visit: Payer: Self-pay | Admitting: Hematology and Oncology

## 2017-07-26 ENCOUNTER — Other Ambulatory Visit: Payer: Self-pay | Admitting: Internal Medicine

## 2017-07-26 DIAGNOSIS — R52 Pain, unspecified: Secondary | ICD-10-CM

## 2017-07-26 MED ORDER — FLUTICASONE-SALMETEROL 250-50 MCG/DOSE IN AEPB: 1.0000 | INHALATION_SPRAY | Freq: Two times a day (BID) | RESPIRATORY_TRACT | 1 refills | Status: DC

## 2017-07-26 NOTE — Telephone Encounter (Signed)
Per patient email request patient requesting refill of Advair.

## 2017-08-02 ENCOUNTER — Ambulatory Visit

## 2017-08-08 NOTE — Progress Notes (Signed)
  THERAPIST PROGRESS NOTE   Date of Service:   07/03/2017  Session Time:  1 hour  Patient:   Bailey Flores   DOB:   [birth year]  MR Number:  [id number]  Location:  [facility] [address] [place] [address] Dept: [phone]            Provider/Observer:  Lubertha South Counselor  Risk of Suicide/Violence: virtually non-existent   Diagnosis:    Bipolar I disorder, most recent episode depressed ([place])  Type of Therapy: Individual Therapy  Treatment Goals addressed: Coping  Participation Level: Active   Interventions: Solution Focused   Behavioral Response: CasualAlertAnxious   Summary: Therapist met with Patient in an initial therapy session to assess current mood and to build rapport. Therapist engaged Patient in discussion about her life and what is going well for her. Therapist provided support for Patient as she shared details about her life, her current stressors, mood, coping skills, her past, and her children. Therapist prompted Patient to discuss her support system and ways that she manages her daily stress, anger, and frustrations.  LCSW discussed what psychotherapy is and is not and the importance of the therapeutic relationship to include open and honest communication between client and therapist and building trust.  Reviewed advantages and disadvantages of the therapeutic process and limitations to the therapeutic relationship including LCSW's role in maintaining the safety of the client, others and those in client's care.    Plan: Bailey Flores will continue to attend therapy   Return again in 2 weeks.

## 2017-08-10 ENCOUNTER — Ambulatory Visit (INDEPENDENT_AMBULATORY_CARE_PROVIDER_SITE_OTHER): Admitting: Psychiatry

## 2017-08-10 ENCOUNTER — Encounter: Payer: Self-pay | Admitting: Psychiatry

## 2017-08-10 VITALS — BP 148/93 | HR 79 | Ht 60.0 in | Wt 128.8 lb

## 2017-08-10 DIAGNOSIS — F3131 Bipolar disorder, current episode depressed, mild: Secondary | ICD-10-CM

## 2017-08-10 DIAGNOSIS — F900 Attention-deficit hyperactivity disorder, predominantly inattentive type: Secondary | ICD-10-CM

## 2017-08-10 MED ORDER — LISDEXAMFETAMINE DIMESYLATE 70 MG PO CAPS: 70.0000 mg | ORAL_CAPSULE | Freq: Every day | ORAL | 0 refills | Status: DC

## 2017-08-10 MED ORDER — BUPROPION HCL ER (SR) 200 MG PO TB12: 200.0000 mg | ORAL_TABLET | Freq: Two times a day (BID) | ORAL | 0 refills | Status: DC

## 2017-08-10 MED ORDER — ARIPIPRAZOLE 5 MG PO TABS: 5.0000 mg | ORAL_TABLET | Freq: Every day | ORAL | 0 refills | Status: DC

## 2017-08-10 MED ORDER — LAMOTRIGINE 100 MG PO TABS: 100.0000 mg | ORAL_TABLET | Freq: Every day | ORAL | 0 refills | Status: DC

## 2017-08-10 NOTE — Progress Notes (Signed)
Jeff [facility] MD OP Progress Note  08/10/2017 12:31 PM Bailey Flores  MRN:  [id number]  Chief Complaint: ' I am ok." Chief Complaint    Follow-up     HPI: Bailey Flores is a 57 year old female who is married, unemployed, has a history of bipolar disorder, ADHD, cognitive disorder unspecified, COPD, hypertension, presented to the clinic today for a follow-up visit.  Patient today reports she took the Wellbutrin  300 mg extended release.  She reports she did not tolerate it well.  She started having severe irritability and mood symptoms.  Hence went back to the Wellbutrin 200 mg twice a day.  She reports she is tolerating the other medications well.  She is currently on Vyvanse 70 mg which was restarted.  She does have ADHD symptoms.  She denies any side effects to the medication.  She reports sleep is good.  She denies any perceptual disturbances.  She reports she and her husband moved into a new home.  She reports they still have all the boxes which they need to unpack.  She reports her son also moved in with them.  He does not stay home all the time but does come home on weekends.  Reports he has been helping out financially and that has been very helpful.  She reports she is coping with all the recent stressors very well.  She does worry about financial situation on and off.  She however is counting her blessings.  She has started following up with Ms. Peacock on a regular basis.  She reports she has several appointments scheduled so that she can meet her more frequently. Visit Diagnosis:    ICD-10-CM   1. Bipolar disorder, current episode depressed, mild (HCC) F31.31 ARIPiprazole (ABILIFY) 5 MG tablet    lamoTRIgine (LAMICTAL) 100 MG tablet    buPROPion (WELLBUTRIN SR) 200 MG 12 hr tablet  2. Attention deficit hyperactivity disorder (ADHD), predominantly inattentive type F90.0 lisdexamfetamine (VYVANSE) 70 MG capsule    DISCONTINUED: lisdexamfetamine (VYVANSE) 70 MG capsule    Past Psychiatric  History: Hx of bipolar disorder-diagnosed several years ago.  She reports inpatient mental health admission in [place] x1 in the past.  She reports at least 2  suicide attempts in the past.  She reports previous trials of medications like Prozac, Abilify.  She reports Prozac was tried 2 months ago or so and it made her more anxious.  Past Medical History:  Past Medical History:  Diagnosis Date  . Arthritis    hands  . Asthma   . Barrett's esophagus   . Cancer [facility])    neuroendocrine ca  . COPD (chronic obstructive pulmonary disease) ([facility])   . Hypertension   . Motion sickness    reading in car  . 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 ABDOMINAL HYSTERECTOMY    . BOWEL RESECTION  07/19/2012  . CESAREAN SECTION     x3  . ESOPHAGOGASTRODUODENOSCOPY (EGD) WITH PROPOFOL N/A 03/05/2017   Procedure: ESOPHAGOGASTRODUODENOSCOPY (EGD) WITH PROPOFOL;  Surgeon: [name], MD;  Location: [facility];  Service: Endoscopy;  Laterality: N/A;  . ESOPHAGOGASTRODUODENOSCOPY (EGD) WITH PROPOFOL N/A 03/30/2017   REPEAT IN 03/2019  . HAND SURGERY Right     Family Psychiatric History: She has twin children-both adults-have mental illness.  Her daughter who is 1 of the twins has bipolar disorder.  Family History:  Family History  Problem Relation Age of Onset  . Breast cancer Cousin  mat cousin  Substance abuse history: Denies   Social History: Born in [place].  She was raised in [place].  She lives in [place] with her ex-husband in [place] in the past.  She got divorced.  She is currently married to her current husband, they have been together since the past 15 years or so.  She has 4 children, all adults.  Her oldest daughter is in [place], she was from previous relationship.  She is doing well.  Her twin children currently lives with her in the same house.  Her husband recently lost his job as an Loss adjuster, chart[employer]. Social History   Socioeconomic History  . Marital status: Married    Spouse name: Not on file  . Number of children: Not on file  . Years of education: Not on file  . Highest education level: Not on file  Occupational History  . Not on file  Social Needs  . Financial resource strain: Not on file  . Food insecurity:    Worry: Not on file    Inability: Not on file  . Transportation needs:    Medical: Not on file    Non-medical: Not on file  Tobacco Use  . Smoking status: Former Smoker    Packs/day: 2.00    Years: 37.00    Pack years: 74.00    Types: Cigarettes    Last attempt to quit: 07/19/2012    Years since quitting: 5.0  . Smokeless tobacco: Never Used  Substance and Sexual Activity  . Alcohol use: Yes    Comment: 2 drinks/mo  . Drug use: No  . Sexual activity: Yes  Lifestyle  . Physical activity:    Days per week: Not on file    Minutes per session: Not on file  . Stress: Not on file  Relationships  . Social connections:    Talks on phone: Not on file    Gets together: Not on file    Attends religious service: Not on file    Active member of club or organization: Not on file    Attends meetings of clubs or organizations: Not on file    Relationship status: Not on file  Other Topics Concern  . Not on file  Social History Narrative  . Not on file    Allergies:  Allergies  Allergen Reactions  . Hydrocodone-Acetaminophen Itching    Metabolic Disorder Labs: Lab Results  Component Value Date   HGBA1C 5.9 (H) 06/13/2017   MPG 122.63 06/13/2017   Lab Results  Component Value Date   PROLACTIN 4.6 (L) 06/13/2017   Lab Results  Component Value Date   CHOL 183 06/13/2017   TRIG 80 06/13/2017   HDL 92 06/13/2017   CHOLHDL 2.0 06/13/2017   VLDL 16 06/13/2017   LDLCALC 75 06/13/2017   Lab Results  Component Value Date   TSH 1.205 06/13/2017    Therapeutic Level Labs: No results found for: LITHIUM No results found for: VALPROATE No  components found for:  CBMZ  Current Medications: Current Outpatient Medications  Medication Sig Dispense Refill  . albuterol (PROVENTIL HFA;VENTOLIN HFA) 108 (90 Base) MCG/ACT inhaler Inhale into the lungs.    . ARIPiprazole (ABILIFY) 5 MG tablet Take 1 tablet (5 mg total) by mouth daily. 90 tablet 0  . buPROPion (WELLBUTRIN SR) 200 MG 12 hr tablet Take 1 tablet (200 mg total) by mouth 2 (two) times daily. 180 tablet 0  . Fluticasone-Salmeterol (ADVAIR DISKUS) 250-50 MCG/DOSE AEPB Inhale 1 puff  into the lungs 2 (two) times daily. 180 each 1  . gabapentin (NEURONTIN) 300 MG capsule Take 300 mg 3 (three) times daily by mouth.     Marland Kitchen ibuprofen (ADVIL,MOTRIN) 800 MG tablet Take by mouth.    Marland Kitchen ipratropium-albuterol (DUONEB) 0.5-2.5 (3) MG/3ML SOLN Inhale into the lungs.    . lamoTRIgine (LAMICTAL) 100 MG tablet Take 1-2 tablets (100-200 mg total) by mouth daily. 100mg  qam and 200 mg qpm 270 tablet 0  . lisdexamfetamine (VYVANSE) 70 MG capsule Take 1 capsule (70 mg total) by mouth daily. 30 capsule 0  . losartan-hydrochlorothiazide (HYZAAR) 50-12.5 MG tablet Take by mouth.    . lubiprostone (AMITIZA) 24 MCG capsule Take 24 mcg by mouth daily with breakfast.     . MAGNESIUM PO Take by mouth daily.    . Melatonin 10 MG TABS Take by mouth.    . montelukast (SINGULAIR) 10 MG tablet Take 10 mg by mouth at bedtime.     . Multiple Vitamins-Minerals (CENTRUM SILVER PO) Take by mouth daily.    Marland Kitchen omeprazole (PRILOSEC) 40 MG capsule Take by mouth.    . SPIRIVA RESPIMAT 2.5 MCG/ACT AERS Inhale 2 puffs into the lungs at bedtime.    . valACYclovir (VALTREX) 1000 MG tablet Take by mouth.     No current facility-administered medications for this visit.      Musculoskeletal: Strength & Muscle Tone: within normal limits Gait & Station: normal Patient leans: N/A  Psychiatric Specialty Exam: Review of Systems  Psychiatric/Behavioral: Positive for depression. The patient is nervous/anxious.   All other  systems reviewed and are negative.   Blood pressure (!) 148/93, pulse 79, height 5' (1.524 m), weight 128 lb 12.8 oz (58.4 kg).Body mass index is 25.15 kg/m.  General Appearance: Casual  Eye Contact:  Fair  Speech:  Clear and Coherent  Volume:  Normal  Mood:  Anxious and Dysphoric improving  Affect:  Congruent  Thought Process:  Goal Directed and Descriptions of Associations: Intact  Orientation:  Full (Time, Place, and Person)  Thought Content: Logical   Suicidal Thoughts:  No  Homicidal Thoughts:  No  Memory:  Immediate;   Fair Recent;   Fair Remote;   Fair  Judgement:  Fair  Insight:  Fair  Psychomotor Activity:  Normal  Concentration:  Concentration: Fair and Attention Span: Fair  Recall:  [employer] of Knowledge: Fair  Language: Fair  Akathisia:  No  Handed:  Right  AIMS (if indicated): 0  Assets:  Communication Skills Desire for Improvement Social Support  ADL's:  Intact  Cognition: WNL  Sleep:  Fair   Screenings: PHQ2-9     Pulmonary Rehab from 06/25/2017 in [facility]  PHQ-2 Total Score  4  PHQ-9 Total Score  19       Assessment and Plan: Bailey Flores is a 57 year old female who has a history of bipolar disorder, ADHD, cognitive disorder, COPD, GERD, presented to the clinic today for a follow-up visit. Pt continues to have psychosocial stressors of her husband losing his job, financial stressors, her own mental health issues as well as mental health issues of her children.  She however continues to have a good social network around her.  Her husband and her children are extremely supportive.  She also started psychotherapy appointments with Ms. Peacock here in clinic.  She is currently tolerating her current medications well.  Discussed plan as noted below.  Plan Bipolar disorder Continue Lamictal 300 mg p.o. daily in divided  doses Continue Wellbutrin SR 200 mg twice a day.  She reports she went up on the dose since she did not tolerate the XL  300 mg well. Continue Abilify 5 mg p.o. nightly Provided medication education, provided handouts AIMS =0  Anxiety symptoms Continue CBT with Ms. Peacock Continue hydroxyzine 25 mg p.o. as needed  For insomnia Continue melatonin  ADHD Reviewed medical records from her previous provider Restart Vyvanse 70 mg p.o. daily Reviewed [place] controlled substance database Provided 2 scripts with date specified  History of cognitive disorder Unclear what kind of cognitive issues she has had in the past.  She however is alert, oriented, and able to give personal information and details about medications without problems. We will continue to monitor.  Follow-up in clinic in 2 months or sooner if needed.  More than 50 % of the time was spent for psychoeducation and supportive psychotherapy and care coordination.  This note was generated in part or whole with voice recognition software. Voice recognition is usually quite accurate but there are transcription errors that can and very often do occur. I apologize for any typographical errors that were not detected and corrected.          [name], MD 08/10/2017, 12:31 PM

## 2017-08-16 ENCOUNTER — Other Ambulatory Visit: Payer: Self-pay | Admitting: Orthopedic Surgery

## 2017-08-16 DIAGNOSIS — M545 Low back pain: Secondary | ICD-10-CM

## 2017-08-21 ENCOUNTER — Ambulatory Visit (INDEPENDENT_AMBULATORY_CARE_PROVIDER_SITE_OTHER): Admitting: Licensed Clinical Social Worker

## 2017-08-21 DIAGNOSIS — F3131 Bipolar disorder, current episode depressed, mild: Secondary | ICD-10-CM

## 2017-08-24 ENCOUNTER — Ambulatory Visit
Admission: RE | Admit: 2017-08-24 | Discharge: 2017-08-24 | Disposition: A | Discharge status: Home / Self Care | Source: Ambulatory Visit | Attending: Orthopedic Surgery | Admitting: Orthopedic Surgery

## 2017-08-24 DIAGNOSIS — M545 Low back pain: Secondary | ICD-10-CM | POA: Diagnosis present

## 2017-08-24 DIAGNOSIS — M4807 Spinal stenosis, lumbosacral region: Secondary | ICD-10-CM | POA: Diagnosis not present

## 2017-08-24 DIAGNOSIS — M48061 Spinal stenosis, lumbar region without neurogenic claudication: Secondary | ICD-10-CM | POA: Diagnosis not present

## 2017-08-24 DIAGNOSIS — M4316 Spondylolisthesis, lumbar region: Secondary | ICD-10-CM | POA: Diagnosis not present

## 2017-08-24 DIAGNOSIS — M5136 Other intervertebral disc degeneration, lumbar region: Secondary | ICD-10-CM | POA: Insufficient documentation

## 2017-08-24 DIAGNOSIS — M5126 Other intervertebral disc displacement, lumbar region: Secondary | ICD-10-CM | POA: Diagnosis not present

## 2017-09-04 ENCOUNTER — Ambulatory Visit (INDEPENDENT_AMBULATORY_CARE_PROVIDER_SITE_OTHER): Admitting: Licensed Clinical Social Worker

## 2017-09-04 DIAGNOSIS — F3131 Bipolar disorder, current episode depressed, mild: Secondary | ICD-10-CM | POA: Diagnosis not present

## 2017-09-06 ENCOUNTER — Ambulatory Visit: Attending: Internal Medicine

## 2017-09-06 ENCOUNTER — Ambulatory Visit: Payer: Self-pay

## 2017-09-06 DIAGNOSIS — J449 Chronic obstructive pulmonary disease, unspecified: Secondary | ICD-10-CM | POA: Insufficient documentation

## 2017-09-06 MED ORDER — ALBUTEROL SULFATE (2.5 MG/3ML) 0.083% IN NEBU
2.5000 mg | INHALATION_SOLUTION | Freq: Once | RESPIRATORY_TRACT | Status: AC
  Administered 2017-09-06: 2.5 mg via RESPIRATORY_TRACT
  Filled 2017-09-06: qty 3

## 2017-09-12 NOTE — Progress Notes (Signed)
  THERAPIST PROGRESS NOTE   Date of Service:   08/21/2017  Session Time:   1 hour  Patient:   Tateanna Bach   DOB:   [birth year]  MR Number:  [id number]  Location:  [facility] [name] [place] [address] Dept: [phone]            Provider/Observer:  Lubertha South Counselor  Risk of Suicide/Violence: virtually non-existent   Diagnosis:    No diagnosis found.  Type of Therapy: Individual Therapy  Treatment Goals addressed: Coping  Participation Level: Active    Behavioral Observation: Jamyria Ozanich  presents as a 57 y.o.-year-old  Interventions: Supportive   Behavioral Response: CasualAlertEuthymic   Summary: Patient reports being overwhelmed due to her inability to organize the home.  Explored various ways to encourage self to begin the process.  Encouraged Patient to begin with 15 minutes to empty boxes and decided to keep or trash each item.  Discussed her thoughts and feelings about throwing away some items that she is not using.  Discussion of current daily schedule and being able to put forth effort into the 15 minutes.   Plan: Olga Bourbeau will continue to use coping skills to reduce symptoms   Return again in 2 weeks.

## 2017-09-13 ENCOUNTER — Telehealth: Payer: Self-pay | Admitting: Internal Medicine

## 2017-09-13 ENCOUNTER — Telehealth: Payer: Self-pay

## 2017-09-13 DIAGNOSIS — F900 Attention-deficit hyperactivity disorder, predominantly inattentive type: Secondary | ICD-10-CM

## 2017-09-13 NOTE — Telephone Encounter (Signed)
Patient calling to see if she needs an ov to review pft results or if she could do this over the phone.

## 2017-09-13 NOTE — Telephone Encounter (Signed)
pt states she needs a refill on her vyvanse.  she will not have enough medication to due until she see you on 10-10-17  lisdexamfetamine (VYVANSE) 70 MG capsule 30 capsule 0 08/10/2017    Sig - Route: Take 1 capsule (70 mg total) by mouth daily. - Oral   Sent to pharmacy as: lisdexamfetamine (VYVANSE) 70 MG capsule   Earliest Fill Date: 08/10/2017   Notes to Pharmacy: To be filled on or after 09/13/2017   E-Prescribing Status: Receipt confirmed by pharmacy (08/10/2017 11:57 AM EDT)

## 2017-09-13 NOTE — Telephone Encounter (Signed)
Pt wants you to call her to discuss the PFT results since she is to f/u in 6 months and PFT has been done.

## 2017-09-14 MED ORDER — LISDEXAMFETAMINE DIMESYLATE 70 MG PO CAPS: 70.0000 mg | ORAL_CAPSULE | Freq: Every day | ORAL | 0 refills | Status: DC

## 2017-09-14 NOTE — Telephone Encounter (Signed)
Pt informed and states she stopped smoking 5 years ago. Nothing further needed.

## 2017-09-14 NOTE — Telephone Encounter (Signed)
Called patient back about her Vyvanse prescription.  Patient was given a second prescription dated to be filled after 09/13/2017 last month.  [facility] reports [facility] only fill one prescription at a time and that second prescription needs to be resent to them.  I have sent out another prescription today for Vyvanse, #. 30.

## 2017-09-14 NOTE — Telephone Encounter (Signed)
PFT showed moderate COPD/emphysema, recommendation is stop smoking. She can follow up with [place] as needed if she likes.

## 2017-09-18 ENCOUNTER — Ambulatory Visit (INDEPENDENT_AMBULATORY_CARE_PROVIDER_SITE_OTHER): Admitting: Licensed Clinical Social Worker

## 2017-09-18 DIAGNOSIS — F3131 Bipolar disorder, current episode depressed, mild: Secondary | ICD-10-CM

## 2017-09-18 DIAGNOSIS — F900 Attention-deficit hyperactivity disorder, predominantly inattentive type: Secondary | ICD-10-CM

## 2017-09-24 NOTE — Progress Notes (Signed)
   THERAPIST PROGRESS NOTE  Session Time: 71min  Participation Level: Active  Behavioral Response: CasualAlertEuthymic  Type of Therapy: Individual Therapy  Treatment Goals addressed: Coping  Interventions: CBT and Motivational Interviewing  Summary: Bailey Flores is a 57 y.o. female who presents with continued symptoms of her diagnosis.  Explored with Patient her current mood and stressors.  Assisted patient with organizing her thoughts.  Processed with Patient how to compartmentalize her concerns with her home and her relationship.    Patient reports that she has been mostly happy but gets frustrated when she realizes that she has not completed a task for the day.  Patient reports that she has a lot of clutter in her home and wants to decrease and learn how to manage.  Patient jumped from subject to subject.   Patient was able to group and list her concerns to create solutions for each.  Suicidal/Homicidal: No  Plan: Return again in 2 weeks.  Diagnosis: Axis I: Bipolar, mixed    Axis II: No diagnosis    Lubertha South, LCSW 09/04/2017

## 2017-10-02 ENCOUNTER — Ambulatory Visit (INDEPENDENT_AMBULATORY_CARE_PROVIDER_SITE_OTHER): Admitting: Licensed Clinical Social Worker

## 2017-10-02 DIAGNOSIS — F3131 Bipolar disorder, current episode depressed, mild: Secondary | ICD-10-CM | POA: Diagnosis not present

## 2017-10-03 NOTE — Progress Notes (Signed)
   THERAPIST PROGRESS NOTE  Session Time: 17min  Participation Level: Active  Behavioral Response: CasualAlertEuthymic  Type of Therapy: Individual Therapy  Treatment Goals addressed: Coping  Interventions: CBT and Motivational Interviewing  Summary: Bailey Flores is a 57 y.o. female who presents with continued symptoms of her diagnosis.  Therapist re-educated, explained and assisted the Patient on symptoms of her mental illness to assist her with recognizing and understanding her problematic behaviors.  Therapist requested Patient verbalize her struggles with inappropriate reactions toward others and assisted her with improving these interactions by engaging her in an activity working on continuing to develop social skills and learn age appropriate interpersonal relationship skills. Therapist reinforced Patient's behavior by praising her for doing a good job working on Education administrator and applying them to her day to day interactions.    Suicidal/Homicidal: No  Plan: Return again in 2 weeks.  Diagnosis: Axis I: Bipolar, Depressed    Axis II: No diagnosis    Lubertha South, LCSW 10/03/2017

## 2017-10-09 ENCOUNTER — Other Ambulatory Visit: Payer: Self-pay

## 2017-10-09 ENCOUNTER — Ambulatory Visit (INDEPENDENT_AMBULATORY_CARE_PROVIDER_SITE_OTHER): Admitting: Psychiatry

## 2017-10-09 ENCOUNTER — Encounter: Payer: Self-pay | Admitting: Psychiatry

## 2017-10-09 VITALS — BP 134/90 | HR 95 | Temp 97.6°F | Wt 129.6 lb

## 2017-10-09 DIAGNOSIS — F900 Attention-deficit hyperactivity disorder, predominantly inattentive type: Secondary | ICD-10-CM | POA: Diagnosis not present

## 2017-10-09 DIAGNOSIS — F3131 Bipolar disorder, current episode depressed, mild: Secondary | ICD-10-CM | POA: Diagnosis not present

## 2017-10-09 MED ORDER — LAMOTRIGINE 100 MG PO TABS: 100.0000 mg | ORAL_TABLET | Freq: Every day | ORAL | 0 refills | Status: DC

## 2017-10-09 MED ORDER — BUPROPION HCL ER (SR) 200 MG PO TB12: 200.0000 mg | ORAL_TABLET | Freq: Two times a day (BID) | ORAL | 0 refills | Status: DC

## 2017-10-09 MED ORDER — ARIPIPRAZOLE 5 MG PO TABS: 5.0000 mg | ORAL_TABLET | Freq: Every day | ORAL | 0 refills | Status: DC

## 2017-10-09 NOTE — Progress Notes (Signed)
THERAPIST PROGRESS NOTE  Session Time: 71mn  Participation Level: Active  Behavioral Response: CasualAlertEuthymic  Type of Therapy: Individual Therapy  Treatment Goals addressed: Anxiety  Interventions: CBT and Motivational Interviewing  Summary: Bailey Kerwinis a 57y.o. female who presents with continued symptoms of diagnosis.  Clinician met with Patient and worked on an activity called automatic behaviors and their impact. Clinician explained to Patient in great detail that we often do things without being aware that we are doing them. Sometimes our minds can be like busy bees, going from thought to thought. Clinician presented Patient with an example such as when you eat, you might also watch television, talk to others, listen to music, or read.   Suicidal/Homicidal: No  Plan: Return again in 2 weeks.  Diagnosis: Axis I: ADHD, inattentive type and Bipolar, Depressed    Axis II: No diagnosis    NLubertha South LCSW 09/18/2017

## 2017-10-09 NOTE — Progress Notes (Signed)
[facility] MD OP Progress Note  10/09/2017 1:29 PM Bailey Flores  MRN:  [id number]  Chief Complaint: ' I am here for follow up.' Chief Complaint    Follow-up; Medication Refill     HPI: Bailey Flores is a 57 year old female who is married, unemployed, has a history of bipolar disorder, ADHD, cognitive disorder, COPD, hypertension, chronic pain, presented to the clinic today for a follow-up visit.    She today reports she is compliant on her medications as prescribed.  She denies any significant side effects.  She does report the Abilify is making her possibly have higher appetite.  However she has not gained any weight since the past 3 months.  Discussed this with her.  Discussed with her to watch her diet and exercise.  Patient reports back pain issues.  She reports she recently had epidural injection as well as prednisone treatment.  She continues to struggle with pain.  She reports sleep is better however the pain does make it restless on and off.  She continues to follow-up with her pain providers for the same.  Patient continues to be compliant on Vyvanse.  Discussed with patient to reach out to her insurance to see if they will agree with 90-day supply.  She reports she will let writer know once she talks to them.  Patient denies any side effects to the Vyvanse.  She reports her attention and concentration is good.  Patient continues to have good support system from her family. Visit Diagnosis:    ICD-10-CM   1. Bipolar disorder, current episode depressed, mild (HCC) F31.31 ARIPiprazole (ABILIFY) 5 MG tablet    buPROPion (WELLBUTRIN SR) 200 MG 12 hr tablet    lamoTRIgine (LAMICTAL) 100 MG tablet  2. Attention deficit hyperactivity disorder (ADHD), predominantly inattentive type F90.0     Past Psychiatric History: I have reviewed past psychiatric history from my progress note on 08/10/2017.  Past Medical History:  Past Medical History:  Diagnosis Date  . Arthritis    hands  . Asthma   .  Barrett's esophagus   . Cancer [facility])    neuroendocrine ca  . COPD (chronic obstructive pulmonary disease) ([place])   . Hypertension   . Motion sickness    reading in car  . 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 ABDOMINAL HYSTERECTOMY    . BOWEL RESECTION  07/19/2012  . CESAREAN SECTION     x3  . ESOPHAGOGASTRODUODENOSCOPY (EGD) WITH PROPOFOL N/A 03/05/2017   Procedure: ESOPHAGOGASTRODUODENOSCOPY (EGD) WITH PROPOFOL;  Surgeon: [name], MD;  Location: [facility];  Service: Endoscopy;  Laterality: N/A;  . ESOPHAGOGASTRODUODENOSCOPY (EGD) WITH PROPOFOL N/A 03/30/2017   REPEAT IN 03/2019  . HAND SURGERY Right     Family Psychiatric History: Reviewed family psychiatric history from my progress note on 08/10/2017  Family History:  Family History  Problem Relation Age of Onset  . Breast cancer Cousin        mat cousin   Substance abuse history: Denies  Social History: Reviewed social history from my progress note on 08/10/2017. Social History   Socioeconomic History  . Marital status: Married    Spouse name: Not on file  . Number of children: Not on file  . Years of education: Not on file  . Highest education level: Not on file  Occupational History  . Not on file  Social Needs  . Financial resource strain: Not on file  . Food insecurity:  Worry: Not on file    Inability: Not on file  . Transportation needs:    Medical: Not on file    Non-medical: Not on file  Tobacco Use  . Smoking status: Former Smoker    Packs/day: 2.00    Years: 37.00    Pack years: 74.00    Types: Cigarettes    Last attempt to quit: 07/19/2012    Years since quitting: 5.2  . Smokeless tobacco: Never Used  Substance and Sexual Activity  . Alcohol use: Yes    Comment: 2 drinks/mo  . Drug use: No  . Sexual activity: Yes  Lifestyle  . Physical activity:    Days per week: Not on file    Minutes per session: Not on file  .  Stress: Not on file  Relationships  . Social connections:    Talks on phone: Not on file    Gets together: Not on file    Attends religious service: Not on file    Active member of club or organization: Not on file    Attends meetings of clubs or organizations: Not on file    Relationship status: Not on file  Other Topics Concern  . Not on file  Social History Narrative  . Not on file    Allergies:  Allergies  Allergen Reactions  . Hydrocodone-Acetaminophen Itching    Metabolic Disorder Labs: Lab Results  Component Value Date   HGBA1C 5.9 (H) 06/13/2017   MPG 122.63 06/13/2017   Lab Results  Component Value Date   PROLACTIN 4.6 (L) 06/13/2017   Lab Results  Component Value Date   CHOL 183 06/13/2017   TRIG 80 06/13/2017   HDL 92 06/13/2017   CHOLHDL 2.0 06/13/2017   VLDL 16 06/13/2017   LDLCALC 75 06/13/2017   Lab Results  Component Value Date   TSH 1.205 06/13/2017    Therapeutic Level Labs: No results found for: LITHIUM No results found for: VALPROATE No components found for:  CBMZ  Current Medications: Current Outpatient Medications  Medication Sig Dispense Refill  . albuterol (PROVENTIL HFA;VENTOLIN HFA) 108 (90 Base) MCG/ACT inhaler Inhale into the lungs.    . ARIPiprazole (ABILIFY) 5 MG tablet Take 1 tablet (5 mg total) by mouth daily. 90 tablet 0  . buPROPion (WELLBUTRIN SR) 200 MG 12 hr tablet Take 1 tablet (200 mg total) by mouth 2 (two) times daily. 180 tablet 0  . Fluticasone-Salmeterol (ADVAIR DISKUS) 250-50 MCG/DOSE AEPB Inhale 1 puff into the lungs 2 (two) times daily. 180 each 1  . ibuprofen (ADVIL,MOTRIN) 800 MG tablet Take by mouth.    Marland Kitchen ipratropium-albuterol (DUONEB) 0.5-2.5 (3) MG/3ML SOLN Inhale into the lungs.    . lamoTRIgine (LAMICTAL) 100 MG tablet Take 1-2 tablets (100-200 mg total) by mouth daily. 100mg  qam and 200 mg qpm 270 tablet 0  . lisdexamfetamine (VYVANSE) 70 MG capsule Take 1 capsule (70 mg total) by mouth daily. 30  capsule 0  . losartan-hydrochlorothiazide (HYZAAR) 50-12.5 MG tablet Take by mouth.    . lubiprostone (AMITIZA) 24 MCG capsule Take 24 mcg by mouth daily with breakfast.     . MAGNESIUM PO Take by mouth daily.    . montelukast (SINGULAIR) 10 MG tablet Take 10 mg by mouth at bedtime.     . Multiple Vitamins-Minerals (CENTRUM SILVER PO) Take by mouth daily.    Marland Kitchen omeprazole (PRILOSEC) 40 MG capsule Take by mouth.    . SPIRIVA RESPIMAT 2.5 MCG/ACT AERS Inhale 2 puffs into the  lungs at bedtime.    . valACYclovir (VALTREX) 1000 MG tablet Take by mouth.    . gabapentin (NEURONTIN) 300 MG capsule Take 300 mg 3 (three) times daily by mouth.      No current facility-administered medications for this visit.      Musculoskeletal: Strength & Muscle Tone: within normal limits Gait & Station: normal Patient leans: N/A  Psychiatric Specialty Exam: Review of Systems  Psychiatric/Behavioral: The patient has insomnia (improving).   All other systems reviewed and are negative.   Blood pressure 134/90, pulse 95, temperature 97.6 F (36.4 C), temperature source Oral, weight 129 lb 9.6 oz (58.8 kg).Body mass index is 25.31 kg/m.  General Appearance: Casual  Eye Contact:  Fair  Speech:  Normal Rate  Volume:  Normal  Mood:  Euthymic  Affect:  Congruent  Thought Process:  Goal Directed and Descriptions of Associations: Intact  Orientation:  Full (Time, Place, and Person)  Thought Content: Logical   Suicidal Thoughts:  No  Homicidal Thoughts:  No  Memory:  Immediate;   Fair Recent;   Fair Remote;   Fair  Judgement:  Fair  Insight:  Fair  Psychomotor Activity:  Normal  Concentration:  Concentration: Fair and Attention Span: Fair  Recall:  [employer] of Knowledge: Fair  Language: Fair  Akathisia:  No  Handed:  Right  AIMS (if indicated): denies tremors, rigidity  Assets:  Communication Skills Desire for Improvement Housing Social Support  ADL's:  Intact  Cognition: WNL  Sleep:  Fair    Screenings: PHQ2-9     Pulmonary Rehab from 06/25/2017 in [facility]  PHQ-2 Total Score  4  PHQ-9 Total Score  19       Assessment and Plan: Karima is a 57 year old female who has a history of bipolar disorder, ADHD, cognitive disorder, COPD, GERD, presented to the clinic today for a follow-up visit.  Patient reports she continues to struggle with some chronic back pain problems.  She otherwise denies any significant concerns.  She reports her current medication is helpful.  She will continue psychotherapy with Ms. Peacock.  Plan as noted below.  Plan Bipolar disorder Continue Lamictal 300 mg p.o. daily in divided doses. Continue Wellbutrin SR 200 mg p.o. twice daily Continue Abilify 5 mg p.o. nightly Provided medication education, provided handouts.  Anxiety symptoms Continue CBT with Ms. Peacock. Continue hydroxyzine 25 mg p.o. as needed  For insomnia Continue melatonin.  Reports she is sleeping better.  She has not required much melatonin and takes it as needed.  For ADHD I have reviewed [place] controlled substance database. Continue Vyvanse 70 mg p.o. daily. Discussed with patient to reach out to writer after she discuss with her  health insurance whether she can have 90-day supplies.  She will reach out to me this Friday.  Follow-up in clinic in 2-3 months or sooner if needed.  More than 50 % of the time was spent for psychoeducation and supportive psychotherapy and care coordination.  This note was generated in part or whole with voice recognition software. Voice recognition is usually quite accurate but there are transcription errors that can and very often do occur. I apologize for any typographical errors that were not detected and corrected.         [name], MD 10/09/2017, 1:29 PM

## 2017-10-10 ENCOUNTER — Telehealth: Payer: Self-pay

## 2017-10-10 ENCOUNTER — Ambulatory Visit: Admitting: Psychiatry

## 2017-10-10 DIAGNOSIS — F900 Attention-deficit hyperactivity disorder, predominantly inattentive type: Secondary | ICD-10-CM

## 2017-10-10 NOTE — Telephone Encounter (Signed)
pt called states that she can do a 90 day supply of the vyvanse please send in a rx for 90 day supply

## 2017-10-12 MED ORDER — LISDEXAMFETAMINE DIMESYLATE 70 MG PO CAPS: 70.0000 mg | ORAL_CAPSULE | Freq: Every day | ORAL | 0 refills | Status: DC

## 2017-10-12 NOTE — Telephone Encounter (Signed)
vyvanse 70 mg sent to pharmacy for 90 days - express script

## 2017-10-16 ENCOUNTER — Ambulatory Visit: Admitting: Licensed Clinical Social Worker

## 2017-11-07 ENCOUNTER — Ambulatory Visit: Admitting: Licensed Clinical Social Worker

## 2017-11-21 ENCOUNTER — Telehealth: Payer: Self-pay | Admitting: Internal Medicine

## 2017-11-21 ENCOUNTER — Ambulatory Visit: Admitting: Licensed Clinical Social Worker

## 2017-11-21 NOTE — Telephone Encounter (Signed)
Patient dropped off clearance form from [facility] at [facility] scheduled with Dr. Meade Maw on 12/26/17 but does have available time on 12/05/17 if clearance is approved in time Form placed in nurse box  Please advise

## 2017-12-04 ENCOUNTER — Other Ambulatory Visit: Payer: Self-pay

## 2017-12-04 ENCOUNTER — Encounter
Admission: RE | Admit: 2017-12-04 | Discharge: 2017-12-04 | Disposition: A | Discharge status: Home / Self Care | Source: Ambulatory Visit | Attending: Neurosurgery | Admitting: Neurosurgery

## 2017-12-04 DIAGNOSIS — J449 Chronic obstructive pulmonary disease, unspecified: Secondary | ICD-10-CM | POA: Insufficient documentation

## 2017-12-04 DIAGNOSIS — Z01810 Encounter for preprocedural cardiovascular examination: Secondary | ICD-10-CM | POA: Insufficient documentation

## 2017-12-04 DIAGNOSIS — Z01812 Encounter for preprocedural laboratory examination: Secondary | ICD-10-CM | POA: Insufficient documentation

## 2017-12-04 HISTORY — DX: Sleep apnea, unspecified: G47.30

## 2017-12-04 HISTORY — DX: Pneumonia, unspecified organism: J18.9

## 2017-12-04 HISTORY — DX: Major depressive disorder, single episode, unspecified: F32.9

## 2017-12-04 HISTORY — DX: Gastro-esophageal reflux disease without esophagitis: K21.9

## 2017-12-04 HISTORY — DX: Depression, unspecified: F32.A

## 2017-12-04 LAB — BASIC METABOLIC PANEL
Anion gap: 10 (ref 5–15)
BUN: 16 mg/dL (ref 6–20)
CHLORIDE: 104 mmol/L (ref 98–111)
CO2: 26 mmol/L (ref 22–32)
CREATININE: 0.67 mg/dL (ref 0.44–1.00)
Calcium: 10.1 mg/dL (ref 8.9–10.3)
GFR calc non Af Amer: >60 mL/min (ref >60)
Glucose, Bld: 98 mg/dL (ref 70–99)
Potassium: 4.2 mmol/L (ref 3.5–5.1)
SODIUM: 140 mmol/L (ref 135–145)

## 2017-12-04 LAB — PROTIME-INR
INR: 0.96
PROTHROMBIN TIME: 12.7 s (ref 11.4–15.2)

## 2017-12-04 LAB — TYPE AND SCREEN
ABO/RH(D): A POS
Antibody Screen: NEGATIVE

## 2017-12-04 LAB — URINALYSIS, ROUTINE W REFLEX MICROSCOPIC
BILIRUBIN URINE: NEGATIVE
Glucose, UA: NEGATIVE mg/dL
Hgb urine dipstick: NEGATIVE
KETONES UR: NEGATIVE mg/dL
LEUKOCYTES UA: NEGATIVE
NITRITE: NEGATIVE
PH: 8 (ref 5.0–8.0)
Protein, ur: NEGATIVE mg/dL
Specific Gravity, Urine: 1.01 (ref 1.005–1.030)

## 2017-12-04 LAB — CBC
HEMATOCRIT: 41.4 % (ref 35.0–47.0)
HEMOGLOBIN: 13.8 g/dL (ref 12.0–16.0)
MCH: 30.2 pg (ref 26.0–34.0)
MCHC: 33.4 g/dL (ref 32.0–36.0)
MCV: 90.5 fL (ref 80.0–100.0)
Platelets: 363 10*3/uL (ref 150–440)
RBC: 4.58 MIL/uL (ref 3.80–5.20)
RDW: 14.3 % (ref 11.5–14.5)
WBC: 8.8 10*3/uL (ref 3.6–11.0)

## 2017-12-04 LAB — SURGICAL PCR SCREEN
MRSA, PCR: NEGATIVE
STAPHYLOCOCCUS AUREUS: NEGATIVE

## 2017-12-04 LAB — APTT: aPTT: 31 seconds (ref 24–36)

## 2017-12-04 NOTE — Patient Instructions (Signed)
  Your procedure is scheduled on: Wednesday December 05, 2017 Report to Same Day Surgery 2nd floor medical mall ([facility] Entrance-take elevator on left to 2nd floor.  Check in with surgery information desk.) To find out your arrival time please call [phone] between 1PM - 3PM on Tuesday December 04, 2017  Remember: Instructions that are not followed completely may result in serious medical risk, up to and including death, or upon the discretion of your surgeon and anesthesiologist your surgery may need to be rescheduled.    _x___ 1. Do not eat food (including mints, candies, chewing gum) after midnight the night before your procedure. You may drink clear liquids up to 2 hours before you are scheduled to arrive at the hospital for your procedure.  Do not drink clear liquids within 2 hours of your scheduled arrival to the hospital.  Clear liquids include  --Water or Apple juice without pulp  --Clear carbohydrate beverage such as Gatorade  --Black Coffee or Clear Tea (No milk, no creamers, do not add anything to the coffee or tea)     __x__ 2. No Alcohol for 24 hours before or after surgery.   __x__3. No Smoking or e-cigarettes for 24 prior to surgery.  Do not use any chewable tobacco products for at least 6 hour prior to surgery   ____  4. Bring all medications with you on the day of surgery if instructed.    __x__ 5. Notify your doctor if there is any change in your medical condition     (cold, fever, infections).   __x__6. On the morning of surgery brush your teeth with toothpaste and water.  You may rinse your mouth with mouth wash if you wish.  Do not swallow any toothpaste or mouthwash.   Do not wear jewelry, make-up, hairpins, clips or nail polish.  Do not wear lotions, powders, deodorant, or perfumes.   Do not shave 48 hours prior to surgery.   Do not bring valuables to the hospital.    [facility] is not responsible for any belongings or valuables.               Contacts,  dentures or bridgework may not be worn into surgery.  Leave your suitcase in the car. After surgery it may be brought to your room.  For patients admitted to the hospital, discharge time is determined by your treatment team.  Please read over the following fact sheets that you were given:   [facility] Preparing for Surgery and or MRSA Information   _x___ Take anti-hypertensive listed below, cardiac, seizure, asthma, anti-reflux and psychiatric medicines. These include:  1. Gabapentin/Neurontin  2. Omeprazole/Prilosec  3. Bupropion/Wellbutrin  4. Lisdexamfetamine/Vyvanse  5. Lamotrigine/Lamictal   _x___ Use CHG Soap or sage wipes as directed on instruction sheet   _x___ Use inhalers on the day of surgery and bring to hospital day of surgery  _x___ Follow recommendations from Cardiologist, Pulmonologist or PCP regarding stopping Aspirin, Coumadin, Plavix ,Eliquis, Effient, or Pradaxa, and Pletal.  _x___ NOW: Stop Anti-inflammatories such as Advil, Aleve, Ibuprofen, Motrin, Naproxen, Naprosyn, Goodies powders or aspirin products. OK to take Tylenol and Celebrex.   _x___ NOW: Stop supplements (including Milk Thistle) until after surgery.  But may continue Vitamin D, Vitamin B, and multivitamin.

## 2017-12-05 ENCOUNTER — Observation Stay

## 2017-12-05 ENCOUNTER — Other Ambulatory Visit: Payer: Self-pay

## 2017-12-05 ENCOUNTER — Ambulatory Visit: Admitting: Licensed Clinical Social Worker

## 2017-12-05 ENCOUNTER — Inpatient Hospital Stay

## 2017-12-05 ENCOUNTER — Inpatient Hospital Stay: Admitting: Certified Registered Nurse Anesthetist

## 2017-12-05 ENCOUNTER — Encounter
Admission: RE | Disposition: A | Discharge status: Home Health | Payer: Self-pay | Source: Home / Self Care | Attending: Neurosurgery

## 2017-12-05 ENCOUNTER — Encounter: Payer: Self-pay | Admitting: *Deleted

## 2017-12-05 ENCOUNTER — Inpatient Hospital Stay
Admission: RE | Admit: 2017-12-05 | Discharge: 2017-12-07 | DRG: 455 | Disposition: A | Discharge status: Home Health | Attending: Neurosurgery | Admitting: Neurosurgery

## 2017-12-05 DIAGNOSIS — Z8589 Personal history of malignant neoplasm of other organs and systems: Secondary | ICD-10-CM

## 2017-12-05 DIAGNOSIS — Z885 Allergy status to narcotic agent status: Secondary | ICD-10-CM

## 2017-12-05 DIAGNOSIS — Z87891 Personal history of nicotine dependence: Secondary | ICD-10-CM

## 2017-12-05 DIAGNOSIS — Z818 Family history of other mental and behavioral disorders: Secondary | ICD-10-CM

## 2017-12-05 DIAGNOSIS — M4316 Spondylolisthesis, lumbar region: Secondary | ICD-10-CM | POA: Diagnosis present

## 2017-12-05 DIAGNOSIS — Z8249 Family history of ischemic heart disease and other diseases of the circulatory system: Secondary | ICD-10-CM

## 2017-12-05 DIAGNOSIS — Z800 Family history of malignant neoplasm of digestive organs: Secondary | ICD-10-CM

## 2017-12-05 DIAGNOSIS — M5416 Radiculopathy, lumbar region: Secondary | ICD-10-CM | POA: Diagnosis present

## 2017-12-05 DIAGNOSIS — M48062 Spinal stenosis, lumbar region with neurogenic claudication: Principal | ICD-10-CM | POA: Diagnosis present

## 2017-12-05 DIAGNOSIS — Z9049 Acquired absence of other specified parts of digestive tract: Secondary | ICD-10-CM

## 2017-12-05 DIAGNOSIS — Z8051 Family history of malignant neoplasm of kidney: Secondary | ICD-10-CM

## 2017-12-05 DIAGNOSIS — F902 Attention-deficit hyperactivity disorder, combined type: Secondary | ICD-10-CM | POA: Diagnosis present

## 2017-12-05 DIAGNOSIS — J449 Chronic obstructive pulmonary disease, unspecified: Secondary | ICD-10-CM | POA: Diagnosis present

## 2017-12-05 DIAGNOSIS — Z833 Family history of diabetes mellitus: Secondary | ICD-10-CM

## 2017-12-05 DIAGNOSIS — F319 Bipolar disorder, unspecified: Secondary | ICD-10-CM | POA: Diagnosis present

## 2017-12-05 DIAGNOSIS — Z90710 Acquired absence of both cervix and uterus: Secondary | ICD-10-CM

## 2017-12-05 DIAGNOSIS — I10 Essential (primary) hypertension: Secondary | ICD-10-CM | POA: Diagnosis present

## 2017-12-05 DIAGNOSIS — Z7951 Long term (current) use of inhaled steroids: Secondary | ICD-10-CM

## 2017-12-05 DIAGNOSIS — Z79899 Other long term (current) drug therapy: Secondary | ICD-10-CM

## 2017-12-05 DIAGNOSIS — K219 Gastro-esophageal reflux disease without esophagitis: Secondary | ICD-10-CM | POA: Diagnosis present

## 2017-12-05 DIAGNOSIS — Z981 Arthrodesis status: Secondary | ICD-10-CM

## 2017-12-05 DIAGNOSIS — G3184 Mild cognitive impairment, so stated: Secondary | ICD-10-CM | POA: Diagnosis present

## 2017-12-05 DIAGNOSIS — Z419 Encounter for procedure for purposes other than remedying health state, unspecified: Secondary | ICD-10-CM

## 2017-12-05 DIAGNOSIS — Z825 Family history of asthma and other chronic lower respiratory diseases: Secondary | ICD-10-CM

## 2017-12-05 HISTORY — PX: TRANSFORAMINAL LUMBAR INTERBODY FUSION (TLIF) WITH PEDICLE SCREW FIXATION 1 LEVEL: SHX6141

## 2017-12-05 LAB — ABO/RH: ABO/RH(D): A POS

## 2017-12-05 SURGERY — TRANSFORAMINAL LUMBAR INTERBODY FUSION (TLIF) WITH PEDICLE SCREW FIXATION 1 LEVEL
Anesthesia: General | Wound class: Clean

## 2017-12-05 MED ORDER — HYDROMORPHONE HCL 1 MG/ML IJ SOLN
INTRAMUSCULAR | Status: DC | PRN
  Administered 2017-12-05 (×2): 0.5 mg via INTRAVENOUS

## 2017-12-05 MED ORDER — KETOROLAC TROMETHAMINE 30 MG/ML IJ SOLN
INTRAMUSCULAR | Status: AC
  Filled 2017-12-05: qty 1

## 2017-12-05 MED ORDER — HYDROMORPHONE HCL 1 MG/ML IJ SOLN
INTRAMUSCULAR | Status: AC
  Administered 2017-12-05: 0.25 mg via INTRAVENOUS
  Filled 2017-12-05: qty 1

## 2017-12-05 MED ORDER — SENNOSIDES-DOCUSATE SODIUM 8.6-50 MG PO TABS: 1.0000 | ORAL_TABLET | Freq: Every evening | ORAL | Status: DC | PRN

## 2017-12-05 MED ORDER — SODIUM CHLORIDE 0.9% FLUSH: 3.0000 mL | INTRAVENOUS | Status: DC | PRN

## 2017-12-05 MED ORDER — HYDROCHLOROTHIAZIDE 12.5 MG PO CAPS
12.5000 mg | ORAL_CAPSULE | Freq: Every day | ORAL | Status: DC
  Administered 2017-12-05 – 2017-12-06 (×2): 12.5 mg via ORAL
  Filled 2017-12-05 (×2): qty 1

## 2017-12-05 MED ORDER — LOSARTAN POTASSIUM-HCTZ 50-12.5 MG PO TABS: 1.0000 | ORAL_TABLET | Freq: Every day | ORAL | Status: DC

## 2017-12-05 MED ORDER — DEXAMETHASONE SODIUM PHOSPHATE 10 MG/ML IJ SOLN
INTRAMUSCULAR | Status: AC
  Filled 2017-12-05: qty 1

## 2017-12-05 MED ORDER — PROPOFOL 500 MG/50ML IV EMUL
INTRAVENOUS | Status: DC | PRN
  Administered 2017-12-05: 100 ug/kg/min via INTRAVENOUS

## 2017-12-05 MED ORDER — PROPOFOL 10 MG/ML IV BOLUS
INTRAVENOUS | Status: AC
  Filled 2017-12-05: qty 20

## 2017-12-05 MED ORDER — OXYCODONE HCL 5 MG PO TABS
10.0000 mg | ORAL_TABLET | ORAL | Status: DC | PRN
  Administered 2017-12-06 – 2017-12-07 (×6): 10 mg via ORAL
  Filled 2017-12-05 (×6): qty 2

## 2017-12-05 MED ORDER — SODIUM CHLORIDE 0.9 % IV SOLN
INTRAVENOUS | Status: DC
  Administered 2017-12-05: 13:00:00 via INTRAVENOUS

## 2017-12-05 MED ORDER — MOMETASONE FURO-FORMOTEROL FUM 200-5 MCG/ACT IN AERO
2.0000 | INHALATION_SPRAY | Freq: Two times a day (BID) | RESPIRATORY_TRACT | Status: DC
  Filled 2017-12-05: qty 8.8

## 2017-12-05 MED ORDER — LIDOCAINE HCL (PF) 2 % IJ SOLN
INTRAMUSCULAR | Status: AC
  Filled 2017-12-05: qty 10

## 2017-12-05 MED ORDER — IPRATROPIUM-ALBUTEROL 0.5-2.5 (3) MG/3ML IN SOLN: 3.0000 mL | RESPIRATORY_TRACT | Status: DC | PRN

## 2017-12-05 MED ORDER — BACITRACIN 50000 UNITS IM SOLR
INTRAMUSCULAR | Status: AC
  Filled 2017-12-05: qty 1

## 2017-12-05 MED ORDER — OXYCODONE HCL 5 MG/5ML PO SOLN: 5.0000 mg | Freq: Once | ORAL | Status: DC | PRN

## 2017-12-05 MED ORDER — LAMOTRIGINE 100 MG PO TABS
200.0000 mg | ORAL_TABLET | Freq: Every day | ORAL | Status: DC
  Administered 2017-12-05 – 2017-12-06 (×2): 200 mg via ORAL
  Filled 2017-12-05 (×2): qty 2
  Filled 2017-12-05: qty 8
  Filled 2017-12-05: qty 2

## 2017-12-05 MED ORDER — METHOCARBAMOL 1000 MG/10ML IJ SOLN
500.0000 mg | Freq: Four times a day (QID) | INTRAVENOUS | Status: DC | PRN
  Filled 2017-12-05: qty 5

## 2017-12-05 MED ORDER — PROPOFOL 10 MG/ML IV BOLUS
INTRAVENOUS | Status: DC | PRN
  Administered 2017-12-05: 120 mg via INTRAVENOUS

## 2017-12-05 MED ORDER — LISDEXAMFETAMINE DIMESYLATE 30 MG PO CAPS
30.0000 mg | ORAL_CAPSULE | Freq: Every day | ORAL | Status: DC
Start: 2017-12-06 — End: 2017-12-07
  Administered 2017-12-06 – 2017-12-07 (×2): 30 mg via ORAL
  Filled 2017-12-05 (×2): qty 1

## 2017-12-05 MED ORDER — BUPROPION HCL ER (SR) 100 MG PO TB12
200.0000 mg | ORAL_TABLET | Freq: Two times a day (BID) | ORAL | Status: DC
  Administered 2017-12-05 – 2017-12-07 (×4): 200 mg via ORAL
  Filled 2017-12-05 (×5): qty 2

## 2017-12-05 MED ORDER — DEXAMETHASONE SODIUM PHOSPHATE 10 MG/ML IJ SOLN
INTRAMUSCULAR | Status: DC | PRN
  Administered 2017-12-05: 10 mg via INTRAVENOUS

## 2017-12-05 MED ORDER — EPHEDRINE SULFATE 50 MG/ML IJ SOLN
INTRAMUSCULAR | Status: AC
  Filled 2017-12-05: qty 1

## 2017-12-05 MED ORDER — BUPIVACAINE-EPINEPHRINE (PF) 0.5% -1:200000 IJ SOLN
INTRAMUSCULAR | Status: AC
  Filled 2017-12-05: qty 30

## 2017-12-05 MED ORDER — FENTANYL CITRATE (PF) 100 MCG/2ML IJ SOLN
INTRAMUSCULAR | Status: AC
  Administered 2017-12-05: 50 ug via INTRAVENOUS
  Filled 2017-12-05: qty 2

## 2017-12-05 MED ORDER — SUCCINYLCHOLINE CHLORIDE 20 MG/ML IJ SOLN
INTRAMUSCULAR | Status: AC
  Filled 2017-12-05: qty 1

## 2017-12-05 MED ORDER — PHENYLEPHRINE HCL 10 MG/ML IJ SOLN
INTRAMUSCULAR | Status: AC
  Filled 2017-12-05: qty 1

## 2017-12-05 MED ORDER — PROPOFOL 500 MG/50ML IV EMUL
INTRAVENOUS | Status: AC
  Filled 2017-12-05: qty 50

## 2017-12-05 MED ORDER — TIOTROPIUM BROMIDE MONOHYDRATE 18 MCG IN CAPS
1.0000 | ORAL_CAPSULE | Freq: Every morning | RESPIRATORY_TRACT | Status: DC
  Administered 2017-12-06 – 2017-12-07 (×2): 18 ug via RESPIRATORY_TRACT
  Filled 2017-12-05: qty 5

## 2017-12-05 MED ORDER — HYDROMORPHONE HCL 1 MG/ML IJ SOLN
0.2500 mg | INTRAMUSCULAR | Status: DC | PRN
  Administered 2017-12-05 (×2): 0.25 mg via INTRAVENOUS

## 2017-12-05 MED ORDER — FENTANYL CITRATE (PF) 250 MCG/5ML IJ SOLN
INTRAMUSCULAR | Status: AC
  Filled 2017-12-05: qty 5

## 2017-12-05 MED ORDER — ALBUTEROL SULFATE HFA 108 (90 BASE) MCG/ACT IN AERS: 1.0000 | INHALATION_SPRAY | RESPIRATORY_TRACT | Status: DC | PRN

## 2017-12-05 MED ORDER — FENTANYL CITRATE (PF) 100 MCG/2ML IJ SOLN
INTRAMUSCULAR | Status: DC | PRN
  Administered 2017-12-05 (×3): 50 ug via INTRAVENOUS
  Administered 2017-12-05: 100 ug via INTRAVENOUS

## 2017-12-05 MED ORDER — ACETAMINOPHEN 325 MG PO TABS: 650.0000 mg | ORAL_TABLET | ORAL | Status: DC | PRN

## 2017-12-05 MED ORDER — LIDOCAINE HCL (CARDIAC) PF 100 MG/5ML IV SOSY
PREFILLED_SYRINGE | INTRAVENOUS | Status: DC | PRN
  Administered 2017-12-05: 100 mg via INTRAVENOUS

## 2017-12-05 MED ORDER — LISDEXAMFETAMINE DIMESYLATE 30 MG PO CAPS: 70.0000 mg | ORAL_CAPSULE | Freq: Every day | ORAL | Status: DC

## 2017-12-05 MED ORDER — ONDANSETRON HCL 4 MG/2ML IJ SOLN
INTRAMUSCULAR | Status: DC | PRN
  Administered 2017-12-05: 4 mg via INTRAVENOUS

## 2017-12-05 MED ORDER — SODIUM CHLORIDE 0.9 % IV SOLN: 250.0000 mL | INTRAVENOUS | Status: DC

## 2017-12-05 MED ORDER — PANTOPRAZOLE SODIUM 40 MG PO TBEC
40.0000 mg | DELAYED_RELEASE_TABLET | Freq: Every day | ORAL | Status: DC
  Administered 2017-12-06 – 2017-12-07 (×2): 40 mg via ORAL
  Filled 2017-12-05 (×2): qty 1

## 2017-12-05 MED ORDER — FENTANYL CITRATE (PF) 100 MCG/2ML IJ SOLN
25.0000 ug | INTRAMUSCULAR | Status: DC | PRN
  Administered 2017-12-05: 50 ug via INTRAVENOUS
  Administered 2017-12-05 (×2): 25 ug via INTRAVENOUS
  Administered 2017-12-05 (×2): 50 ug via INTRAVENOUS

## 2017-12-05 MED ORDER — SODIUM CHLORIDE FLUSH 0.9 % IV SOLN
INTRAVENOUS | Status: AC
  Filled 2017-12-05: qty 60

## 2017-12-05 MED ORDER — LISDEXAMFETAMINE DIMESYLATE 20 MG PO CAPS
40.0000 mg | ORAL_CAPSULE | Freq: Every day | ORAL | Status: DC
  Administered 2017-12-06 – 2017-12-07 (×2): 40 mg via ORAL
  Filled 2017-12-05 (×2): qty 2

## 2017-12-05 MED ORDER — MENTHOL 3 MG MT LOZG: 1.0000 | LOZENGE | OROMUCOSAL | Status: DC | PRN

## 2017-12-05 MED ORDER — SODIUM CHLORIDE 0.9 % IV SOLN
INTRAVENOUS | Status: DC | PRN
  Administered 2017-12-05: 40 mL

## 2017-12-05 MED ORDER — BUPIVACAINE HCL (PF) 0.5 % IJ SOLN
INTRAMUSCULAR | Status: DC | PRN
  Administered 2017-12-05: 20 mL

## 2017-12-05 MED ORDER — ONDANSETRON HCL 4 MG/2ML IJ SOLN
INTRAMUSCULAR | Status: AC
  Filled 2017-12-05: qty 2

## 2017-12-05 MED ORDER — MIDAZOLAM HCL 2 MG/2ML IJ SOLN
INTRAMUSCULAR | Status: AC
  Filled 2017-12-05: qty 2

## 2017-12-05 MED ORDER — LOSARTAN POTASSIUM 50 MG PO TABS
50.0000 mg | ORAL_TABLET | Freq: Every day | ORAL | Status: DC
  Administered 2017-12-05 – 2017-12-06 (×2): 50 mg via ORAL
  Filled 2017-12-05 (×2): qty 1

## 2017-12-05 MED ORDER — OXYCODONE HCL 5 MG PO TABS: 5.0000 mg | ORAL_TABLET | Freq: Once | ORAL | Status: DC | PRN

## 2017-12-05 MED ORDER — OXYCODONE HCL 5 MG PO TABS
5.0000 mg | ORAL_TABLET | ORAL | Status: DC | PRN
  Administered 2017-12-05 (×3): 5 mg via ORAL
  Filled 2017-12-05 (×3): qty 1

## 2017-12-05 MED ORDER — GABAPENTIN 300 MG PO CAPS
300.0000 mg | ORAL_CAPSULE | Freq: Three times a day (TID) | ORAL | Status: DC
  Administered 2017-12-05 – 2017-12-07 (×6): 300 mg via ORAL
  Filled 2017-12-05 (×6): qty 1

## 2017-12-05 MED ORDER — HYDROMORPHONE HCL 1 MG/ML IJ SOLN: 0.5000 mg | INTRAMUSCULAR | Status: DC | PRN

## 2017-12-05 MED ORDER — ALBUTEROL SULFATE (2.5 MG/3ML) 0.083% IN NEBU: 2.5000 mg | INHALATION_SOLUTION | RESPIRATORY_TRACT | Status: DC | PRN

## 2017-12-05 MED ORDER — SODIUM CHLORIDE 0.9% FLUSH
3.0000 mL | Freq: Two times a day (BID) | INTRAVENOUS | Status: DC
  Administered 2017-12-05 – 2017-12-06 (×4): 3 mL via INTRAVENOUS

## 2017-12-05 MED ORDER — ACETAMINOPHEN 10 MG/ML IV SOLN
INTRAVENOUS | Status: AC
  Filled 2017-12-05: qty 100

## 2017-12-05 MED ORDER — METHYLPREDNISOLONE ACETATE 40 MG/ML IJ SUSP
INTRAMUSCULAR | Status: AC
  Filled 2017-12-05: qty 1

## 2017-12-05 MED ORDER — HYDROMORPHONE HCL 1 MG/ML IJ SOLN
INTRAMUSCULAR | Status: AC
  Filled 2017-12-05: qty 1

## 2017-12-05 MED ORDER — SODIUM CHLORIDE 0.9 % IR SOLN
Status: DC | PRN
  Administered 2017-12-05: 08:00:00

## 2017-12-05 MED ORDER — ACETAMINOPHEN 650 MG RE SUPP: 650.0000 mg | RECTAL | Status: DC | PRN

## 2017-12-05 MED ORDER — CEFAZOLIN SODIUM-DEXTROSE 1-4 GM/50ML-% IV SOLN
INTRAVENOUS | Status: AC
  Filled 2017-12-05: qty 50

## 2017-12-05 MED ORDER — VANCOMYCIN HCL IN DEXTROSE 1-5 GM/200ML-% IV SOLN
INTRAVENOUS | Status: AC
  Administered 2017-12-05: 1000 mg via INTRAVENOUS
  Filled 2017-12-05: qty 200

## 2017-12-05 MED ORDER — BUPIVACAINE HCL (PF) 0.5 % IJ SOLN
INTRAMUSCULAR | Status: AC
  Filled 2017-12-05: qty 30

## 2017-12-05 MED ORDER — PHENOL 1.4 % MT LIQD: 1.0000 | OROMUCOSAL | Status: DC | PRN

## 2017-12-05 MED ORDER — BUPIVACAINE-EPINEPHRINE (PF) 0.5% -1:200000 IJ SOLN
INTRAMUSCULAR | Status: DC | PRN
  Administered 2017-12-05: 10 mL

## 2017-12-05 MED ORDER — ACETAMINOPHEN 500 MG PO TABS
1000.0000 mg | ORAL_TABLET | Freq: Four times a day (QID) | ORAL | Status: AC
  Administered 2017-12-05 – 2017-12-06 (×2): 1000 mg via ORAL
  Filled 2017-12-05 (×2): qty 2

## 2017-12-05 MED ORDER — HYDROCODONE-ACETAMINOPHEN 7.5-325 MG PO TABS: 1.0000 | ORAL_TABLET | Freq: Once | ORAL | Status: DC | PRN

## 2017-12-05 MED ORDER — ONDANSETRON HCL 4 MG/2ML IJ SOLN: 4.0000 mg | Freq: Four times a day (QID) | INTRAMUSCULAR | Status: DC | PRN

## 2017-12-05 MED ORDER — SUCCINYLCHOLINE CHLORIDE 20 MG/ML IJ SOLN
INTRAMUSCULAR | Status: DC | PRN
  Administered 2017-12-05: 100 mg via INTRAVENOUS

## 2017-12-05 MED ORDER — MONTELUKAST SODIUM 10 MG PO TABS
10.0000 mg | ORAL_TABLET | Freq: Every day | ORAL | Status: DC
  Administered 2017-12-05 – 2017-12-06 (×2): 10 mg via ORAL
  Filled 2017-12-05 (×2): qty 1

## 2017-12-05 MED ORDER — ROCURONIUM BROMIDE 50 MG/5ML IV SOLN
INTRAVENOUS | Status: AC
  Filled 2017-12-05: qty 1

## 2017-12-05 MED ORDER — MOMETASONE FURO-FORMOTEROL FUM 200-5 MCG/ACT IN AERO
2.0000 | INHALATION_SPRAY | Freq: Two times a day (BID) | RESPIRATORY_TRACT | Status: DC
  Administered 2017-12-05 – 2017-12-07 (×4): 2 via RESPIRATORY_TRACT
  Filled 2017-12-05: qty 8.8

## 2017-12-05 MED ORDER — PHENYLEPHRINE HCL 10 MG/ML IJ SOLN
INTRAMUSCULAR | Status: DC | PRN
  Administered 2017-12-05: 100 ug via INTRAVENOUS

## 2017-12-05 MED ORDER — ONDANSETRON HCL 4 MG PO TABS: 4.0000 mg | ORAL_TABLET | Freq: Four times a day (QID) | ORAL | Status: DC | PRN

## 2017-12-05 MED ORDER — MIDAZOLAM HCL 2 MG/2ML IJ SOLN
INTRAMUSCULAR | Status: DC | PRN
  Administered 2017-12-05: 2 mg via INTRAVENOUS

## 2017-12-05 MED ORDER — CEFAZOLIN SODIUM-DEXTROSE 1-4 GM/50ML-% IV SOLN
1.0000 g | Freq: Once | INTRAVENOUS | Status: AC
  Administered 2017-12-05: 1 g via INTRAVENOUS

## 2017-12-05 MED ORDER — BUPIVACAINE LIPOSOME 1.3 % IJ SUSP
INTRAMUSCULAR | Status: AC
Start: 2017-12-05 — End: ?
  Filled 2017-12-05: qty 20

## 2017-12-05 MED ORDER — VANCOMYCIN HCL IN DEXTROSE 1-5 GM/200ML-% IV SOLN
1000.0000 mg | Freq: Once | INTRAVENOUS | Status: AC
  Administered 2017-12-05: 1000 mg via INTRAVENOUS

## 2017-12-05 MED ORDER — LAMOTRIGINE 25 MG PO TABS: 100.0000 mg | ORAL_TABLET | ORAL | Status: DC

## 2017-12-05 MED ORDER — METHOCARBAMOL 500 MG PO TABS
500.0000 mg | ORAL_TABLET | Freq: Four times a day (QID) | ORAL | Status: DC | PRN
  Administered 2017-12-05 (×2): 500 mg via ORAL
  Filled 2017-12-05 (×2): qty 1

## 2017-12-05 MED ORDER — THROMBIN 5000 UNITS EX SOLR
CUTANEOUS | Status: AC
  Filled 2017-12-05: qty 5000

## 2017-12-05 MED ORDER — LAMOTRIGINE 100 MG PO TABS
100.0000 mg | ORAL_TABLET | Freq: Every day | ORAL | Status: DC
  Administered 2017-12-06 – 2017-12-07 (×2): 100 mg via ORAL
  Filled 2017-12-05 (×2): qty 1
  Filled 2017-12-05 (×2): qty 4

## 2017-12-05 MED ORDER — KETOROLAC TROMETHAMINE 30 MG/ML IJ SOLN
INTRAMUSCULAR | Status: DC | PRN
  Administered 2017-12-05: 30 mg via INTRAVENOUS

## 2017-12-05 MED ORDER — ACETAMINOPHEN 10 MG/ML IV SOLN
INTRAVENOUS | Status: DC | PRN
  Administered 2017-12-05: 1000 mg via INTRAVENOUS

## 2017-12-05 MED ORDER — LUBIPROSTONE 24 MCG PO CAPS
24.0000 ug | ORAL_CAPSULE | Freq: Every day | ORAL | Status: DC
  Administered 2017-12-06 – 2017-12-07 (×2): 24 ug via ORAL
  Filled 2017-12-05 (×2): qty 1

## 2017-12-05 MED ORDER — LACTATED RINGERS IV SOLN
INTRAVENOUS | Status: DC
  Administered 2017-12-05: 07:00:00 via INTRAVENOUS

## 2017-12-05 SURGICAL SUPPLY — 71 items
BONE MATRIX OSTEOCEL PRO MED (Bone Implant) ×3 IMPLANT
BULB RESERV EVAC DRAIN JP 100C (MISCELLANEOUS) ×3 IMPLANT
BUR NEURO DRILL SOFT 3.0X3.8M (BURR) ×3 IMPLANT
CAGE SPINAL 8X11X26 15D (Cage) ×2 IMPLANT
CAGE SPINAL 8X11X26MM 15DEG (Cage) ×1 IMPLANT
CANISTER SUCT 1200ML W/VALVE (MISCELLANEOUS) ×6 IMPLANT
CHLORAPREP W/TINT 26ML (MISCELLANEOUS) ×6 IMPLANT
CLIP NEUROVISION LG (CLIP) ×3 IMPLANT
CNTNR SPEC 2.5X3XGRAD LEK (MISCELLANEOUS) ×1
CONT SPEC 4OZ STER OR WHT (MISCELLANEOUS) ×2
CONTAINER SPEC 2.5X3XGRAD LEK (MISCELLANEOUS) ×1 IMPLANT
COUNTER NEEDLE 20/40 LG (NEEDLE) ×3 IMPLANT
COVER LIGHT HANDLE STERIS (MISCELLANEOUS) ×6 IMPLANT
CUP MEDICINE 2OZ PLAST GRAD ST (MISCELLANEOUS) ×6 IMPLANT
DERMABOND ADVANCED (GAUZE/BANDAGES/DRESSINGS) ×2
DERMABOND ADVANCED .7 DNX12 (GAUZE/BANDAGES/DRESSINGS) ×1 IMPLANT
DRAIN CHANNEL JP 10F RND 20C F (MISCELLANEOUS) ×3 IMPLANT
DRAPE C-ARM 42X72 X-RAY (DRAPES) ×6 IMPLANT
DRAPE C-ARMOR (DRAPES) ×3 IMPLANT
DRAPE LAPAROTOMY 100X77 ABD (DRAPES) ×3 IMPLANT
DRAPE MICROSCOPE SPINE 48X150 (DRAPES) ×3 IMPLANT
DRAPE POUCH INSTRU U-SHP 10X18 (DRAPES) ×3 IMPLANT
DRAPE SURG 17X11 SM STRL (DRAPES) ×12 IMPLANT
ELECT CAUTERY BLADE TIP 2.5 (TIP) ×3
ELECT EZSTD 165MM 6.5IN (MISCELLANEOUS) ×3
ELECT REM PT RETURN 9FT ADLT (ELECTROSURGICAL) ×3
ELECTRODE CAUTERY BLDE TIP 2.5 (TIP) ×1 IMPLANT
ELECTRODE EZSTD 165MM 6.5IN (MISCELLANEOUS) ×1 IMPLANT
ELECTRODE REM PT RTRN 9FT ADLT (ELECTROSURGICAL) ×1 IMPLANT
FRAME EYE SHIELD (PROTECTIVE WEAR) ×6 IMPLANT
GLOVE BIO SURGEON STRL SZ 6.5 (GLOVE) ×4 IMPLANT
GLOVE BIO SURGEONS STRL SZ 6.5 (GLOVE) ×2
GLOVE BIOGEL PI IND STRL 7.0 (GLOVE) ×1 IMPLANT
GLOVE BIOGEL PI INDICATOR 7.0 (GLOVE) ×2
GLOVE SURG SYN 6.5 ES PF (GLOVE) ×3 IMPLANT
GLOVE SURG SYN 8.5  E (GLOVE) ×6
GLOVE SURG SYN 8.5 E (GLOVE) ×3 IMPLANT
GOWN SRG XL LVL 3 NONREINFORCE (GOWNS) ×1 IMPLANT
GOWN STRL NON-REIN TWL XL LVL3 (GOWNS) ×2
GOWN STRL REUS W/TWL MED LVL3 (GOWN DISPOSABLE) ×6 IMPLANT
GRADUATE 1200CC STRL 31836 (MISCELLANEOUS) ×3 IMPLANT
GUIDEWIRE NITINOL BEVEL TIP (WIRE) ×12 IMPLANT
KIT NEEDLE NVM5 EMG ELECT (KITS) ×1 IMPLANT
KIT NEEDLE NVM5 EMG ELECTRODE (KITS) ×2
KIT SPINAL PRONEVIEW (KITS) ×3 IMPLANT
KNIFE BAYONET SHORT DISCETOMY (MISCELLANEOUS) ×3 IMPLANT
MARKER SKIN DUAL TIP RULER LAB (MISCELLANEOUS) ×3 IMPLANT
NDL SAFETY ECLIPSE 18X1.5 (NEEDLE) ×1 IMPLANT
NEEDLE HYPO 18GX1.5 SHARP (NEEDLE) ×2
NEEDLE HYPO 22GX1.5 SAFETY (NEEDLE) ×3 IMPLANT
NEEDLE I PASS (NEEDLE) ×3 IMPLANT
NS IRRIG 1000ML POUR BTL (IV SOLUTION) ×3 IMPLANT
PACK LAMINECTOMY NEURO (CUSTOM PROCEDURE TRAY) ×3 IMPLANT
PAD ARMBOARD 7.5X6 YLW CONV (MISCELLANEOUS) ×3 IMPLANT
ROD RELINE MAS TI LORD 5.5X40 (Rod) ×6 IMPLANT
SCREW LOCK RELINE 5.5 TULIP (Screw) ×12 IMPLANT
SCREW RELINE MAS POLY 6.5X40MM (Screw) ×12 IMPLANT
SPOGE SURGIFLO 8M (HEMOSTASIS)
SPONGE SURGIFLO 8M (HEMOSTASIS) IMPLANT
SUT DVC VLOC 3-0 CL 6 P-12 (SUTURE) ×9 IMPLANT
SUT VIC AB 0 CT1 27 (SUTURE) ×2
SUT VIC AB 0 CT1 27XCR 8 STRN (SUTURE) ×1 IMPLANT
SUT VIC AB 2-0 CT1 18 (SUTURE) ×6 IMPLANT
SYR 10ML LL (SYRINGE) ×3 IMPLANT
SYR 20CC LL (SYRINGE) ×3 IMPLANT
SYR 30ML LL (SYRINGE) ×6 IMPLANT
SYR 3ML LL SCALE MARK (SYRINGE) ×3 IMPLANT
TOWEL OR 17X26 4PK STRL BLUE (TOWEL DISPOSABLE) ×9 IMPLANT
TRAY FOLEY SLVR 16FR LF STAT (SET/KITS/TRAYS/PACK) IMPLANT
TUBING CONNECTING 10 (TUBING) ×2 IMPLANT
TUBING CONNECTING 10' (TUBING) ×1

## 2017-12-05 NOTE — Progress Notes (Signed)
Procedure: L4-5 TLIF Procedure Date: 12/05/2017 Diagnosis: Lumbar radiculopathy  History: Bailey Flores is here for L4/5 TLIF for lumbar radiculopathy. Tolerated procedure well. Patient evaluated after procedure and still disoriented from anesthesia. Complains of 8/10 back pain. Denies lower extremity pain, numbness, or tingling.   Physical Exam: Vitals:   12/05/17 0622 12/05/17 1052  BP: (!) 161/100 (!) 150/97  Pulse: 87 (!) 107  Resp: 18 15  Temp: 98 F (36.7 C) 98.5 F (36.9 C)  SpO2: 96% 99%    AA Ox3 CNI Strength: 4+/5 IS bilaterally, 4/5 quads bilaterally, 4+/5 hamstring bilaterally. 5/5 DF and PF bilaterally. Sensation intact and symmetric lower extremities.   Data:  Recent Labs  Lab 12/04/17 1052  NA 140  K 4.2  CL 104  CO2 26  BUN 16  CREATININE 0.67  GLUCOSE 98  CALCIUM 10.1   No results for input(s): AST, ALT, ALKPHOS in the last 168 hours.  Invalid input(s): TBILI   Recent Labs  Lab 12/04/17 1052  WBC 8.8  HGB 13.8  HCT 41.4  PLT 363   Recent Labs  Lab 12/04/17 1052  APTT 31  INR 0.96         Other tests/results: None  Assessment/Plan:  [name] is POD0 s/p L4-5 TLIF. She is recovering well, laying comfortably in bed. No lower extremity symptoms at this time.  - mobilize - pain control - DVT prophylaxis  [name] PA-C Department of Neurosurgery

## 2017-12-05 NOTE — Anesthesia Postprocedure Evaluation (Signed)
Anesthesia Post Note  Patient: Surveyor, mining  Procedure(s) Performed: TRANSFORAMINAL LUMBAR INTERBODY FUSION (TLIF) WITH PEDICLE SCREW FIXATION 1 LEVEL-L4-5 (N/A )  Patient location during evaluation: PACU Anesthesia Type: General Level of consciousness: awake and alert Pain management: pain level controlled Vital Signs Assessment: post-procedure vital signs reviewed and stable Respiratory status: spontaneous breathing, nonlabored ventilation, respiratory function stable and patient connected to nasal cannula oxygen Cardiovascular status: blood pressure returned to baseline and stable Postop Assessment: no apparent nausea or vomiting Anesthetic complications: no     Last Vitals:  Vitals:   12/05/17 1222 12/05/17 1235  BP: 127/87 125/64  Pulse: (!) 103 98  Resp: 13 18  Temp:  36.9 C  SpO2: 97% 95%    Last Pain:  Vitals:   12/05/17 1235  TempSrc:   PainSc: 4                  Caresse Sedivy K Kaio Kuhlman

## 2017-12-05 NOTE — H&P (Signed)
I have reviewed and confirmed my history and physical from 11/21/2017 with no additions or changes. Plan for L4-5 transforaminal interbody fusion.  Risks and benefits reviewed.    Heart sounds normal no MRG. Chest Clear to Auscultation Bilaterally.

## 2017-12-05 NOTE — Anesthesia Procedure Notes (Signed)
Procedure Name: Intubation Date/Time: 12/05/2017 7:41 AM Performed by: Johnna Acosta, CRNA Pre-anesthesia Checklist: Patient identified, Emergency Drugs available, Suction available, Patient being monitored and Timeout performed Patient Re-evaluated:Patient Re-evaluated prior to induction Oxygen Delivery Method: Circle system utilized Preoxygenation: Pre-oxygenation with 100% oxygen Induction Type: IV induction Ventilation: Mask ventilation without difficulty Laryngoscope Size: McGraph and 3 Grade View: Grade I Tube type: Oral Tube size: 7.0 mm Number of attempts: 1 Airway Equipment and Method: Stylet and Video-laryngoscopy Placement Confirmation: ETT inserted through vocal cords under direct vision,  positive ETCO2 and breath sounds checked- equal and bilateral Secured at: 21 cm Tube secured with: Tape Dental Injury: Teeth and Oropharynx as per pre-operative assessment  Difficulty Due To: Difficulty was anticipated, Difficult Airway- due to dentition and Difficult Airway- due to anterior larynx Future Recommendations: Recommend- induction with short-acting agent, and alternative techniques readily available

## 2017-12-05 NOTE — Progress Notes (Signed)
Patient was admitted to room 147 from OR. A&O x 4, but sleepy.  IV fluids running. JP drain to back. Two incisions to middle of lower back are intact. Bed alarm on for safety. Reviewed POC and orders. Allowed time for questions.

## 2017-12-05 NOTE — Op Note (Signed)
Indications: Mrs. Mentink is a 57 yo female with spondylolisthesis.  She failed conservative management and elected for operative intervention.  Findings: reduction of spondylolisthesis  Preoperative Diagnosis: Lumbar Stenosis with neurogenic claudication Postoperative Diagnosis: same   EBL: 50 ml IVF: 1000 ml Drains: 1 surgical drain Disposition: Extubated and Stable to PACU Complications: none  No foley catheter was placed.   Preoperative Note:   Risks of surgery discussed include: infection, bleeding, stroke, coma, death, paralysis, CSF leak, nerve/spinal cord injury, numbness, tingling, weakness, complex regional pain syndrome, recurrent stenosis and/or disc herniation, vascular injury, development of instability, neck/back pain, need for further surgery, persistent symptoms, development of deformity, and the risks of anesthesia. The patient understood these risks and agreed to proceed.  Operative Note:  1. Transforaminal Lumbar Interbody Fusion L4/5 2. Posterolateral arthrodesis L4 to L5 3. Posterior nonsegmental instrumentation L4 to L5 4. Lumbar decompression including central decompression, bilateral medial facetectomies, and bilateral foraminotomies at L4/5 5. Harvesting of autograft via the same incision 6. Placement of a biomechanical device (Nuvasive TLX) at L4/5 for anterior arthrodesis  The patient was brought to the Operating Room, intubated and turned into the prone position. All pressure points were checked and double checked. Flouroscopy was used to mark bilateral Wiltse incisions. The patient was prepped and draped in the standard fashion. A full timeout was performed. Preoperative antibiotics were given. The incisions were injected with local anesthetic. We started with the transforaminal interbody fusion from a right sided approach.  The right incision was opened with a scalped.  The fascia was opened 1 cm lateral to the midline.  The tubular retractor system was  advanced over the R L4-5 facet and locked into position.  The right L4/5 facet was removed with osteotomes and the drill, and handed off for preparation as autograft. The traversing and exiting nerve roots on the right were identified and protected. The disc was opened using a scalpel. After incising the disc space, we took a combination of pituitary rongeurs, Kerrison rongeurs, disc scrapers, and curettes to remove a majority of the disc material.  We prepared the end plates for accepting the interbody fusion.  We removed the cartilaginous plate, preserved the cortical endplate if possible during this procedure.  The disc space was irrigated. The Nuvasive TLX  TLIF biomechanical device (8-12 mm height x 11 mm width x 26 mm length, 15 degree lordotic) was inserted using flouroscopy, then backfilled with a mixture of allograft and autograft. During placement, the nerve roots and dural sac were carefully protected without any leaks identified.   After placement of the biomechanical device, additional compression of the neural elements was noted. To decompress the neural elements at L4/5, the drill was used to extend the laminoforaminotomy laterally until the traversing and exiting nerve roots were fully decompressed.  Additionally, the drill was used to removed the base of the spinous process and the contralateral lamina until the contralateral medial facet was identified and palpated. Using a [facility] and curettes, the ligamentum flavum at L4/5 was dissected completely free of the dura. Using punches and curettes, the ligamentum flavum was removed in its entirety at L4/5 for a complete decompression from facet to facet.  The contralateral L5 nerve root was palpated, and the Kerrison punch used to remove the remainder of the medial facet and foramen until the dural sac and nerve roots were complete decompressed.  At this point, the tubular retractor was removed.  Additional fascial incisions were made  on the right 3 cm  lateral to the midline.  The left incisions were opened for percutaneous pedicle screw placement.  Using AP flouroscopy, a Jamsheedi needle was placed at the center portion of the L4 pedicle radiographically. Triggered EMG was used during pedicle cannulation, without any readings below a 14 mAmp threshold during cannulation. The stylet was removed, and a K wire placed and secured.   We then moved to L5. Using AP flouroscopy, a Jamsheedi needle was placed at the center portion of the L5 pedicle radiographically. Triggered EMG was used during pedicle cannulation, without any readings below a 14 mAmp threshold during cannulation. The stylet was removed, and a K wire placed and secured.   After placement of all K wires, lateral flouroscopy was used to confirm placement in the pedicles. We then used a cannulated tap to tap each tract, and placed Nuvasive Reline pedicle screw shafts at each level (6.5 mm x 40 mm at L4, and 6.5 mm x 40 mm at L5).   The rods were measured and placed. The rods were secured using locking caps to manufacturer's specifications. Final AP and lateral radiographs were taken to confirm placement of instrumentation and appropriate alignment. The wound was copiously irrigated, then the external surfaces of the exposed lamina, facet, and transverse processes from L4 to L5 on the left were decorticated. A mixture of allograft and autograft was placed over the decorticated surfaces for arthrodesis.   A subfascial drain was placed on the right at the TLIF site.  After hemostasis, the wound was closed in layers with 0 and 2-0 vicryl. 3-0 monocryl and dermabond was applied to the incision. A sterile dressing was placed.  The patient was then flipped supine and extubated with incident. All counts were correct times 2 at the end of the case. No immediate complications were noted.   [name] PA assisted throughout the case.  Meade Maw MD

## 2017-12-05 NOTE — Anesthesia Preprocedure Evaluation (Signed)
Anesthesia Evaluation  Patient identified by MRN, date of birth, ID band Patient awake    Reviewed: Allergy & Precautions, H&P , NPO status , Patient's Chart, lab work & pertinent test results  History of Anesthesia Complications Negative for: history of anesthetic complications  Airway Mallampati: III  TM Distance: <3 FB Neck ROM: limited    Dental  (+) Chipped, Poor Dentition   Pulmonary neg pulmonary ROS, neg shortness of breath, asthma , sleep apnea , pneumonia, COPD, former smoker,           Cardiovascular Exercise Tolerance: Good hypertension, (-) angina(-) Past MI and (-) DOE      Neuro/Psych PSYCHIATRIC DISORDERS Depression Bipolar Disorder negative neurological ROS     GI/Hepatic Neg liver ROS, GERD  Medicated and Controlled,  Endo/Other  negative endocrine ROS  Renal/GU      Musculoskeletal  (+) Arthritis ,   Abdominal   Peds  Hematology negative hematology ROS (+)   Anesthesia Other Findings Past Medical History: No date: Arthritis     Comment:  hands No date: Asthma No date: Barrett's esophagus No date: Cancer [facility])     Comment:  neuroendocrine ca No date: COPD (chronic obstructive pulmonary disease) (HCC) No date: Depression No date: GERD (gastroesophageal reflux disease) No date: Hypertension No date: Motion sickness     Comment:  reading in car 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[place]) 2017: Pneumonia No date: Sleep apnea  Past Surgical History: No date: ABDOMINAL HYSTERECTOMY 07/19/2012: BOWEL RESECTION No date: CESAREAN SECTION     Comment:  x3 03/05/2017: ESOPHAGOGASTRODUODENOSCOPY (EGD) WITH PROPOFOL; N/A     Comment:  Procedure: ESOPHAGOGASTRODUODENOSCOPY (EGD) WITH               PROPOFOL;  Surgeon: [name], MD;  Location: [facility];  Service: Endoscopy;  Laterality: N/A; 03/30/2017: ESOPHAGOGASTRODUODENOSCOPY (EGD) WITH PROPOFOL; N/A     Comment:   REPEAT IN 03/2019 No date: HAND SURGERY; Right  BMI    Body Mass Index:  28.01 kg/m      Reproductive/Obstetrics negative OB ROS                             Anesthesia Physical Anesthesia Plan  ASA: III  Anesthesia Plan: General ETT   Post-op Pain Management:    Induction: Intravenous  PONV Risk Score and Plan:   Airway Management Planned: Oral ETT  Additional Equipment:   Intra-op Plan:   Post-operative Plan: Extubation in OR  Informed Consent: I have reviewed the patients History and Physical, chart, labs and discussed the procedure including the risks, benefits and alternatives for the proposed anesthesia with the patient or authorized representative who has indicated his/her understanding and acceptance.   Dental Advisory Given  Plan Discussed with: Anesthesiologist, CRNA and Surgeon  Anesthesia Plan Comments: (Patient consented for risks of anesthesia including but not limited to:  - adverse reactions to medications - damage to teeth, lips or other oral mucosa - sore throat or hoarseness - Damage to heart, brain, lungs or loss of life  Patient voiced understanding.)        Anesthesia Quick Evaluation

## 2017-12-05 NOTE — Transfer of Care (Signed)
Immediate Anesthesia Transfer of Care Note  Patient: Bailey Flores  Procedure(s) Performed: TRANSFORAMINAL LUMBAR INTERBODY FUSION (TLIF) WITH PEDICLE SCREW FIXATION 1 LEVEL-L4-5 (N/A )  Patient Location: PACU  Anesthesia Type:General  Level of Consciousness: awake, alert  and oriented  Airway & Oxygen Therapy: Patient Spontanous Breathing and Patient connected to face mask oxygen  Post-op Assessment: Report given to RN and Post -op Vital signs reviewed and stable  Post vital signs: Reviewed and stable  Last Vitals:  Vitals Value Taken Time  BP 150/97 12/05/2017 10:52 AM  Temp 36.9 C 12/05/2017 10:52 AM  Pulse 107 12/05/2017 10:51 AM  Resp 15 12/05/2017 10:51 AM  SpO2 99 % 12/05/2017 10:51 AM  Vitals shown include unvalidated device data.  Last Pain:  Vitals:   12/05/17 0622  TempSrc: Oral  PainSc: 0-No pain         Complications: No apparent anesthesia complications

## 2017-12-05 NOTE — Anesthesia Post-op Follow-up Note (Signed)
Anesthesia QCDR form completed.        

## 2017-12-06 DIAGNOSIS — Z8249 Family history of ischemic heart disease and other diseases of the circulatory system: Secondary | ICD-10-CM | POA: Diagnosis not present

## 2017-12-06 DIAGNOSIS — Z90710 Acquired absence of both cervix and uterus: Secondary | ICD-10-CM | POA: Diagnosis not present

## 2017-12-06 DIAGNOSIS — Z79899 Other long term (current) drug therapy: Secondary | ICD-10-CM | POA: Diagnosis not present

## 2017-12-06 DIAGNOSIS — K219 Gastro-esophageal reflux disease without esophagitis: Secondary | ICD-10-CM | POA: Diagnosis present

## 2017-12-06 DIAGNOSIS — Z8051 Family history of malignant neoplasm of kidney: Secondary | ICD-10-CM | POA: Diagnosis not present

## 2017-12-06 DIAGNOSIS — M5416 Radiculopathy, lumbar region: Secondary | ICD-10-CM | POA: Diagnosis present

## 2017-12-06 DIAGNOSIS — Z7951 Long term (current) use of inhaled steroids: Secondary | ICD-10-CM | POA: Diagnosis not present

## 2017-12-06 DIAGNOSIS — Z8589 Personal history of malignant neoplasm of other organs and systems: Secondary | ICD-10-CM | POA: Diagnosis not present

## 2017-12-06 DIAGNOSIS — Z800 Family history of malignant neoplasm of digestive organs: Secondary | ICD-10-CM | POA: Diagnosis not present

## 2017-12-06 DIAGNOSIS — Z818 Family history of other mental and behavioral disorders: Secondary | ICD-10-CM | POA: Diagnosis not present

## 2017-12-06 DIAGNOSIS — Z825 Family history of asthma and other chronic lower respiratory diseases: Secondary | ICD-10-CM | POA: Diagnosis not present

## 2017-12-06 DIAGNOSIS — G3184 Mild cognitive impairment, so stated: Secondary | ICD-10-CM | POA: Diagnosis present

## 2017-12-06 DIAGNOSIS — Z87891 Personal history of nicotine dependence: Secondary | ICD-10-CM | POA: Diagnosis not present

## 2017-12-06 DIAGNOSIS — F319 Bipolar disorder, unspecified: Secondary | ICD-10-CM | POA: Diagnosis present

## 2017-12-06 DIAGNOSIS — Z885 Allergy status to narcotic agent status: Secondary | ICD-10-CM | POA: Diagnosis not present

## 2017-12-06 DIAGNOSIS — F902 Attention-deficit hyperactivity disorder, combined type: Secondary | ICD-10-CM | POA: Diagnosis present

## 2017-12-06 DIAGNOSIS — J449 Chronic obstructive pulmonary disease, unspecified: Secondary | ICD-10-CM | POA: Diagnosis present

## 2017-12-06 DIAGNOSIS — Z9049 Acquired absence of other specified parts of digestive tract: Secondary | ICD-10-CM | POA: Diagnosis not present

## 2017-12-06 DIAGNOSIS — M4316 Spondylolisthesis, lumbar region: Secondary | ICD-10-CM | POA: Diagnosis present

## 2017-12-06 DIAGNOSIS — M48062 Spinal stenosis, lumbar region with neurogenic claudication: Secondary | ICD-10-CM | POA: Diagnosis present

## 2017-12-06 DIAGNOSIS — I10 Essential (primary) hypertension: Secondary | ICD-10-CM | POA: Diagnosis present

## 2017-12-06 DIAGNOSIS — Z833 Family history of diabetes mellitus: Secondary | ICD-10-CM | POA: Diagnosis not present

## 2017-12-06 MED ORDER — METHOCARBAMOL 1000 MG/10ML IJ SOLN
500.0000 mg | Freq: Four times a day (QID) | INTRAMUSCULAR | Status: DC
  Filled 2017-12-06: qty 5

## 2017-12-06 MED ORDER — KETOROLAC TROMETHAMINE 15 MG/ML IJ SOLN
15.0000 mg | Freq: Four times a day (QID) | INTRAMUSCULAR | Status: DC
  Administered 2017-12-06 – 2017-12-07 (×3): 15 mg via INTRAVENOUS
  Filled 2017-12-06 (×3): qty 1

## 2017-12-06 MED ORDER — METHOCARBAMOL 500 MG PO TABS
750.0000 mg | ORAL_TABLET | Freq: Four times a day (QID) | ORAL | Status: DC
  Administered 2017-12-06 – 2017-12-07 (×5): 750 mg via ORAL
  Filled 2017-12-06 (×5): qty 2

## 2017-12-06 MED ORDER — DIAZEPAM 5 MG PO TABS
5.0000 mg | ORAL_TABLET | Freq: Three times a day (TID) | ORAL | Status: DC | PRN
Start: 2017-12-06 — End: 2017-12-07

## 2017-12-06 MED ORDER — CELECOXIB 100 MG PO CAPS
100.0000 mg | ORAL_CAPSULE | Freq: Two times a day (BID) | ORAL | Status: DC
  Administered 2017-12-06: 100 mg via ORAL
  Filled 2017-12-06 (×2): qty 1

## 2017-12-06 NOTE — Progress Notes (Signed)
Rept given to [employer]. Pt sitting semi fowlers in bed visiting with husband at bedside. No s/sx of distress and no c/o such. Pt pain controlled. No change in patient from AM assessment.

## 2017-12-06 NOTE — Progress Notes (Signed)
Procedure: L4-5 TLIF Procedure Date: 12/05/2017 Diagnosis: Lumbar radiculopathy  History: Ketura Sirek is POD1 for L4/5 TLIF for lumbar radiculopathy. Did fairly well overnight, but back pain has increased to 8/10 since yesterday. Pain medication was just administered prior to evaluation. Also complains of new pain in right hip and lateral aspect of upper thigh. She has ambulated to the restroom without issue. Voiding and eating without issue.  Denies lower extremity pain (other than described), numbness, tingling, weakness.   Physical Exam: Vitals:   12/06/17 0313 12/06/17 0813  BP: 114/65 116/69  Pulse: 83 96  Resp: 20   Temp: 98.1 F (36.7 C) (!) 97.5 F (36.4 C)  SpO2: 94% 92%    AA Ox3 CNI Skin: honeycomb dressing present - clean and dry. Drain present.  Strength: 5/5 throughout lower extremities Sensation intact and symmetric lower extremities.   Data:  Recent Labs  Lab 12/04/17 1052  NA 140  K 4.2  CL 104  CO2 26  BUN 16  CREATININE 0.67  GLUCOSE 98  CALCIUM 10.1   No results for input(s): AST, ALT, ALKPHOS in the last 168 hours.  Invalid input(s): TBILI   Recent Labs  Lab 12/04/17 1052  WBC 8.8  HGB 13.8  HCT 41.4  PLT 363   Recent Labs  Lab 12/04/17 1052  APTT 31  INR 0.96         Other tests/results:   EXAM: LUMBAR SPINE - 2-3 VIEW 12/05/2017  COMPARISON:  None.  FINDINGS: Status post PLIF at L4-5.  Surgical hardware is in satisfactory position.  Associated surgical drain.  IMPRESSION: Status post PLIF at L4-5, without evidence of complication.  Assessment/Plan:  Lucretia Pendley is POD1 s/p L4-5 TLIF. She is doing fairly well, other than worsening pain since yesterday. Patient received pain medication prior to evaluation. Will increased methocarbamol to 750mg  TID and include celebrex in pain management regimen.   - mobilize - pain control - DVT prophylaxis  [name] PA-C Department of Neurosurgery

## 2017-12-06 NOTE — Evaluation (Signed)
Physical Therapy Evaluation Patient Details Name: Cloey Sferrazza MRN: [id number] DOB: 07/14/1960 Today's Date: 12/06/2017   History of Present Illness  57yo female pt POD1 for L4/5 TLIF for lumbar radiculopathy at time of PT evaluation.  Clinical Impression  Pt is a pleasant 57 year old female who was admitted for a L4/L5 TLIF by Dr. Cari Caraway on 12/06/17. Pt performs bed mobility, transfers, and ambulation with CGA. Pt demonstrates deficits with strength, endurance and pain. Pt amb 100' during session after adjusting RW with verbal cuing throughout to increase gait efficiency. Pt able to recite low back precautions at beginning and end of session. Pt demonstrates WNL strength of both upper and lower extremities. Pt declines any numbness or tingling in lower extremities. Pt at this time is drastically limited due to pain stating that it is a 9/10 and is never better than an 8/10. Pts husband present during session and verbalizes ability to assist pt in the home. PT plans to practice stairs next visit. Pt could benefit from continued skilled therapy at this time to improve deficits toward PLOF. PT will continue to work with pt 7x/week while admitted. D/c recommendations at this time are home with home health PT.      Follow Up Recommendations Home health PT    Equipment Recommendations  Rolling walker with 5" wheels(pediatric)    Recommendations for Other Services       Precautions / Restrictions Precautions Precautions: Back Precaution Booklet Issued: Yes (comment) Precaution Comments: no bending, arching, twisting, lifting; no brace Restrictions Weight Bearing Restrictions: No      Mobility  Bed Mobility Overal bed mobility: Modified Independent Bed Mobility: Sidelying to Sit;Sit to Sidelying;Rolling Rolling: Supervision Sidelying to sit: Supervision     Sit to sidelying: Supervision General bed mobility comments: pt performs bed mobility with close supervision. Pt verbalizes  and demonstrates safety with log rolling to not break precautions  Transfers Overall transfer level: Needs assistance Equipment used: Rolling walker (2 wheeled) Transfers: Sit to/from Stand Sit to Stand: Min guard         General transfer comment: CGA to RW for sit to stand transfers with safe techniques to maintain back precautions  Ambulation/Gait Ambulation/Gait assistance: Min guard Gait Distance (Feet): 100 Feet Assistive device: Rolling walker (2 wheeled) Gait Pattern/deviations: Shuffle     General Gait Details: pt amb with RW, CGA and chair follow, pt demonstrates slow shuffling gait with appropriate use of RW. Pt demonstrates very short steps, when cued to increase stride length pt states this increases her pain. No gross LOB. pt request to sit due to pain after 100'  Stairs            Wheelchair Mobility    Modified Rankin (Stroke Patients Only)       Balance Overall balance assessment: Needs assistance   Sitting balance-Leahy Scale: Good Sitting balance - Comments: sits EOB with B feet on floor and no UE support needed, no gross LOB     Standing balance-Leahy Scale: Good Standing balance comment: able to stand without UE support and narrow BOS without LOB. pt chooses to use RW during gait due to decreased pain when using                             Pertinent Vitals/Pain Pain Assessment: 0-10 Pain Score: 9  Pain Location: low back, lateral/posterior aspects of R hip Pain Descriptors / Indicators: Aching;Grimacing;Guarding Pain Intervention(s): Limited activity within patient's tolerance;Monitored  during session;Repositioned    Home Living Family/patient expects to be discharged to:: Private residence Living Arrangements: Spouse/significant other;Children Available Help at Discharge: Family;Available PRN/intermittently;Available 24 hours/day Type of Home: House Home Access: Stairs to enter Entrance Stairs-Rails: Counsellor of Steps: 3 Home Layout: One level Home Equipment: Cane - single point;Crutches;Shower seat;Walker - 4 wheels;Cane - quad;Adaptive equipment      Prior Function Level of Independence: Independent         Comments: pt independent and active, no falls, occasionally using hurry cane to support toilet transfers 2/2 lumbar back pain. Enjoys gardening.     Hand Dominance        Extremity/Trunk Assessment   Upper Extremity Assessment Upper Extremity Assessment: Overall WFL for tasks assessed    Lower Extremity Assessment Lower Extremity Assessment: Overall WFL for tasks assessed(Grossly at least 4/5 bilaterally)       Communication   Communication: No difficulties  Cognition Arousal/Alertness: Awake/alert Behavior During Therapy: WFL for tasks assessed/performed Overall Cognitive Status: Within Functional Limits for tasks assessed                                        General Comments      Exercises Other Exercises Other Exercises: pt instructed in gait training after adjusting RW including verbal cuing and education on safe technique throughout Other Exercises: pt/spouse instructed in car transfers with visual demo and verbal instruction   Assessment/Plan    PT Assessment Patient needs continued PT services  PT Problem List Decreased strength;Decreased range of motion;Decreased activity tolerance;Decreased balance;Decreased mobility;Decreased coordination;Decreased cognition;Decreased knowledge of use of DME;Decreased safety awareness;Decreased knowledge of precautions       PT Treatment Interventions DME instruction;Gait training;Stair training;Functional mobility training;Therapeutic exercise;Therapeutic activities;Balance training;Neuromuscular re-education;Patient/family education    PT Goals (Current goals can be found in the Care Plan section)  Acute Rehab PT Goals Patient Stated Goal: to get back to PLOF, do zoomba     Frequency 7X/week   Barriers to discharge        Co-evaluation               AM-PAC PT "6 Clicks" Daily Activity  Outcome Measure Difficulty turning over in bed (including adjusting bedclothes, sheets and blankets)?: A Little Difficulty moving from lying on back to sitting on the side of the bed? : A Little Difficulty sitting down on and standing up from a chair with arms (e.g., wheelchair, bedside commode, etc,.)?: Unable Help needed moving to and from a bed to chair (including a wheelchair)?: A Little Help needed walking in hospital room?: A Little Help needed climbing 3-5 steps with a railing? : A Lot 6 Click Score: 15    End of Session Equipment Utilized During Treatment: Gait belt Activity Tolerance: Patient limited by pain Patient left: in chair;with call bell/phone within reach;with chair alarm set;with family/visitor present Nurse Communication: Mobility status PT Visit Diagnosis: Unsteadiness on feet (R26.81);Other abnormalities of gait and mobility (R26.89);Muscle weakness (generalized) (M62.81);Difficulty in walking, not elsewhere classified (R26.2);Pain Pain - Right/Left: Right Pain - part of body: (low back)    Time: 8338-2505 PT Time Calculation (min) (ACUTE ONLY): 36 min   Charges:         PT G Codes:        Sarkis Rhines, SPT   Sherril Heyward 12/06/2017, 5:28 PM

## 2017-12-06 NOTE — Evaluation (Signed)
Occupational Therapy Evaluation Patient Details Name: Bailey Flores MRN: [id number] DOB: December 21, 1960 Today's Date: 12/06/2017    History of Present Illness 57yo female pt POD1 for L4/5 TLIF for lumbar radiculopathy at time of OT evaluation.   Clinical Impression   Pt seen for OT evaluation this date. Prior to hospital admission, pt was independent with mobility, ADL, and IADL, however using a hurry cane for toilet transfers in the morning if she was having significant lumbar back pain. No falls in past 12 months. Pt has been having increased difficulty with mobility and ADL due to back pain. Pt lives with spouse, daughter, and son in a single family home (rental) with 3 steps to enter and R and L handrails with spouse able to provide 24/7 assist/support as needed for pt. Pt instructed in back precautions (no bending, arching, twisting, lifting) and how to implement during bed mobility, transfers, bathing, dressing, and grooming tasks as well as home/routines modifications and car transfers. Pt also instructed in falls prevention strategies, positioning for sleep, posture and sitting surfaces that provide optimal support, and compression stockings mgt. Pt able to perform log roll and sidelying<>sit with supervision after initial instruction, CGA and PRN handheld assist +1 for ambulating in room, min assist for LB ADL tasks (more assist for socks/shoes), and max assist from family for compression stockings. Pt verbalized understanding of all education/training provided. Handout provided to support recall and carry over of learned precautions/techniques for bed mobility, functional transfers, and self care skills. Pt experiencing 7-8/10 back t/o session during mobility and ADL tasks. RN in room at end of session to provide medications including meds to support pain mgt. Pt will benefit from additional skilled OT services while in the hospital to address noted impairments and functional deficits in order to  maximize safety and functional independence while minimizing falls risk and maintaining precautions. Will plan to follow up this afternoon for additional treatment session with spouse present to provide additional training/education on precautions and self care skills. Anticipate pt's performance with mobility and ADL will improve with better pain control. Upon hospital discharge, recommend pt return home with family support.      Follow Up Recommendations  No OT follow up    Equipment Recommendations  Other (comment)(consider sock aid)    Recommendations for Other Services       Precautions / Restrictions Precautions Precautions: Back Precaution Booklet Issued: Yes (comment) Precaution Comments: no bending, arching, twisting, lifting; no brace Restrictions Weight Bearing Restrictions: No      Mobility Bed Mobility Overal bed mobility: Needs Assistance Bed Mobility: Sidelying to Sit;Sit to Sidelying;Rolling Rolling: Supervision Sidelying to sit: Min guard     Sit to sidelying: Min guard General bed mobility comments: instructed in log roll and sidelying techniques to maintain back precautions while performing bed mobility, pt able to return demo with CGA  Transfers Overall transfer level: Needs assistance Equipment used: 1 person hand held assist;None Transfers: Sit to/from Stand Sit to Stand: Min guard         General transfer comment: CGA to HHA for sit to stand transfers with good carryover of learned techniques to maintain back precautions    Balance Overall balance assessment: Mild deficits observed, not formally tested                                         ADL either performed or assessed with clinical  judgement   ADL Overall ADL's : Needs assistance/impaired Eating/Feeding: Sitting;Independent   Grooming: Standing;Supervision/safety;Oral care;Wash/dry face;Wash/dry hands Grooming Details (indicate cue type and reason): standing at sink,  instructed in techniques to minimize bending with good carryover Upper Body Bathing: Sitting;Modified independent Upper Body Bathing Details (indicate cue type and reason): instructed in techniques to maintain back precautions Lower Body Bathing: Sit to/from stand;Supervison/ safety;Sitting/lateral leans Lower Body Bathing Details (indicate cue type and reason): instructed in techniques to maintain back precautions, encouraged use of LH sponge Upper Body Dressing : Sitting;Modified independent Upper Body Dressing Details (indicate cue type and reason): uses modified techniques for 2/2 R shoulder arthritis Lower Body Dressing: Sit to/from stand;Min guard;With adaptive equipment;Cueing for compensatory techniques;Cueing for back precautions Lower Body Dressing Details (indicate cue type and reason): instructed in techniques to maintain back precautions including AE; instructed in compression stocking mgt which pt will require max assist for Toilet Transfer: Regular Toilet;Min guard Toilet Transfer Details (indicate cue type and reason): with HHA, and utilizing learned techniques to maintain back precautions Toileting- Clothing Manipulation and Hygiene: Modified independent;Sit to/from stand;Adhering to back precautions Toileting - Clothing Manipulation Details (indicate cue type and reason): instructed in strategies to support clothing mgt and maintain back precautions     Functional mobility during ADLs: Min guard(handheld assist)       Vision Baseline Vision/History: Wears glasses Wears Glasses: At all times Patient Visual Report: No change from baseline       Perception     Praxis      Pertinent Vitals/Pain Pain Assessment: 0-10 Pain Score: 8  Pain Location: low back, lateral/posterior aspects of R hip Pain Descriptors / Indicators: Aching;Grimacing;Guarding Pain Intervention(s): Limited activity within patient's tolerance;Repositioned;Monitored during session;Premedicated before  session;RN gave pain meds during session     Hand Dominance Right   Extremity/Trunk Assessment Upper Extremity Assessment Upper Extremity Assessment: Overall WFL for tasks assessed(hx R shoulder arthritis, but overall WFL)   Lower Extremity Assessment Lower Extremity Assessment: Overall WFL for tasks assessed   Cervical / Trunk Assessment Cervical / Trunk Assessment: Normal   Communication Communication Communication: No difficulties   Cognition Arousal/Alertness: Awake/alert Behavior During Therapy: WFL for tasks assessed/performed Overall Cognitive Status: Within Functional Limits for tasks assessed                                     General Comments  drain in place t/o session    Exercises Other Exercises Other Exercises: pt instructed in back precautions handout which included strategies for bed mobility, sleeping positioning, bathing, dressing, grooming, accessing her environment, and precautions   Shoulder Instructions      Home Living Family/patient expects to be discharged to:: Private residence Living Arrangements: Spouse/significant other;Children Available Help at Discharge: Family;Available PRN/intermittently;Available 24 hours/day(spouse and dtr 24/7, son PRN) Type of Home: House(rental) Home Access: Stairs to enter Entrance Stairs-Number of Steps: 3 Entrance Stairs-Rails: Right;Left(may be able to reach both) Home Layout: One level     Bathroom Shower/Tub: Occupational psychologist: Handicapped height     Home Equipment: [place] - single point;Crutches;Shower seat;Walker - 4 wheels;Cane - quad;Adaptive equipment Adaptive Equipment: Reacher        Prior Functioning/Environment Level of Independence: Independent        Comments: pt independent and active, no falls, occasionally using hurry cane to support toilet transfers 2/2 lumbar back pain. Enjoys gardening.  OT Problem List: Impaired balance (sitting and/or  standing);Decreased knowledge of precautions;Pain;Decreased knowledge of use of DME or AE      OT Treatment/Interventions: Self-care/ADL training;Balance training;DME and/or AE instruction;Therapeutic activities;Patient/family education    OT Goals(Current goals can be found in the care plan section) Acute Rehab OT Goals Patient Stated Goal: to learn my precautions and go home to recover OT Goal Formulation: With patient Time For Goal Achievement: 12/20/17 Potential to Achieve Goals: Good ADL Goals Pt Will Perform Lower Body Dressing: with set-up;with adaptive equipment;sit to/from stand(using learned techniques to maintain back precautions) Pt Will Transfer to Toilet: with supervision;ambulating;regular height toilet(using learned techniques to maintain back precautions) Additional ADL Goal #1: Pt/spouse will demo understanding of compression stocking mgt/positioning/donning/doffing Additional ADL Goal #2: Pt will independently verbalize 100% of back precautions to maximize safety during mobility and ADL tasks.  OT Frequency: Min 1X/week   Barriers to D/C:            Co-evaluation              AM-PAC PT "6 Clicks" Daily Activity     Outcome Measure Help from another person eating meals?: None Help from another person taking care of personal grooming?: None Help from another person toileting, which includes using toliet, bedpan, or urinal?: A Little Help from another person bathing (including washing, rinsing, drying)?: A Little Help from another person to put on and taking off regular upper body clothing?: None Help from another person to put on and taking off regular lower body clothing?: A Little 6 Click Score: 21   End of Session Equipment Utilized During Treatment: Gait belt  Activity Tolerance: Patient tolerated treatment well;Patient limited by pain(slightly limited by pain) Patient left: in bed;with call bell/phone within reach;with bed alarm set;with nursing/sitter  in room  OT Visit Diagnosis: Unsteadiness on feet (R26.81);Pain Pain - Right/Left: Right Pain - part of body: Hip(and low back)                Time: 0354-6568 OT Time Calculation (min): 67 min Charges:  OT General Charges $OT Visit: 1 Visit OT Evaluation $OT Eval Low Complexity: 1 Low OT Treatments $Self Care/Home Management : 38-52 mins  Jeni Salles, MPH, MS, OTR/L ascom [phone] 12/06/17, 10:17 AM

## 2017-12-06 NOTE — Progress Notes (Signed)
Occupational Therapy Treatment Patient Details Name: Bailey Flores MRN: [id number] DOB: February 20, 1961 Today's Date: 12/06/2017    History of present illness 57yo female pt POD1 for L4/5 TLIF for lumbar radiculopathy at time of OT evaluation.   OT comments  Pt seen for OT tx this afternoon to support recall and carryover of learned back precautions and techniques and self care skills to maintain back precautions during bed mobility, functional transfers, ADL mobility, and self care tasks. Spouse present for session. Pt/spouse instructed in back precautions, home/routines modifications, falls prevention, compression stocking mgt with verbal instruction and visual demo while maintaining precautions, and car transfer techniques. Pt reported using RW with nursing to go to the bathroom prior to OT tx session and stated she felt more stable than with previous HHA from therapist this morning. Pt endorses reaching out for furniture or walls prior to admission with some instability/impaired balance. Pain still limiting factor, 7-8/10 t/o session at rest and during activity. Notified RN of recommendation for PT evaluation to assess mobility and most appropriate AD (if any) to use at home as well as for stair training to support maximally safe return home with spouse/family support. Will continue to progress towards OT goals while here. With improved pain control, do not anticipate follow up skilled OT needs.   Follow Up Recommendations  No OT follow up    Equipment Recommendations       Recommendations for Other Services PT consult    Precautions / Restrictions Precautions Precautions: Back Precaution Booklet Issued: Yes (comment) Precaution Comments: no bending, arching, twisting, lifting; no brace Restrictions Weight Bearing Restrictions: No       Mobility Bed Mobility Overal bed mobility: Needs Assistance Bed Mobility: Sidelying to Sit;Sit to Sidelying;Rolling Rolling: Supervision Sidelying to  sit: Min guard     Sit to sidelying: Min guard General bed mobility comments: pt/spouse instructed in log roll and sidelying techniques for bed mobility while maintaining precautions  Transfers Overall transfer level: Needs assistance Equipment used: 1 person hand held assist;None Transfers: Sit to/from Stand Sit to Stand: Min guard              Balance Overall balance assessment: Mild deficits observed, not formally tested                                         ADL either performed or assessed with clinical judgement   ADL Overall ADL's : Needs assistance/impaired                       Lower Body Dressing Details (indicate cue type and reason): pt/spouse instructed in compression stocking mgt with verbal instruction and visual demo while maintaining precautions                     Vision Baseline Vision/History: Wears glasses Wears Glasses: At all times Patient Visual Report: No change from baseline     Perception     Praxis      Cognition Arousal/Alertness: Awake/alert Behavior During Therapy: WFL for tasks assessed/performed Overall Cognitive Status: Within Functional Limits for tasks assessed                                          Exercises Other Exercises Other Exercises: pt/spouse instructed in  back precautions handout which included strategies for bed mobility, sleeping positioning, bathing, dressing, grooming, accessing her environment, and precautions Other Exercises: pt/spouse instructed in car transfers with visual demo and verbal instruction   Shoulder Instructions       General Comments      Pertinent Vitals/ Pain       Pain Assessment: 0-10 Pain Score: 8  Pain Location: low back, lateral/posterior aspects of R hip Pain Descriptors / Indicators: Aching;Grimacing;Guarding Pain Intervention(s): Limited activity within patient's tolerance;Monitored during session;Repositioned  Home Living                                           Prior Functioning/Environment              Frequency  Min 1X/week        Progress Toward Goals  OT Goals(current goals can now be found in the care plan section)  Progress towards OT goals: OT to reassess next treatment  Acute Rehab OT Goals Patient Stated Goal: to learn my precautions and go home to recover OT Goal Formulation: With patient Time For Goal Achievement: 12/20/17 Potential to Achieve Goals: Good  Plan Discharge plan remains appropriate;Frequency remains appropriate    Co-evaluation                 AM-PAC PT "6 Clicks" Daily Activity     Outcome Measure   Help from another person eating meals?: None Help from another person taking care of personal grooming?: None Help from another person toileting, which includes using toliet, bedpan, or urinal?: A Little Help from another person bathing (including washing, rinsing, drying)?: A Little Help from another person to put on and taking off regular upper body clothing?: None Help from another person to put on and taking off regular lower body clothing?: A Little 6 Click Score: 21    End of Session Equipment Utilized During Treatment: Gait belt  OT Visit Diagnosis: Unsteadiness on feet (R26.81);Pain Pain - Right/Left: Right Pain - part of body: Hip(and low back)   Activity Tolerance Patient tolerated treatment well;Patient limited by pain(still having 7-8/10 pain at rest and with mobility)   Patient Left in bed;with call bell/phone within reach;with bed alarm set;with family/visitor present   Nurse Communication Other (comment)(need for PT evaluation for mobility)        Time: 1411-1430 OT Time Calculation (min): 19 min  Charges: OT General Charges $OT Visit: 1 Visit OT Treatments $Self Care/Home Management : 8-22 mins  Jeni Salles, MPH, MS, OTR/L ascom [phone] 12/06/17, 2:59 PM

## 2017-12-07 MED ORDER — OXYCODONE HCL 5 MG PO TABS: 5.0000 mg | ORAL_TABLET | ORAL | 0 refills | Status: DC | PRN

## 2017-12-07 MED ORDER — METHOCARBAMOL 750 MG PO TABS: 750.0000 mg | ORAL_TABLET | Freq: Four times a day (QID) | ORAL | Status: DC

## 2017-12-07 MED ORDER — CELECOXIB 100 MG PO CAPS: 100.0000 mg | ORAL_CAPSULE | Freq: Two times a day (BID) | ORAL | 0 refills | Status: DC

## 2017-12-07 NOTE — Discharge Instructions (Signed)
No lifting greater than 10 pounds for the next 6 weeks, which is equal to a gallon of milk.  The following are instructions to help in your recovery once you have been discharged from the hospital. Even if you feel well, it is important that you follow these activity guidelines. If you do not let your back heal properly from the surgery, you can increase the chance of return of your symptoms and other complications.  * Do not take anti-inflammatory medications for 3 months after surgery (naproxen [Aleve], ibuprofen [Advil, Motrin], etc.). These medications can prevent your bones from healing properly.  Activity    No bending, lifting, or twisting (BLT). Avoid lifting objects heavier than 10 pounds (gallon milk jug).  Where possible, avoid household activities that involve lifting, bending, reaching, pushing, or pulling such as laundry, vacuuming, grocery shopping, and childcare. Try to arrange for help from friends and family for these activities while your back heals.  Increase physical activity slowly as tolerated.  Taking short walks is encouraged, but avoid strenuous exercise. Do not jog, run, bicycle, lift weights, or participate in any other exercises unless specifically allowed by your doctor.  Talk to your doctor before resuming sexual activity.  You should not drive until cleared by your doctor.  Until released by your doctor, you should not return to work or school.  You should rest at home and let your body heal.   You may shower three days after your surgery.  After showering, lightly dab your incision dry. Do not take a tub bath or go swimming until approved by your doctor at your follow-up appointment.  If you smoke, we strongly recommend that you quit.  Smoking has been proven to interfere with normal bone healing and will dramatically reduce the success rate of your surgery. Please contact [facility] (800-QUIT-NOW) and use the resources at www.[facility].com for assistance in  stopping smoking.  Surgical Incision   If you have a dressing on your incision, you may remove it two days after your surgery. Keep your incision area clean and dry.  If you have staples or stitches on your incision, you should have a follow up scheduled for removal. If you do not have staples or stitches, you will have steri-strips (small pieces of surgical tape) or Dermabond glue. The steri-strips/glue should begin to peel away within about a week (it is fine if the steri-strips fall off before then). If the strips are still in place one week after your surgery, you may gently remove them.  Diet           You may return to your usual diet. However, you may experience discomfort when swallowing in the first month after your surgery. This is normal. You may find that softer foods are more comfortable for you to swallow. Be sure to stay hydrated.  When to Contact us  You may experience pain in your neck and/or pain between your shoulder blades. This is normal and should improve in the next few weeks with the help of pain medication, muscle relaxers, and rest. Some patients report that a warm compress on the back of the neck or between the shoulder blades helps.  However, should you experience any of the following, contact us immediately:  New numbness or weakness  Pain that is progressively getting worse, and is not relieved by your pain medication, muscle relaxers, rest, and warm compresses  Bleeding, redness, swelling, pain, or drainage from surgical incision  Chills or flu-like symptoms  Fever greater  than 101.0 F (38.3 C)  Inability to eat, drink fluids, or take medications  Problems with bowel or bladder functions  Difficulty breathing or shortness of breath  Warmth, tenderness, or swelling in your calf   No repetitive bending, twisting, or jumping.  No strenuous physical activity.  Avoid situations that put you at high risk of injury/falling ex: unstable terrain, ice You  should avoid NSAIDS such as aspirin, ibuprofen, naproxen for 12 weeks after surgery as these can interfere with your fusion. Continue to take stool softeners and laxatives as needed while on narcotics. Remove dressing on your second postoperative day. Ok to shower on your third postoperative day. It is ok to get your incision wet. Do not scrub incision. No soaking in tubs or pools for 4 weeks post op or until incision is completely healed. You cannot drive until seen in follow up.  Call if you have any concerns or questions, or if you develop any redness, swelling or drainage from your incision; during normal business hours call [phone] and ask for Ophelia Shoulder; after business hours call [phone] and ask to speak with the neurosurgeon on call.

## 2017-12-07 NOTE — Discharge Summary (Signed)
Procedure: L4-5 TLIF Procedure Date: 12/05/2017 Diagnosis: Lumbar radiculopathy  History: Bailey Flores is POD2 for L4/5 TLIF for lumbar radiculopathy. Tolerated procedure well and is recovering as expected. On POD1 pain became intolerable and medication adjustments were made. Today she is doing much better, pain currently rated 6/10, sitting comfortably in chair and reading. She walked around yesterday without issue. Eating and voiding without issue.  Right leg pain that she was feeling yesterday is also subsiding.  Pain that was present prior to surgery remains resolved. No numbness/tingling.   Physical Exam: Vitals:   12/07/17 0401 12/07/17 0758  BP: 113/69 114/68  Pulse: 89 90  Resp: 16   Temp: 98.9 F (37.2 C) 98.6 F (37 C)  SpO2: 94% 93%    AA Ox3 CNI Skin: honeycomb dressing present - clean and dry. Drain site dressing also clean and dry. No edema, drainage, bleeding.  Strength: 5/5 throughout lower extremities Sensation intact and symmetric lower extremities.   Data:  Recent Labs  Lab 12/04/17 1052  NA 140  K 4.2  CL 104  CO2 26  BUN 16  CREATININE 0.67  GLUCOSE 98  CALCIUM 10.1   No results for input(s): AST, ALT, ALKPHOS in the last 168 hours.  Invalid input(s): TBILI   Recent Labs  Lab 12/04/17 1052  WBC 8.8  HGB 13.8  HCT 41.4  PLT 363   Recent Labs  Lab 12/04/17 1052  APTT 31  INR 0.96         Other tests/results:   EXAM: LUMBAR SPINE - 2-3 VIEW 12/05/2017  COMPARISON:  None.  FINDINGS: Status post PLIF at L4-5.  Surgical hardware is in satisfactory position.  Associated surgical drain.  IMPRESSION: Status post PLIF at L4-5, without evidence of complication.  Assessment/Plan:  Bailey Flores is recovering well following L4-5 TLIF. Symptoms prior to surgery have resolved and pain is adequately controlled and seems to be improving.  Able to ambulate independently, void, and eat without issue.  Will continue pain  control with Celebrex, robaxin, tylenol, and oxycodone.  Discussed wound care and physical activity restrictions.  2 week follow up in clinic already scheduled. Patient advised to contact office if any questions or concerns arise before then.    [name] PA-C Department of Neurosurgery

## 2017-12-07 NOTE — Progress Notes (Signed)
VSS. Pt in no acute distress. RN explained discharge instructions to pt and husband. Pt verbalized understanding. Pt wheeled to visitors entrance and assisted into vehicle.

## 2017-12-07 NOTE — Progress Notes (Signed)
Physical Therapy Treatment Patient Details Name: Bailey Flores MRN: [id number] DOB: 04-03-1961 Today's Date: 12/07/2017    History of Present Illness 57yo female pt POD1 for L4/5 TLIF for lumbar radiculopathy at time of PT evaluation.    PT Comments    Pt seen this morning and upon arrival is up standing with PA in room. Pt states that she is doing very well and believes that she has really turned the corner. Pt states that she has 6/10 pain at the moment but does not display any outward signs of pain. Pt amb 150' with close supervision and no AD. Pt instructed in stair training and ascends and descends 4 stairs 2x each with verbal cuing to increase safety. Pt able to perform safely and verbalizes safety entering and exiting home. Pt states that she would prefer to go to outpatient PT opposed to HHPT and demonstrates safety to perform so. Pt no longer needs RW at this time. Case manager made aware of changes to d/c plan. Pt could benefit from continued skilled therapy at this time to improve deficits toward PLOF. PT will continue to work with pt 7x/week while admitted. D/c recommendations at this time are outpatient PT.   Follow Up Recommendations  Outpatient PT     Equipment Recommendations  None recommended by PT    Recommendations for Other Services       Precautions / Restrictions Precautions Precautions: Back Precaution Booklet Issued: Yes (comment) Precaution Comments: no bending, arching, twisting, lifting; no brace Restrictions Weight Bearing Restrictions: No    Mobility  Bed Mobility               General bed mobility comments: Not assessed this visit. pt up standing upon arrival and prefers to stay up in chair at completion  Transfers Overall transfer level: Needs assistance Equipment used: None Transfers: Sit to/from Stand Sit to Stand: Supervision         General transfer comment: Close supervision stand>sit. Performs safely without verbal cuing and  appropriate use of UEs  Ambulation/Gait Ambulation/Gait assistance: Supervision Gait Distance (Feet): 150 Feet Assistive device: None Gait Pattern/deviations: WFL(Within Functional Limits);Step-through pattern     General Gait Details: pt amb 150' with close supervision and no AD. pt demonstrates much improved gait pattern and cadence. No gross LOB throughout   Stairs Stairs: Yes Stairs assistance: Min guard Stair Management: One rail Right;One rail Left Number of Stairs: 8 General stair comments: pt ascended and descended 4 stairs x2. Pt demonstrates ability to perform safely with CGA and no LOB. Pt given verbal cuing to perform with step to gait instead of step over step due to increased pain with step over step.   Wheelchair Mobility    Modified Rankin (Stroke Patients Only)       Balance                                            Cognition Arousal/Alertness: Awake/alert Behavior During Therapy: WFL for tasks assessed/performed Overall Cognitive Status: Within Functional Limits for tasks assessed                                        Exercises Other Exercises Other Exercises: pt instructed in stair training with verbal cuing for safe technique and appropriate gait. Pt tolerated  well with no LOB. ascended and descended 4 stairs 2x each    General Comments        Pertinent Vitals/Pain Pain Assessment: 0-10 Pain Score: 6  Pain Location: low back, lateral/posterior aspects of R hip Pain Descriptors / Indicators: Aching;Grimacing;Guarding Pain Intervention(s): Limited activity within patient's tolerance;Monitored during session    Home Living                      Prior Function            PT Goals (current goals can now be found in the care plan section) Acute Rehab PT Goals Patient Stated Goal: to get back to PLOF, do zoomba Progress towards PT goals: Progressing toward goals    Frequency    7X/week       PT Plan Discharge plan needs to be updated    Co-evaluation              AM-PAC PT "6 Clicks" Daily Activity  Outcome Measure  Difficulty turning over in bed (including adjusting bedclothes, sheets and blankets)?: A Little Difficulty moving from lying on back to sitting on the side of the bed? : A Little Difficulty sitting down on and standing up from a chair with arms (e.g., wheelchair, bedside commode, etc,.)?: A Little Help needed moving to and from a bed to chair (including a wheelchair)?: None Help needed walking in hospital room?: None Help needed climbing 3-5 steps with a railing? : None 6 Click Score: 21    End of Session Equipment Utilized During Treatment: Gait belt Activity Tolerance: Patient limited by pain Patient left: in chair;with call bell/phone within reach;with chair alarm set Nurse Communication: Mobility status;Other (comment)(d/c recommendation) PT Visit Diagnosis: Unsteadiness on feet (R26.81);Other abnormalities of gait and mobility (R26.89);Muscle weakness (generalized) (M62.81);Difficulty in walking, not elsewhere classified (R26.2);Pain Pain - Right/Left: Right Pain - part of body: (low back and lateral hip)     Time: 7591-6384 PT Time Calculation (min) (ACUTE ONLY): 15 min  Charges:                       G Codes:      [place] Marylu Lund, SPT    Lennell Shanks 12/07/2017, 9:41 AM

## 2017-12-07 NOTE — Care Management (Addendum)
Patient for discharge home today.  Obtained order for pediatric walker from [name] PA.  Was informed that patient had a referral to [facility] for home health physical therapy prior to surgery.  CM notified [facility] of the discharge and confirmed agency had this referral made pre operatively. Updated primary nurse that a walker to be delivered prior to discharge  Physical therapist updated CM that patient no longer needs pediatric rolling walker.  Notified [facility] and primary nurse.

## 2017-12-09 ENCOUNTER — Encounter: Payer: Self-pay | Admitting: Emergency Medicine

## 2017-12-09 ENCOUNTER — Inpatient Hospital Stay
Admission: EM | Admit: 2017-12-09 | Discharge: 2017-12-11 | DRG: 863 | Disposition: A | Discharge status: Home Health | Attending: Internal Medicine | Admitting: Internal Medicine

## 2017-12-09 ENCOUNTER — Other Ambulatory Visit: Payer: Self-pay

## 2017-12-09 ENCOUNTER — Emergency Department

## 2017-12-09 DIAGNOSIS — T8144XA Sepsis following a procedure, initial encounter: Secondary | ICD-10-CM | POA: Diagnosis present

## 2017-12-09 DIAGNOSIS — Z87891 Personal history of nicotine dependence: Secondary | ICD-10-CM | POA: Diagnosis not present

## 2017-12-09 DIAGNOSIS — Y848 Other medical procedures as the cause of abnormal reaction of the patient, or of later complication, without mention of misadventure at the time of the procedure: Secondary | ICD-10-CM | POA: Diagnosis not present

## 2017-12-09 DIAGNOSIS — K219 Gastro-esophageal reflux disease without esophagitis: Secondary | ICD-10-CM | POA: Diagnosis not present

## 2017-12-09 DIAGNOSIS — G4730 Sleep apnea, unspecified: Secondary | ICD-10-CM | POA: Diagnosis not present

## 2017-12-09 DIAGNOSIS — I10 Essential (primary) hypertension: Secondary | ICD-10-CM | POA: Diagnosis not present

## 2017-12-09 DIAGNOSIS — A419 Sepsis, unspecified organism: Secondary | ICD-10-CM | POA: Diagnosis present

## 2017-12-09 DIAGNOSIS — Z90710 Acquired absence of both cervix and uterus: Secondary | ICD-10-CM | POA: Diagnosis not present

## 2017-12-09 DIAGNOSIS — T8140XA Infection following a procedure, unspecified, initial encounter: Principal | ICD-10-CM | POA: Diagnosis present

## 2017-12-09 DIAGNOSIS — J449 Chronic obstructive pulmonary disease, unspecified: Secondary | ICD-10-CM | POA: Diagnosis present

## 2017-12-09 DIAGNOSIS — R509 Fever, unspecified: Secondary | ICD-10-CM

## 2017-12-09 LAB — URINALYSIS, ROUTINE W REFLEX MICROSCOPIC
Bilirubin Urine: NEGATIVE
Glucose, UA: NEGATIVE mg/dL
Hgb urine dipstick: NEGATIVE
Ketones, ur: NEGATIVE mg/dL
Leukocytes, UA: NEGATIVE
Nitrite: NEGATIVE
Protein, ur: NEGATIVE mg/dL
SPECIFIC GRAVITY, URINE: 1.013 (ref 1.005–1.030)
pH: 7 (ref 5.0–8.0)

## 2017-12-09 LAB — CBC WITH DIFFERENTIAL/PLATELET
Basophils Absolute: 0.1 10*3/uL (ref 0–0.1)
Basophils Relative: 0 %
Eosinophils Absolute: 0.1 10*3/uL (ref 0–0.7)
Eosinophils Relative: 1 %
HCT: 34.6 % — ABNORMAL LOW (ref 35.0–47.0)
HEMOGLOBIN: 11.5 g/dL — AB (ref 12.0–16.0)
LYMPHS PCT: 11 %
Lymphs Abs: 1.3 10*3/uL (ref 1.0–3.6)
MCH: 29.8 pg (ref 26.0–34.0)
MCHC: 33.2 g/dL (ref 32.0–36.0)
MCV: 89.7 fL (ref 80.0–100.0)
Monocytes Absolute: 0.9 10*3/uL (ref 0.2–0.9)
Monocytes Relative: 8 %
NEUTROS ABS: 9.6 10*3/uL — AB (ref 1.4–6.5)
NEUTROS PCT: 80 %
Platelets: 337 10*3/uL (ref 150–440)
RBC: 3.86 MIL/uL (ref 3.80–5.20)
RDW: 14.1 % (ref 11.5–14.5)
WBC: 11.9 10*3/uL — AB (ref 3.6–11.0)

## 2017-12-09 LAB — COMPREHENSIVE METABOLIC PANEL
ALT: 41 U/L (ref 0–44)
AST: 30 U/L (ref 15–41)
Albumin: 3.5 g/dL (ref 3.5–5.0)
Alkaline Phosphatase: 104 U/L (ref 38–126)
Anion gap: 9 (ref 5–15)
BUN: 18 mg/dL (ref 6–20)
CHLORIDE: 97 mmol/L — AB (ref 98–111)
CO2: 27 mmol/L (ref 22–32)
Calcium: 8.8 mg/dL — ABNORMAL LOW (ref 8.9–10.3)
Creatinine, Ser: 0.58 mg/dL (ref 0.44–1.00)
Glucose, Bld: 129 mg/dL — ABNORMAL HIGH (ref 70–99)
POTASSIUM: 4 mmol/L (ref 3.5–5.1)
SODIUM: 133 mmol/L — AB (ref 135–145)
Total Bilirubin: 0.7 mg/dL (ref 0.3–1.2)
Total Protein: 7.2 g/dL (ref 6.5–8.1)

## 2017-12-09 LAB — LACTIC ACID, PLASMA: LACTIC ACID, VENOUS: 1.1 mmol/L (ref 0.5–1.9)

## 2017-12-09 MED ORDER — PIPERACILLIN-TAZOBACTAM 3.375 G IVPB 30 MIN
3.3750 g | Freq: Once | INTRAVENOUS | Status: AC
  Administered 2017-12-09: 3.375 g via INTRAVENOUS
  Filled 2017-12-09: qty 50

## 2017-12-09 MED ORDER — VANCOMYCIN HCL IN DEXTROSE 1-5 GM/200ML-% IV SOLN
1000.0000 mg | Freq: Once | INTRAVENOUS | Status: AC
  Administered 2017-12-09: 1000 mg via INTRAVENOUS
  Filled 2017-12-09: qty 200

## 2017-12-09 MED ORDER — SODIUM CHLORIDE 0.9 % IV BOLUS
1000.0000 mL | Freq: Once | INTRAVENOUS | Status: AC
  Administered 2017-12-09: 1000 mL via INTRAVENOUS

## 2017-12-09 NOTE — ED Notes (Signed)
Pt placed on oxygen after arrival for ra pox of 90%. Pt with rebound pox to 98% on oxygen.

## 2017-12-09 NOTE — ED Triage Notes (Signed)
Pt had back surgery here on 12/05/17, here at [facility]-fusion of L4/L5; pt says recovery has been going well until today; pt says she woke up freezing from a nap and checked her temp-102.9; took a cold shower but no medication; temp here 100.6; pt reports cough but nothing more than her usual COPD cough; denies urinary s/s; having some constipation since taking pain medications after surgery;

## 2017-12-09 NOTE — Progress Notes (Signed)
CODE SEPSIS - PHARMACY COMMUNICATION  **Broad Spectrum Antibiotics should be administered within 1 hour of Sepsis diagnosis**  Time Code Sepsis Called/Page Received: 21:45  Antibiotics Ordered: Vancomycin and Zosyn  Time of 1st antibiotic administration: Zosyn given at 22:03  Additional action taken by pharmacy: n/a  If necessary, Name of Provider/Nurse Contacted: n/a    Vira Blanco ,PharmD Clinical Pharmacist  12/09/2017  10:17 PM

## 2017-12-09 NOTE — ED Provider Notes (Addendum)
[facility] Emergency Department Provider Note  Time seen: 9:52 PM  I have reviewed the triage vital signs and the nursing notes.   HISTORY  Chief Complaint Fever    HPI Bailey Flores is a 57 y.o. female with a past medical history of COPD, gastric reflux, hypertension, recent lumbar fusion 12/05/2017 presents to the emergency department for fever.  According to the patient she had been doing well until today when she began feeling very hot took her temperature and it was 102 at home.  States she took a cold shower to try to cool her down and came to the emergency department for evaluation.  Upon arrival to the emergency department the patient had a temperature of 100.6, is tachycardic 126.  Patient denies any significant cough or congestion does state underlying history of COPD so she always has a mild cough.  Denies any dysuria.  Does state pain in her lower back but states she has had pain and tenderness ever since the surgery.  Denies any significant increase in pain since the surgery.   Past Medical History:  Diagnosis Date  . Arthritis    hands  . Asthma   . Barrett's esophagus   . Cancer [facility])    neuroendocrine ca  . COPD (chronic obstructive pulmonary disease) ([place])   . Depression   . GERD (gastroesophageal reflux disease)   . Hypertension   . Motion sickness    reading in car  . 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 Pneumonia 2017  . Sleep apnea     Patient Active Problem List   Diagnosis Date Noted  . Lumbar radiculopathy 12/05/2017  . Goals of care, counseling/discussion 07/17/2017  . Arthritis of right glenohumeral joint 06/13/2017  . Mild cognitive impairment 04/26/2017  . Intestinal metaplasia of gastric mucosa   . Gastritis without bleeding   . Barrett's esophagus without dysplasia   . Heartburn   . Loss of memory 01/18/2017  . Tremor 01/18/2017  . Attention deficit hyperactivity disorder (ADHD), combined type 01/16/2017  .  Bipolar 1 disorder ([place]) 01/16/2017  . Chronic abdominal pain 01/16/2017  . COPD, moderate ([place]) 01/16/2017  . Essential hypertension 01/16/2017  . Gastroesophageal reflux disease without esophagitis 01/16/2017  . Herpes simplex 01/16/2017  . Memory problem 01/16/2017    Past Surgical History:  Procedure Laterality Date  . ABDOMINAL HYSTERECTOMY    . BOWEL RESECTION  07/19/2012  . CESAREAN SECTION     x3  . ESOPHAGOGASTRODUODENOSCOPY (EGD) WITH PROPOFOL N/A 03/05/2017   Procedure: ESOPHAGOGASTRODUODENOSCOPY (EGD) WITH PROPOFOL;  Surgeon: [name], MD;  Location: [place];  Service: Endoscopy;  Laterality: N/A;  . ESOPHAGOGASTRODUODENOSCOPY (EGD) WITH PROPOFOL N/A 03/30/2017   REPEAT IN 03/2019  . HAND SURGERY Right   . TRANSFORAMINAL LUMBAR INTERBODY FUSION (TLIF) WITH PEDICLE SCREW FIXATION 1 LEVEL N/A 12/05/2017   Procedure: TRANSFORAMINAL LUMBAR INTERBODY FUSION (TLIF) WITH PEDICLE SCREW FIXATION 1 LEVEL-L4-5;  Surgeon: Meade Maw, MD;  Location: [facility];  Service: Neurosurgery;  Laterality: N/A;    Prior to Admission medications   Medication Sig Start Date End Date Taking? Authorizing Provider  albuterol (PROVENTIL HFA;VENTOLIN HFA) 108 (90 Base) MCG/ACT inhaler Inhale 1-2 puffs into the lungs every 4 (four) hours as needed for wheezing or shortness of breath.     [provider]  buPROPion (WELLBUTRIN SR) 200 MG 12 hr tablet Take 1 tablet (200 mg total) by mouth 2 (two) times daily. 10/09/17   [name], MD  celecoxib (CELEBREX)  100 MG capsule Take 1 capsule (100 mg total) by mouth 2 (two) times daily. 12/07/17   [name], PA-C  Fluticasone-Salmeterol (ADVAIR DISKUS) 250-50 MCG/DOSE AEPB Inhale 1 puff into the lungs 2 (two) times daily. Patient taking differently: Inhale 1 puff into the lungs daily.  07/26/17   [name], MD  gabapentin (NEURONTIN) 300 MG capsule Take 300 mg 3 (three) times daily by mouth.  02/15/17   [provider]  ipratropium-albuterol (DUONEB) 0.5-2.5 (3) MG/3ML SOLN Inhale 3 mLs into the lungs every 4 (four) hours as needed (Only uses with cold symptoms).  01/16/17 01/11/18  [provider]  lamoTRIgine (LAMICTAL) 100 MG tablet Take 1-2 tablets (100-200 mg total) by mouth daily. 100mg  qam and 200 mg qpm Patient taking differently: Take 100-200 mg by mouth See admin instructions. 100mg  every morning,  and 200 mg at bedtime 10/09/17   [name], MD  lisdexamfetamine (VYVANSE) 70 MG capsule Take 1 capsule (70 mg total) by mouth daily. 10/12/17   [name], MD  losartan-hydrochlorothiazide (HYZAAR) 50-12.5 MG tablet Take 1 tablet by mouth daily.  01/16/17   [provider]  lubiprostone (AMITIZA) 24 MCG capsule Take 24 mcg by mouth daily with breakfast.  01/16/17   [provider]  MAGNESIUM PO Take 500 mg by mouth daily.     [provider]  methocarbamol (ROBAXIN) 750 MG tablet Take 1 tablet (750 mg total) by mouth every 6 (six) hours. 12/07/17   [name], PA-C  milk thistle 175 MG tablet Take 175 mg by mouth daily.    [provider]  montelukast (SINGULAIR) 10 MG tablet Take 10 mg by mouth at bedtime.  01/16/17   [provider]  Multiple Vitamins-Minerals (CENTRUM SILVER PO) Take 1 tablet by mouth daily.     [provider]  omeprazole (PRILOSEC) 40 MG capsule Take 40 mg by mouth daily.  01/16/17   [provider]  orlistat (ALLI) 60 MG capsule Take 60 mg by mouth 2 (two) times daily with a meal.    [provider]  oxyCODONE (OXY IR/ROXICODONE) 5 MG immediate release tablet Take 1 tablet (5 mg total) by mouth every 3 (three) hours as needed for moderate pain ((score 4 to 6)). 12/07/17   [name], PA-C  SPIRIVA RESPIMAT 2.5 MCG/ACT AERS Inhale 2 puffs into the lungs every morning.  06/07/17   [provider]    Allergies  Allergen Reactions  . Hydrocodone-Acetaminophen Itching     Family History  Problem Relation Age of Onset  . Breast cancer Cousin        mat cousin    Social History Social History   Tobacco Use  . Smoking status: Former Smoker    Packs/day: 2.00    Years: 37.00    Pack years: 74.00    Types: Cigarettes    Last attempt to quit: 07/19/2012    Years since quitting: 5.3  . Smokeless tobacco: Never Used  Substance Use Topics  . Alcohol use: Yes    Comment: 2 drinks/mo  . Drug use: No    Review of Systems Constitutional: Positive for fever to 102 Eyes: Negative for visual complaints ENT: Negative for recent illness/congestion Cardiovascular: Negative for chest pain. Respiratory: Negative for shortness of breath.  Mild cough, chronic Gastrointestinal: Negative for abdominal pain, vomiting  Genitourinary: Negative for dysuria. Musculoskeletal: Lower back pain, largely unchanged since surgery. Skin: Negative for skin complaints  Neurological: Negative for headache All other ROS negative  ____________________________________________   PHYSICAL EXAM:  VITAL SIGNS: ED Triage Vitals  Enc Vitals Group     BP 12/09/17 2136 140/83     Pulse Rate 12/09/17 2136 (!) 126     Resp 12/09/17 2136 19     Temp 12/09/17 2136 (!) 100.6 F (38.1 C)     Temp Source 12/09/17 2136 Oral     SpO2 12/09/17 2136 92 %     Weight 12/09/17 2137 135 lb (61.2 kg)     Height 12/09/17 2137 4\' 10"  (1.473 m)     Head Circumference --      Peak Flow --      Pain Score 12/09/17 2135 8     Pain Loc --      Pain Edu? --      Excl. in [place]? --    Constitutional: Alert and oriented. Well appearing and in no distress. Eyes: Normal exam ENT   Head: Normocephalic and atraumatic   Mouth/Throat: Mucous membranes are moist. Cardiovascular: Normal rate, regular rhythm. No murmur Respiratory: Normal respiratory effort without tachypnea nor retractions. Breath sounds are clear  Gastrointestinal: Soft and nontender. No distention.   Musculoskeletal:  Patient has well-appearing lumbar incisions with mild to moderate tenderness around the incision site but no dehiscence or significant erythema. Neurologic:  Normal speech and language. No gross focal neurologic deficits  Skin:  Skin is warm, dry and intact.  Psychiatric: Mood and affect are normal.   ____________________________________________    EKG  EKG reviewed and interpreted by myself shows sinus tachycardia 116 bpm with a narrow QRS, normal axis, normal intervals, nonspecific no concerning ST changes.  ____________________________________________    RADIOLOGY  Chest x-ray is negative  ____________________________________________   INITIAL IMPRESSION / ASSESSMENT AND PLAN / ED COURSE  Pertinent labs & imaging results that were available during my care of the patient were reviewed by me and considered in my medical decision making (see chart for details).  Patient presents to the emergency department for fever approximately 4 days after having a lumbar fusion performed by Dr. Cari Caraway.  Patient states lower back pain she does have tenderness over the area but no obvious dehiscence or signs of infection.  Differential would include pneumonia, urinary tract infection, postsurgical infection.  Patient has a reassuringly normal exam including clear lung sounds, nontender abdomen.  She does have tenderness over the incision area however it was a very recent surgery.  Reassuringly patient's labs have begun to resolve, lactate is normal at 1.1, white blood cell count is slightly elevated at 11.9.  Patient's urinalysis is normal.  Chemistry is largely normal.  We will discuss with Dr. Cari Caraway for further recommendations  Patient's work-up is surprisingly normal.  Urinalysis and chest x-ray normal.  I discussed the patient with Dr. Cari Caraway, given the fever at home and her tachycardia it would not be unreasonable to admit to the hospital tonight to continue with IV antibiotics and he  will have Dr. Lacinda Axon to see the patient tomorrow in consultation.  I discussed the patient with hospitalist they are agreeable to this plan of care.  Dr. Cari Caraway is also requesting an MRI with and without contrast.  Will order this.  I talked to the hospitalist they are agreeable to admit.  CRITICAL CARE Performed by: Harvest Dark   Total critical care time: 30 minutes  Critical care time was exclusive of separately billable procedures and treating other patients.  Critical care was necessary to treat or prevent imminent or life-threatening deterioration.  Critical care was time spent personally by me on the following activities: development of treatment plan with patient and/or surrogate as well as nursing, discussions with consultants, evaluation of patient's response to treatment, examination of patient, obtaining history from patient or surrogate, ordering and performing treatments and interventions, ordering and review of laboratory studies, ordering and review of radiographic studies, pulse oximetry and re-evaluation of patient's condition.   ____________________________________________   FINAL CLINICAL IMPRESSION(S) / ED DIAGNOSES  Fever Sepsis   Harvest Dark, MD 12/09/17 [id number]    Harvest Dark, MD 12/09/17 [phone]

## 2017-12-09 NOTE — ED Notes (Signed)
All of zosyn has infused, error in charting total volume, 58mL was infused.

## 2017-12-10 ENCOUNTER — Other Ambulatory Visit: Payer: Self-pay

## 2017-12-10 ENCOUNTER — Emergency Department

## 2017-12-10 DIAGNOSIS — T8144XA Sepsis following a procedure, initial encounter: Secondary | ICD-10-CM | POA: Diagnosis present

## 2017-12-10 DIAGNOSIS — Z87891 Personal history of nicotine dependence: Secondary | ICD-10-CM | POA: Diagnosis not present

## 2017-12-10 DIAGNOSIS — K219 Gastro-esophageal reflux disease without esophagitis: Secondary | ICD-10-CM | POA: Diagnosis present

## 2017-12-10 DIAGNOSIS — I10 Essential (primary) hypertension: Secondary | ICD-10-CM | POA: Diagnosis present

## 2017-12-10 DIAGNOSIS — T8140XA Infection following a procedure, unspecified, initial encounter: Secondary | ICD-10-CM | POA: Diagnosis present

## 2017-12-10 DIAGNOSIS — A419 Sepsis, unspecified organism: Secondary | ICD-10-CM | POA: Diagnosis present

## 2017-12-10 DIAGNOSIS — Y848 Other medical procedures as the cause of abnormal reaction of the patient, or of later complication, without mention of misadventure at the time of the procedure: Secondary | ICD-10-CM | POA: Diagnosis present

## 2017-12-10 DIAGNOSIS — G4730 Sleep apnea, unspecified: Secondary | ICD-10-CM | POA: Diagnosis present

## 2017-12-10 DIAGNOSIS — Z90710 Acquired absence of both cervix and uterus: Secondary | ICD-10-CM | POA: Diagnosis not present

## 2017-12-10 DIAGNOSIS — J449 Chronic obstructive pulmonary disease, unspecified: Secondary | ICD-10-CM | POA: Diagnosis present

## 2017-12-10 LAB — CBC
HEMATOCRIT: 29.4 % — AB (ref 35.0–47.0)
Hemoglobin: 9.9 g/dL — ABNORMAL LOW (ref 12.0–16.0)
MCH: 30.6 pg (ref 26.0–34.0)
MCHC: 33.8 g/dL (ref 32.0–36.0)
MCV: 90.6 fL (ref 80.0–100.0)
Platelets: 272 10*3/uL (ref 150–440)
RBC: 3.25 MIL/uL — ABNORMAL LOW (ref 3.80–5.20)
RDW: 14.4 % (ref 11.5–14.5)
WBC: 10.2 10*3/uL (ref 3.6–11.0)

## 2017-12-10 LAB — BASIC METABOLIC PANEL
Anion gap: 5 (ref 5–15)
BUN: 14 mg/dL (ref 6–20)
CO2: 26 mmol/L (ref 22–32)
Calcium: 7.9 mg/dL — ABNORMAL LOW (ref 8.9–10.3)
Chloride: 108 mmol/L (ref 98–111)
Creatinine, Ser: 0.55 mg/dL (ref 0.44–1.00)
GFR calc Af Amer: >60 mL/min (ref >60)
GFR calc non Af Amer: >60 mL/min (ref >60)
GLUCOSE: 116 mg/dL — AB (ref 70–99)
POTASSIUM: 3.7 mmol/L (ref 3.5–5.1)
Sodium: 139 mmol/L (ref 135–145)

## 2017-12-10 LAB — GLUCOSE, CAPILLARY: Glucose-Capillary: 94 mg/dL (ref 70–99)

## 2017-12-10 MED ORDER — ACETAMINOPHEN 650 MG RE SUPP: 650.0000 mg | Freq: Four times a day (QID) | RECTAL | Status: DC | PRN

## 2017-12-10 MED ORDER — HYDROCHLOROTHIAZIDE 12.5 MG PO CAPS
12.5000 mg | ORAL_CAPSULE | Freq: Every day | ORAL | Status: DC
  Administered 2017-12-10: 12.5 mg via ORAL
  Filled 2017-12-10: qty 1

## 2017-12-10 MED ORDER — MILK THISTLE 175 MG PO TABS: 175.0000 mg | ORAL_TABLET | Freq: Every evening | ORAL | Status: DC

## 2017-12-10 MED ORDER — GABAPENTIN 300 MG PO CAPS
300.0000 mg | ORAL_CAPSULE | Freq: Three times a day (TID) | ORAL | Status: DC
  Administered 2017-12-10 – 2017-12-11 (×4): 300 mg via ORAL
  Filled 2017-12-10 (×4): qty 1

## 2017-12-10 MED ORDER — ADULT MULTIVITAMIN W/MINERALS CH
1.0000 | ORAL_TABLET | Freq: Every day | ORAL | Status: DC
  Administered 2017-12-10 – 2017-12-11 (×2): 1 via ORAL
  Filled 2017-12-10 (×2): qty 1

## 2017-12-10 MED ORDER — LAMOTRIGINE 100 MG PO TABS
200.0000 mg | ORAL_TABLET | Freq: Every day | ORAL | Status: DC
  Administered 2017-12-10: 200 mg via ORAL
  Filled 2017-12-10 (×2): qty 2
  Filled 2017-12-10: qty 8

## 2017-12-10 MED ORDER — METHOCARBAMOL 500 MG PO TABS
750.0000 mg | ORAL_TABLET | Freq: Four times a day (QID) | ORAL | Status: DC
  Administered 2017-12-10 – 2017-12-11 (×6): 750 mg via ORAL
  Filled 2017-12-10 (×6): qty 2

## 2017-12-10 MED ORDER — ORLISTAT 60 MG PO CAPS: 60.0000 mg | ORAL_CAPSULE | Freq: Two times a day (BID) | ORAL | Status: DC

## 2017-12-10 MED ORDER — MAGNESIUM OXIDE 400 (241.3 MG) MG PO TABS
400.0000 mg | ORAL_TABLET | Freq: Every evening | ORAL | Status: DC
  Administered 2017-12-10: 400 mg via ORAL
  Filled 2017-12-10: qty 1

## 2017-12-10 MED ORDER — BISACODYL 5 MG PO TBEC: 5.0000 mg | DELAYED_RELEASE_TABLET | Freq: Every day | ORAL | Status: DC | PRN

## 2017-12-10 MED ORDER — HYDROMORPHONE HCL 1 MG/ML IJ SOLN
INTRAMUSCULAR | Status: AC
  Filled 2017-12-10: qty 1

## 2017-12-10 MED ORDER — PANTOPRAZOLE SODIUM 40 MG PO TBEC
40.0000 mg | DELAYED_RELEASE_TABLET | Freq: Every day | ORAL | Status: DC
  Administered 2017-12-10 – 2017-12-11 (×2): 40 mg via ORAL
  Filled 2017-12-10 (×2): qty 1

## 2017-12-10 MED ORDER — ONDANSETRON HCL 4 MG/2ML IJ SOLN: 4.0000 mg | Freq: Four times a day (QID) | INTRAMUSCULAR | Status: DC | PRN

## 2017-12-10 MED ORDER — GADOBENATE DIMEGLUMINE 529 MG/ML IV SOLN
12.0000 mL | Freq: Once | INTRAVENOUS | Status: AC | PRN
  Administered 2017-12-10: 12 mL via INTRAVENOUS

## 2017-12-10 MED ORDER — LOSARTAN POTASSIUM 50 MG PO TABS
50.0000 mg | ORAL_TABLET | Freq: Every day | ORAL | Status: DC
  Administered 2017-12-10: 50 mg via ORAL
  Filled 2017-12-10: qty 1

## 2017-12-10 MED ORDER — ONDANSETRON HCL 4 MG PO TABS: 4.0000 mg | ORAL_TABLET | Freq: Four times a day (QID) | ORAL | Status: DC | PRN

## 2017-12-10 MED ORDER — SODIUM CHLORIDE 0.9 % IV SOLN
INTRAVENOUS | Status: DC
  Administered 2017-12-10: 03:00:00 via INTRAVENOUS

## 2017-12-10 MED ORDER — FLUTICASONE FUROATE-VILANTEROL 200-25 MCG/INH IN AEPB
1.0000 | INHALATION_SPRAY | Freq: Every day | RESPIRATORY_TRACT | Status: DC
  Administered 2017-12-10 – 2017-12-11 (×2): 1 via RESPIRATORY_TRACT
  Filled 2017-12-10: qty 28

## 2017-12-10 MED ORDER — OXYCODONE HCL 5 MG PO TABS
5.0000 mg | ORAL_TABLET | ORAL | Status: DC | PRN
  Administered 2017-12-10 – 2017-12-11 (×6): 5 mg via ORAL
  Filled 2017-12-10 (×7): qty 1

## 2017-12-10 MED ORDER — BUPROPION HCL ER (SR) 100 MG PO TB12
200.0000 mg | ORAL_TABLET | Freq: Two times a day (BID) | ORAL | Status: DC
  Administered 2017-12-10 – 2017-12-11 (×3): 200 mg via ORAL
  Filled 2017-12-10 (×4): qty 2

## 2017-12-10 MED ORDER — LISDEXAMFETAMINE DIMESYLATE 30 MG PO CAPS
70.0000 mg | ORAL_CAPSULE | Freq: Every day | ORAL | Status: DC
  Administered 2017-12-10 – 2017-12-11 (×2): 70 mg via ORAL
  Filled 2017-12-10 (×2): qty 1

## 2017-12-10 MED ORDER — ALBUTEROL SULFATE (2.5 MG/3ML) 0.083% IN NEBU: 3.0000 mL | INHALATION_SOLUTION | RESPIRATORY_TRACT | Status: DC | PRN

## 2017-12-10 MED ORDER — VANCOMYCIN HCL IN DEXTROSE 1-5 GM/200ML-% IV SOLN
1000.0000 mg | INTRAVENOUS | Status: DC
  Administered 2017-12-10 – 2017-12-11 (×2): 1000 mg via INTRAVENOUS
  Filled 2017-12-10 (×3): qty 200

## 2017-12-10 MED ORDER — HEPARIN SODIUM (PORCINE) 5000 UNIT/ML IJ SOLN
5000.0000 [IU] | Freq: Three times a day (TID) | INTRAMUSCULAR | Status: DC
  Administered 2017-12-10 – 2017-12-11 (×4): 5000 [IU] via SUBCUTANEOUS
  Filled 2017-12-10 (×4): qty 1

## 2017-12-10 MED ORDER — MONTELUKAST SODIUM 10 MG PO TABS
10.0000 mg | ORAL_TABLET | Freq: Every day | ORAL | Status: DC
  Administered 2017-12-10 (×2): 10 mg via ORAL
  Filled 2017-12-10 (×2): qty 1

## 2017-12-10 MED ORDER — TIOTROPIUM BROMIDE MONOHYDRATE 18 MCG IN CAPS
1.0000 | ORAL_CAPSULE | RESPIRATORY_TRACT | Status: DC
  Administered 2017-12-10 – 2017-12-11 (×2): 18 ug via RESPIRATORY_TRACT
  Filled 2017-12-10: qty 5

## 2017-12-10 MED ORDER — PIPERACILLIN-TAZOBACTAM 3.375 G IVPB
3.3750 g | Freq: Three times a day (TID) | INTRAVENOUS | Status: DC
  Administered 2017-12-10 – 2017-12-11 (×4): 3.375 g via INTRAVENOUS
  Filled 2017-12-10 (×4): qty 50

## 2017-12-10 MED ORDER — TRAZODONE HCL 50 MG PO TABS: 25.0000 mg | ORAL_TABLET | Freq: Every evening | ORAL | Status: DC | PRN

## 2017-12-10 MED ORDER — IPRATROPIUM-ALBUTEROL 0.5-2.5 (3) MG/3ML IN SOLN: 3.0000 mL | RESPIRATORY_TRACT | Status: DC | PRN

## 2017-12-10 MED ORDER — ACETAMINOPHEN 500 MG PO TABS
1000.0000 mg | ORAL_TABLET | Freq: Once | ORAL | Status: AC
  Administered 2017-12-10: 1000 mg via ORAL

## 2017-12-10 MED ORDER — HYDROMORPHONE HCL 1 MG/ML IJ SOLN
1.0000 mg | Freq: Once | INTRAMUSCULAR | Status: AC
  Administered 2017-12-10: 1 mg via INTRAVENOUS

## 2017-12-10 MED ORDER — ACETAMINOPHEN 325 MG PO TABS: 650.0000 mg | ORAL_TABLET | Freq: Four times a day (QID) | ORAL | Status: DC | PRN

## 2017-12-10 MED ORDER — DOCUSATE SODIUM 100 MG PO CAPS
100.0000 mg | ORAL_CAPSULE | Freq: Two times a day (BID) | ORAL | Status: DC
  Administered 2017-12-10 – 2017-12-11 (×3): 100 mg via ORAL
  Filled 2017-12-10 (×3): qty 1

## 2017-12-10 MED ORDER — LOSARTAN POTASSIUM-HCTZ 50-12.5 MG PO TABS: 1.0000 | ORAL_TABLET | Freq: Every day | ORAL | Status: DC

## 2017-12-10 MED ORDER — LAMOTRIGINE 25 MG PO TABS: 100.0000 mg | ORAL_TABLET | ORAL | Status: DC

## 2017-12-10 MED ORDER — ACETAMINOPHEN 500 MG PO TABS
ORAL_TABLET | ORAL | Status: AC
  Filled 2017-12-10: qty 2

## 2017-12-10 MED ORDER — LAMOTRIGINE 100 MG PO TABS
100.0000 mg | ORAL_TABLET | Freq: Every morning | ORAL | Status: DC
  Administered 2017-12-10 – 2017-12-11 (×2): 100 mg via ORAL
  Filled 2017-12-10 (×2): qty 4
  Filled 2017-12-10 (×2): qty 1

## 2017-12-10 MED ORDER — LUBIPROSTONE 24 MCG PO CAPS
24.0000 ug | ORAL_CAPSULE | Freq: Every day | ORAL | Status: DC
Start: 2017-12-10 — End: 2017-12-11
  Administered 2017-12-10 – 2017-12-11 (×2): 24 ug via ORAL
  Filled 2017-12-10 (×2): qty 1

## 2017-12-10 NOTE — Progress Notes (Signed)
Admitted this morning for fever, sepsis.  WBC was up when she came but WBC decreased from 11.9-10.2.  Patient feels better.  Patient had a history of L4-L5 transforaminal lumbar interbody fusion with screw by neurosurgery on July 17.  Patient does have incision site clean at this time.  Does have pain issues in the back controlled well with pain medicine. Labs reviewed .  Physical examination: S1, S2 regular Lungs: Clear to auscultation Abdomen: Soft, nontender, nondistended. Examination of lumbosacral spine: Patient incision site is clean Assessment and plan early sepsis: On IV antibiotics with vancomycin, Zosyn if the patient is afebrile discharge home tomorrow at this time 24 hours discharge home tomorrow.  Follow  blood cultures, urine culture results,neurosurgery consult requested because of her recent back surgery.  Continue physical therapy, pain medicines. Time spent 20 minutes.

## 2017-12-10 NOTE — H&P (Signed)
[facility] [place] NAME: Bailey Flores    MR#:  [id number]  DATE OF BIRTH:  [birth year]  DATE OF ADMISSION:  12/09/2017  PRIMARY CARE PHYSICIAN: [name], MD   REQUESTING/REFERRING PHYSICIAN:   CHIEF COMPLAINT:   Chief Complaint  Patient presents with  . Fever    HISTORY OF PRESENT ILLNESS: Bailey Flores  is a 57 y.o. female with a known history of osteoarthritis, COPD, anxiety/depression disorder, hypertension and other comorbidities.  Patient is status post recent lumbar fusion done on December 05, 2017.  She presented to emergency room for fever, 102 at home.  She denies any other symptoms except for soreness at the surgical site.  She has some productive cough but she thinks this is chronic, secondary to COPD. At the arrival to emergency room patient was noted with tachycardia at 126 and temperature elevated at 100.6.  Blood test done emergency room are notable for elevated WBC at 11.9.  Lactic acid level is 1.1.  UA is negative for UTI. Chest x-ray, evaluated by myself, is negative for acute cardiopulmonary abnormalities. Patient is admitted for further evaluation and treatment.  PAST MEDICAL HISTORY:   Past Medical History:  Diagnosis Date  . Arthritis    hands  . Asthma   . Barrett's esophagus   . Cancer [facility])    neuroendocrine ca  . COPD (chronic obstructive pulmonary disease) ([place])   . Depression   . GERD (gastroesophageal reflux disease)   . Hypertension   . Motion sickness    reading in car  . 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 Pneumonia 2017  . Sleep apnea     PAST SURGICAL HISTORY:  Past Surgical History:  Procedure Laterality Date  . ABDOMINAL HYSTERECTOMY    . BOWEL RESECTION  07/19/2012  . CESAREAN SECTION     x3  . ESOPHAGOGASTRODUODENOSCOPY (EGD) WITH PROPOFOL N/A 03/05/2017   Procedure: ESOPHAGOGASTRODUODENOSCOPY (EGD) WITH PROPOFOL;  Surgeon: [name], MD;  Location: [place];  Service: Endoscopy;  Laterality: N/A;  . ESOPHAGOGASTRODUODENOSCOPY (EGD) WITH PROPOFOL N/A 03/30/2017   REPEAT IN 03/2019  . HAND SURGERY Right   . TRANSFORAMINAL LUMBAR INTERBODY FUSION (TLIF) WITH PEDICLE SCREW FIXATION 1 LEVEL N/A 12/05/2017   Procedure: TRANSFORAMINAL LUMBAR INTERBODY FUSION (TLIF) WITH PEDICLE SCREW FIXATION 1 LEVEL-L4-5;  Surgeon: Meade Maw, MD;  Location: [facility];  Service: Neurosurgery;  Laterality: N/A;    SOCIAL HISTORY:  Social History   Tobacco Use  . Smoking status: Former Smoker    Packs/day: 2.00    Years: 37.00    Pack years: 74.00    Types: Cigarettes    Last attempt to quit: 07/19/2012    Years since quitting: 5.3  . Smokeless tobacco: Never Used  Substance Use Topics  . Alcohol use: Yes    Comment: 2 drinks/mo    FAMILY HISTORY:  Family History  Problem Relation Age of Onset  . Breast cancer Cousin        mat cousin    DRUG ALLERGIES:  Allergies  Allergen Reactions  . Hydrocodone-Acetaminophen Itching    REVIEW OF SYSTEMS:   CONSTITUTIONAL: Positive for fever, fatigue and generalized weakness.  EYES: No blurred or double vision.  EARS, NOSE, AND THROAT: No tinnitus or ear pain.  RESPIRATORY: Positive for chronic cough, but no shortness of breath, wheezing or hemoptysis.  CARDIOVASCULAR: No chest pain, orthopnea, edema.  GASTROINTESTINAL: No nausea, vomiting, diarrhea or abdominal pain.  GENITOURINARY:  No dysuria, hematuria.  ENDOCRINE: No polyuria, nocturia,  HEMATOLOGY: No bleeding SKIN: No rash or lesion. MUSCULOSKELETAL: Positive for back pain at lumbar level, secondary to recent fusion surgery.   NEUROLOGIC: No tingling, numbness, no focal weakness.  PSYCHIATRY: Positive for history of anxiety and depression.   MEDICATIONS AT HOME:  Prior to Admission medications   Medication Sig Start Date End Date Taking? Authorizing Provider  albuterol (PROVENTIL HFA;VENTOLIN HFA) 108 (90 Base) MCG/ACT inhaler  Inhale 1-2 puffs into the lungs every 4 (four) hours as needed for wheezing or shortness of breath.    Yes [provider]  buPROPion (WELLBUTRIN SR) 200 MG 12 hr tablet Take 1 tablet (200 mg total) by mouth 2 (two) times daily. 10/09/17  Yes [name], MD  celecoxib (CELEBREX) 100 MG capsule Take 1 capsule (100 mg total) by mouth 2 (two) times daily. 12/07/17  Yes [name], PA-C  Fluticasone-Salmeterol (ADVAIR DISKUS) 250-50 MCG/DOSE AEPB Inhale 1 puff into the lungs 2 (two) times daily. Patient taking differently: Inhale 1 puff into the lungs daily.  07/26/17  Yes [name], MD  gabapentin (NEURONTIN) 300 MG capsule Take 300 mg 3 (three) times daily by mouth.  02/15/17  Yes [provider]  ipratropium-albuterol (DUONEB) 0.5-2.5 (3) MG/3ML SOLN Inhale 3 mLs into the lungs every 4 (four) hours as needed (Only uses with cold symptoms).  01/16/17 01/11/18 Yes [provider]  lamoTRIgine (LAMICTAL) 100 MG tablet Take 1-2 tablets (100-200 mg total) by mouth daily. 100mg  qam and 200 mg qpm Patient taking differently: Take 100-200 mg by mouth See admin instructions. 100mg  every morning,  and 200 mg at bedtime 10/09/17  Yes [name], Saramma, MD  lisdexamfetamine (VYVANSE) 70 MG capsule Take 1 capsule (70 mg total) by mouth daily. 10/12/17  Yes [name], MD  losartan-hydrochlorothiazide (HYZAAR) 50-12.5 MG tablet Take 1 tablet by mouth at bedtime.  01/16/17  Yes [provider]  lubiprostone (AMITIZA) 24 MCG capsule Take 24 mcg by mouth daily with breakfast.  01/16/17  Yes [provider]  MAGNESIUM PO Take 500 mg by mouth every evening.    Yes [provider]  methocarbamol (ROBAXIN) 750 MG tablet Take 1 tablet (750 mg total) by mouth every 6 (six) hours. 12/07/17  Yes [name], PA-C  milk thistle 175 MG tablet Take 175 mg by mouth every evening.    Yes [provider]  montelukast (SINGULAIR) 10 MG tablet Take 10 mg by  mouth at bedtime.  01/16/17  Yes [provider]  Multiple Vitamins-Minerals (CENTRUM SILVER PO) Take 1 tablet by mouth daily.    Yes [provider]  omeprazole (PRILOSEC) 40 MG capsule Take 40 mg by mouth daily.  01/16/17  Yes [provider]  orlistat (ALLI) 60 MG capsule Take 60 mg by mouth 2 (two) times daily with a meal.   Yes [provider]  oxyCODONE (OXY IR/ROXICODONE) 5 MG immediate release tablet Take 1 tablet (5 mg total) by mouth every 3 (three) hours as needed for moderate pain ((score 4 to 6)). 12/07/17  Yes [name], PA-C  SPIRIVA RESPIMAT 2.5 MCG/ACT AERS Inhale 2 puffs into the lungs every morning.  06/07/17  Yes [provider]      PHYSICAL EXAMINATION:   VITAL SIGNS: Blood pressure 115/64, pulse (!) 110, temperature 99 F (37.2 C), temperature source Oral, resp. rate 18, height 4\' 10"  (1.473 m), weight 61.2 kg (135 lb), SpO2 98 %.  GENERAL:  57 y.o.-year-old patient  lying in the bed with no acute distress.  EYES: Pupils equal, round, reactive to light and accommodation. No scleral icterus. Extraocular muscles intact.  HEENT: Head atraumatic, normocephalic. Oropharynx and nasopharynx clear.  NECK:  Supple, no jugular venous distention. No thyroid enlargement, no tenderness.  LUNGS: Normal breath sounds bilaterally, no wheezing, rales,rhonchi or crepitation. No use of accessory muscles of respiration.  CARDIOVASCULAR: S1, S2 normal. No S3/S4.  ABDOMEN: Soft, nontender, nondistended. Bowel sounds present. No organomegaly or mass.  EXTREMITIES: No pedal edema, cyanosis, or clubbing.  NEUROLOGIC: Cranial nerves II through XII are intact. Muscle strength 5/5 in all extremities. Sensation intact. Gait not checked.  PSYCHIATRIC: The patient is alert and oriented x 3.  SKIN: Lumbar incision status post fusion surgery is healing well.  Although it is tender with palpation there is no, dehiscence noted, no bleeding or discharge.    LABORATORY PANEL:   CBC Recent Labs  Lab 12/04/17 1052 12/09/17 2156  WBC 8.8 11.9*  HGB 13.8 11.5*  HCT 41.4 34.6*  PLT 363 337  MCV 90.5 89.7  MCH 30.2 29.8  MCHC 33.4 33.2  RDW 14.3 14.1  LYMPHSABS  --  1.3  MONOABS  --  0.9  EOSABS  --  0.1  BASOSABS  --  0.1   ------------------------------------------------------------------------------------------------------------------  Chemistries  Recent Labs  Lab 12/04/17 1052 12/09/17 2156  NA 140 133*  K 4.2 4.0  CL 104 97*  CO2 26 27  GLUCOSE 98 129*  BUN 16 18  CREATININE 0.67 0.58  CALCIUM 10.1 8.8*  AST  --  30  ALT  --  41  ALKPHOS  --  104  BILITOT  --  0.7   ------------------------------------------------------------------------------------------------------------------ estimated creatinine clearance is 58.6 mL/min (by C-G formula based on SCr of 0.58 mg/dL). ------------------------------------------------------------------------------------------------------------------ No results for input(s): TSH, T4TOTAL, T3FREE, THYROIDAB in the last 72 hours.  Invalid input(s): FREET3   Coagulation profile Recent Labs  Lab 12/04/17 1052  INR 0.96   ------------------------------------------------------------------------------------------------------------------- No results for input(s): DDIMER in the last 72 hours. -------------------------------------------------------------------------------------------------------------------  Cardiac Enzymes No results for input(s): CKMB, TROPONINI, MYOGLOBIN in the last 168 hours.  Invalid input(s): CK ------------------------------------------------------------------------------------------------------------------ Invalid input(s): POCBNP  ---------------------------------------------------------------------------------------------------------------  Urinalysis    Component Value Date/Time   COLORURINE YELLOW (A) 12/09/2017 2222   APPEARANCEUR CLEAR (A)  12/09/2017 2222   LABSPEC 1.013 12/09/2017 2222   PHURINE 7.0 12/09/2017 2222   GLUCOSEU NEGATIVE 12/09/2017 2222   HGBUR NEGATIVE 12/09/2017 2222   BILIRUBINUR NEGATIVE 12/09/2017 2222   KETONESUR NEGATIVE 12/09/2017 2222   PROTEINUR NEGATIVE 12/09/2017 2222   NITRITE NEGATIVE 12/09/2017 2222   LEUKOCYTESUR NEGATIVE 12/09/2017 2222     RADIOLOGY: Dg Chest Port 1 View  Result Date: 12/09/2017 CLINICAL DATA:  Fever today.  History of back surgery on 12/05/2017 EXAM: PORTABLE CHEST 1 VIEW COMPARISON:  CT chest 04/21/2017.  Chest 10/22/2015. FINDINGS: Normal heart size and pulmonary vascularity. No focal airspace disease or consolidation in the lungs. No blunting of costophrenic angles. No pneumothorax. Mediastinal contours appear intact. Degenerative changes in the shoulders. IMPRESSION: No active disease. Electronically Signed   By: Lucienne Capers M.D.   On: 12/09/2017 22:47    EKG: Orders placed or performed during the hospital encounter of 12/09/17  . ED EKG 12-Lead  . ED EKG 12-Lead    IMPRESSION AND PLAN:  1.  Sepsis, unclear etiology.  We will start IV fluids and broad-spectrum IV antibiotics.  Will check blood cultures and urine culture.  Will  check L-spine MRI, to evaluate for infection at the surgical site.  Surgery is consulted for further evaluation and treatment. 2.  COPD, without acute exacerbation, continue home maintenance therapy. 3.  Hypertension, stable, continue home meds.  All the records are reviewed and case discussed with ED provider. Management plans discussed with the patient, family and they are in agreement.  CODE STATUS: Full Code Status History    Date Active Date Inactive Code Status Order ID Comments User Context   12/05/2017 1249 12/07/2017 1619 Full Code [id number]  [name], PA-C Inpatient       TOTAL TIME TAKING CARE OF THIS PATIENT: 45 minutes.    Amelia Jo M.D on 12/10/2017 at 12:58 AM  Between 7am to 6pm - Pager -  [phone]  After 6pm go to [email or web address] - password [email or web address] [facility]  [place] [facility]  Office  [phone]  CC: Primary care physician; [name], MD

## 2017-12-10 NOTE — Progress Notes (Signed)
History: Bailey Flores is admitted for fever POD5 from L4-5 TLIF. She was released to home POD2 without issue. States that on the evening of POD4 she was experiencing chills and at-home temperature was 102. She contacted neurosurgeon on call and was told to go to emergency room for evaluation. Main complaint is of pain - including lumbar incision sites and posterior thighs. Denies nausea, vomiting, diarrhea, or recently sick contacts. Also denies issue with incision site including drainage or edema.  Continues to take oxycodone, celebrex, and robaxin for pain. Denies acetaminophen.   Physical Exam: Vitals:   12/10/17 0808 12/10/17 1442  BP: 110/72 105/68  Pulse: 85 92  Resp:    Temp: 97.7 F (36.5 C) 97.7 F (36.5 C)  SpO2: 98% 97%    AA Ox3 CNI Skin: incision site intact, glue present and intact. Swelling between left incision sites that was present following surgery and has improved. Bruising noted around drainage site and superior aspect of right lumbar incision site. Tenderness at these areas. No drainage, erythema, warmth, fluctuance.   Strength:5/5 throughout Sensation: intact and symmetric  Data:  Recent Labs  Lab 12/04/17 1052 12/09/17 2156 12/10/17 0431  NA 140 133* 139  K 4.2 4.0 3.7  CL 104 97* 108  CO2 26 27 26   BUN 16 18 14   CREATININE 0.67 0.58 0.55  GLUCOSE 98 129* 116*  CALCIUM 10.1 8.8* 7.9*   Recent Labs  Lab 12/09/17 2156  AST 30  ALT 41  ALKPHOS 104     Recent Labs  Lab 12/04/17 1052 12/09/17 2156 12/10/17 0431  WBC 8.8 11.9* 10.2  HGB 13.8 11.5* 9.9*  HCT 41.4 34.6* 29.4*  PLT 363 337 272   Recent Labs  Lab 12/04/17 1052  APTT 31  INR 0.96         Other tests/results:  EXAM: MRI LUMBAR SPINE WITHOUT AND WITH CONTRAST  TECHNIQUE: Multiplanar and multiecho pulse sequences of the lumbar spine were obtained without and with intravenous contrast.  CONTRAST:  49m MULTIHANCE GADOBENATE DIMEGLUMINE 529 MG/ML IV  SOLN  COMPARISON:  Prior MRI from 08/24/2017.  FINDINGS: Segmentation: Normal segmentation. Lowest well-formed disc labeled the L5-S1 level.  Alignment: Straightening of the normal lumbar lordosis. Trace 3 mm anterolisthesis of L4 on L5, stable.  Vertebrae: Susceptibility artifact from recent PLIF at the L4-5 level. Vertebral body heights maintained without evidence for acute or chronic fracture. Bone marrow signal intensity within normal limits. Few small benign hemangiomas noted. No worrisome osseous lesions. No findings to suggest osteomyelitis discitis.  Conus medullaris and cauda equina: Conus extends to the L1-2 level. Conus and cauda equina appear normal.  Paraspinal and other soft tissues: Postoperative changes within the lower paraspinous soft tissues related to recent decompression with fusion. Small T2 hyperintense collections present along the paramidline incisions at the level of L3-4, favored to reflect normal postoperative seromas, although superimposed infection could be considered in the correct clinical setting. These measure up to approximately 2.4 cm on the left (series 9, image 29) and 1.8 cm on the right series 9, image 31). Subfascial extension of fluid on the right towards the laminectomy site (series 9, image 29). Mild surrounding edema and enhancement within the adjacent posterior paraspinous musculature, greater on the right.  Paraspinous soft tissues demonstrate no other acute abnormality. Visualized visceral structures within normal limits.  Disc levels:  T10-11: Mild disc bulging with at most mild spinal stenosis. Mild facet hypertrophy. Appearance is stable.  T11-12: Unremarkable.  T12-L1: Small right extraforaminal  disc protrusion without stenosis or impingement, stable.  L1-2:  Unremarkable.  L2-3:  Unremarkable.  L3-4: Mild circumferential disc bulge with disc desiccation. Moderate left foraminal/extraforaminal disc  protrusion again seen, similar to previous. Facet and ligament flavum hypertrophy. Small epidural collection extending from below related to recent surgery. Slightly worsened mild spinal stenosis as compared to previous. Borderline mild left neural foraminal narrowing relatively stable.  L4-5: Postoperative changes from interval posterior and interbody fusion, with right hemi laminectomy. Small amount of postoperative fluid at the laminectomy site, with slight cephalad extension into the dorsal epidural space (series 9, image 28). Findings mainly favored to be postoperative in nature. No other evidence for intraspinal infection or complication. Persistent severe canal with bilateral lateral recess stenosis at this time, worse on the right. Mild left with moderate right L4 foraminal narrowing also similar.  L5-S1: Diffuse disc bulge with intervertebral disc space narrowing. Disc bulging asymmetric to the left. Moderate facet hypertrophy. No significant spinal stenosis. Moderate left neural foraminal narrowing.  IMPRESSION: 1. Postoperative changes from recent TLIF at L4-5. Small fluid collections along the paramidline incisions, with subfascial extension towards the laminectomy site on the right. Trace epidural fluid/collection at the dorsal epidural space at the level of L4. Findings favored to reflect normal expected postoperative changes. Superimposed infection would be difficult to exclude, and could be considered in the correct clinical setting. Persistent fairly severe spinal stenosis at the L4-5 level at this time. 2. Left foraminal/extraforaminal disc protrusion at L3-4, potentially affecting the left L3 nerve root, stable. 3. Disc bulge with facet hypertrophy at L5-S1 with resultant moderate left L5 foraminal stenosis, also unchanged.   Electronically Signed   By: Jeannine Boga M.D.   On: 12/10/2017 01:28   Assessment/Plan:  [name] is admitted  after experiencing a fever on POD5 following L4-5 TLIF. IV antibiotics were administered and fever has subsided. Denies any other issue in relation to potential infection including wound drainage as well as any recently sick contact. Dr. Lacinda Axon has reviewed imaging without concerning findings. Recommends ESR and CRP to establish baseline if symptoms continue. Also recommends oral antibiotics for 2 weeks. Will monitor outpatient pain level and increase pain medication to 5-62m every four hours PRN if needed. Patient expresses understanding of plan and is agreeable.   AMarin OlpPA-C Department of Neurosurgery

## 2017-12-10 NOTE — Evaluation (Signed)
Physical Therapy Evaluation Patient Details Name: Bailey Flores MRN: [id number] DOB: 25-Nov-1960 Today's Date: 12/10/2017   History of Present Illness  57 y.o. female presented to ED for fever with PMH of osteoarthritis, COPD, anxiety/depression disorder, hypertension and other comorbidities.  Patient is status post recent lumbar fusion done on December 05, 2017 (L4/L5 TLIF)  Clinical Impression  Patient A&Ox4 at start of session, states her low back pain is 7/10. Per patient, she lives in a one story house with spouse and children, and was ambulating independently, though she needed assistance for bathing to adhere to back precautions. The patient demonstrated bed mobility and transfers with supervision, and ambulated ~228ft without AD, with decreased ambulation speed, no LOB noted. Patient reports L leg pain/L hip numbness during ambulation, patient encouraged to speak with physician at next available time. Patient left in chair with all needs in reach and family at bedside. The patient would benefit from further skilled PT for further precaution education, to maximize strength, endurance, mobility, and independence, as well as to improve balance and activity tolerance.     Follow Up Recommendations Outpatient PT    Equipment Recommendations  None recommended by PT    Recommendations for Other Services       Precautions / Restrictions Precautions Precautions: Back Precaution Comments: no bending, arching, twisting, lifting; no brace Restrictions Weight Bearing Restrictions: No      Mobility  Bed Mobility Overal bed mobility: Modified Independent Bed Mobility: Rolling;Sidelying to Sit Rolling: Supervision Sidelying to sit: Supervision     Sit to sidelying: Supervision    Transfers Overall transfer level: Needs assistance Equipment used: None Transfers: Sit to/from Stand Sit to Stand: Supervision            Ambulation/Gait Ambulation/Gait assistance: Supervision Gait  Distance (Feet): 200 Feet Assistive device: None       General Gait Details: Patient demonstrates decreased gait speed, but no LOB, WFLs, reports that she was performing her baseline ambulation speed/quality  Stairs            Wheelchair Mobility    Modified Rankin (Stroke Patients Only)       Balance Overall balance assessment: Mild deficits observed, not formally tested                                           Pertinent Vitals/Pain Pain Assessment: 0-10 Pain Score: 7  Pain Location: Low back pain, incision Pain Descriptors / Indicators: Dull;Shooting Pain Intervention(s): Limited activity within patient's tolerance    Home Living Family/patient expects to be discharged to:: Private residence Living Arrangements: Spouse/significant other;Children Available Help at Discharge: Family;Available 24 hours/day Type of Home: House Home Access: Stairs to enter Entrance Stairs-Rails: Psychiatric nurse of Steps: 3 Home Layout: One level Home Equipment: Cane - single point;Crutches;Shower seat;Walker - 4 wheels;Cane - quad;Adaptive equipment      Prior Function Level of Independence: Needs assistance   Gait / Transfers Assistance Needed: Walking without AD   ADL's / Homemaking Assistance Needed: assistance needed with bathing to adhere to precautions  Comments: No falls per patient     Hand Dominance   Dominant Hand: Right    Extremity/Trunk Assessment   Upper Extremity Assessment Upper Extremity Assessment: Overall WFL for tasks assessed    Lower Extremity Assessment Lower Extremity Assessment: RLE deficits/detail;LLE deficits/detail RLE Deficits / Details: 4/5 grossly LLE Deficits / Details: 4/5 grossly  Cervical / Trunk Assessment Cervical / Trunk Assessment: Normal  Communication   Communication: No difficulties  Cognition Arousal/Alertness: Awake/alert Behavior During Therapy: WFL for tasks assessed/performed                                           General Comments      Exercises Other Exercises Other Exercises: Patient demonstrated great adherence to BLTs during session, no cues from PT needed   Assessment/Plan    PT Assessment Patient needs continued PT services  PT Problem List Decreased strength;Decreased range of motion;Decreased activity tolerance;Decreased balance;Decreased mobility;Decreased coordination;Decreased cognition;Decreased knowledge of use of DME;Decreased safety awareness;Decreased knowledge of precautions;Pain       PT Treatment Interventions DME instruction;Gait training;Stair training;Functional mobility training;Therapeutic exercise;Therapeutic activities;Balance training;Neuromuscular re-education;Patient/family education    PT Goals (Current goals can be found in the Care Plan section)  Acute Rehab PT Goals Patient Stated Goal: to get home, get back to PLOF PT Goal Formulation: With patient Time For Goal Achievement: 12/24/17 Potential to Achieve Goals: Good    Frequency Min 2X/week   Barriers to discharge        Co-evaluation               AM-PAC PT "6 Clicks" Daily Activity  Outcome Measure Difficulty turning over in bed (including adjusting bedclothes, sheets and blankets)?: A Little Difficulty moving from lying on back to sitting on the side of the bed? : A Little Difficulty sitting down on and standing up from a chair with arms (e.g., wheelchair, bedside commode, etc,.)?: A Little Help needed moving to and from a bed to chair (including a wheelchair)?: None Help needed walking in hospital room?: None Help needed climbing 3-5 steps with a railing? : None 6 Click Score: 21    End of Session Equipment Utilized During Treatment: Gait belt Activity Tolerance: Patient limited by pain Patient left: in chair;with call bell/phone within reach;with chair alarm set;with family/visitor present Nurse Communication: Mobility  status PT Visit Diagnosis: Muscle weakness (generalized) (M62.81);Difficulty in walking, not elsewhere classified (R26.2);Unsteadiness on feet (R26.81)    Time: 9937-1696 PT Time Calculation (min) (ACUTE ONLY): 23 min   Charges:   PT Evaluation $PT Eval Low Complexity: 1 Low PT Treatments $Therapeutic Activity: 8-22 mins   PT G Codes:       Lieutenant Diego PT, DPT 3:27 PM,12/10/17 [phone]

## 2017-12-10 NOTE — ED Notes (Signed)
Patient transported to MRI 

## 2017-12-10 NOTE — Progress Notes (Signed)
Pharmacy Antibiotic Note  Bailey Flores is a 57 y.o. female admitted on 12/09/2017 with sepsis.  Pharmacy has been consulted for Vancomycin and Zosyn dosing.  Plan: Vancomycin 1g IV every 18 hours.  Goal trough 15-20 mcg/mL. Zosyn 3.375g IV q8h (4 hour infusion). Vancomycin trough prior to 5th dose  Height: 4\' 10"  (147.3 cm) Weight: 135 lb (61.2 kg) IBW/kg (Calculated) : 40.9  Temp (24hrs), Avg:100.1 F (37.8 C), Min:99 F (37.2 C), Max:100.6 F (38.1 C)  Recent Labs  Lab 12/04/17 1052 12/09/17 2156  WBC 8.8 11.9*  CREATININE 0.67 0.58  LATICACIDVEN  --  1.1    Estimated Creatinine Clearance: 58.6 mL/min (by C-G formula based on SCr of 0.58 mg/dL).    Allergies  Allergen Reactions  . Hydrocodone-Acetaminophen Itching    Antimicrobials this admission: Vancomycin 7/21 >>  Zosyn 7/21 >>   Thank you for allowing pharmacy to be a part of this patient's care.  Paulina Fusi, PharmD, BCPS 12/10/2017 1:10 AM

## 2017-12-10 NOTE — ED Notes (Signed)
Pt returned from MRI. Pt states she would like both iv s removed and another placed in hand. Pt also requesting pain medication. md notified.

## 2017-12-10 NOTE — Progress Notes (Signed)
PHARMACIST - PHYSICIAN ORDER COMMUNICATION  CONCERNING: P&T Medication Policy on Herbal Medications  DESCRIPTION:  This patient's order for:  Milk Thistle  has been noted.  This product(s) is classified as an "herbal" or natural product. Due to a lack of definitive safety studies or FDA approval, nonstandard manufacturing practices, plus the potential risk of unknown drug-drug interactions while on inpatient medications, the Pharmacy and Therapeutics Committee does not permit the use of "herbal" or natural products of this type within [facility].   ACTION TAKEN: The pharmacy department is unable to verify this order at this time and your patient has been informed of this safety policy. Please reevaluate patient's clinical condition at discharge and address if the herbal or natural product(s) should be resumed at that time.  Paulina Fusi, PharmD, BCPS 12/10/2017 3:26 AM

## 2017-12-11 LAB — URINE CULTURE: Culture: NO GROWTH

## 2017-12-11 LAB — C-REACTIVE PROTEIN: CRP: 21.3 mg/dL — ABNORMAL HIGH (ref <1.0)

## 2017-12-11 LAB — SEDIMENTATION RATE: Sed Rate: 107 mm/h — ABNORMAL HIGH (ref 0–30)

## 2017-12-11 LAB — HIV ANTIBODY (ROUTINE TESTING W REFLEX): HIV Screen 4th Generation wRfx: NONREACTIVE

## 2017-12-11 LAB — GLUCOSE, CAPILLARY: Glucose-Capillary: 106 mg/dL — ABNORMAL HIGH (ref 70–99)

## 2017-12-11 MED ORDER — LEVOFLOXACIN 750 MG PO TABS: 750.0000 mg | ORAL_TABLET | Freq: Every day | ORAL | 0 refills | Status: DC

## 2017-12-11 MED ORDER — CEPHALEXIN 500 MG PO CAPS: 500.0000 mg | ORAL_CAPSULE | Freq: Two times a day (BID) | ORAL | 0 refills | Status: AC

## 2017-12-11 NOTE — Discharge Summary (Signed)
Bailey Flores, is a 57 y.o. female  DOB 1960/12/05  MRN [id number].  Admission date:  12/09/2017  Admitting Physician  Amelia Jo, MD  Discharge Date:  12/11/2017   Primary MD  [name], MD  Recommendations for primary care physician for things to follow:  Follow-up with PCP in 1 week Follow-up with neurosurgery, Dr. Cari Caraway 10 days   Admission Diagnosis  Sepsis, due to unspecified organism [facility]) [A41.9] Fever, unspecified fever cause [R50.9]   Discharge Diagnosis  Sepsis, due to unspecified organism ([place]) [A41.9] Fever, unspecified fever cause [R50.9]   Active Problems:   Sepsis [facility])      Past Medical History:  Diagnosis Date  . Arthritis    hands  . Asthma   . Barrett's esophagus   . Cancer [facility])    neuroendocrine ca  . COPD (chronic obstructive pulmonary disease) ([place])   . Depression   . GERD (gastroesophageal reflux disease)   . Hypertension   . Motion sickness    reading in car  . 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 Pneumonia 2017  . Sleep apnea     Past Surgical History:  Procedure Laterality Date  . ABDOMINAL HYSTERECTOMY    . BOWEL RESECTION  07/19/2012  . CESAREAN SECTION     x3  . ESOPHAGOGASTRODUODENOSCOPY (EGD) WITH PROPOFOL N/A 03/05/2017   Procedure: ESOPHAGOGASTRODUODENOSCOPY (EGD) WITH PROPOFOL;  Surgeon: [name], MD;  Location: [facility];  Service: Endoscopy;  Laterality: N/A;  . ESOPHAGOGASTRODUODENOSCOPY (EGD) WITH PROPOFOL N/A 03/30/2017   REPEAT IN 03/2019  . HAND SURGERY Right   . TRANSFORAMINAL LUMBAR INTERBODY FUSION (TLIF) WITH PEDICLE SCREW FIXATION 1 LEVEL N/A 12/05/2017   Procedure: TRANSFORAMINAL LUMBAR INTERBODY FUSION (TLIF) WITH PEDICLE SCREW FIXATION 1 LEVEL-L4-5;  Surgeon: Meade Maw, MD;  Location: [facility] ORS;  Service:  Neurosurgery;  Laterality: N/A;       History of present illness and  Hospital Course:     Kindly see H&P for history of present illness and admission details, please review complete Labs, Consult reports and Test reports for all details in brief  HPI  from the history and physical done on the day of admission 57 year old female patient with history of osteoarthritis, COPD, anxiety, depression, essential hypertension had recent back surgery on July 17 with lumbar fusion by Dr. Cari Caraway from neurosurgery.  Patient admitted because of fever, sepsis.   Hospital Course  1 sepsis with fever up to 102 Fahrenheit at home and cough.  Because of recent back surgery patient admitted for further evaluation of postop fever.  Patient WBC elevated up to 11.9, lactic acid 1.1 on admission.  Chest x-ray did not show any pneumonia.  Patient received vancomycin, Zosyn, seen by neurosurgery, patient had MRI of her LS spine because of her history of L4-L5 TLIF and she is postop day 5.  Patient had fever, chills at home,.  Patient incision site is clean MRI of LS spine showed postoperative changes but unable to exclude subtle superimposed infection.  So neurosurgery recommended ESR, CRP, need antibiotic for 2 weeks.  Patient has no drainage from the wound.  Patient can continue oxycodone as given and recommended by neurosurgery.  Patient understands that she needs antibiotic for 2 weeks and follow-up with neurosurgery Dr. Cari Caraway 10 days or as scheduled.  Patient is afebrile, more dynamically stable now.  Discharging home with Keflex 500 mg p.o. twice daily for 14 days..  WBC improved to 10.2, kidney function and other labs are stable.  Patient ESR as  r recommended by neurosurgery shows reading of 107.    Discharge Condition: Stable   Follow UP  Follow-up Information    Feldpausch, Chrissie Noa, MD. Schedule an appointment as soon as possible for a visit in 1 week(s).   Specialty:  Family Medicine Contact  information: [address] [place] [id number] [phone]             Discharge Instructions  and  Discharge Medications     Allergies as of 12/11/2017      Reactions   Hydrocodone-acetaminophen Itching      Medication List    TAKE these medications   albuterol 108 (90 Base) MCG/ACT inhaler Commonly known as:  PROVENTIL HFA;VENTOLIN HFA Inhale 1-2 puffs into the lungs every 4 (four) hours as needed for wheezing or shortness of breath.   buPROPion 200 MG 12 hr tablet Commonly known as:  WELLBUTRIN SR Take 1 tablet (200 mg total) by mouth 2 (two) times daily.   celecoxib 100 MG capsule Commonly known as:  CELEBREX Take 1 capsule (100 mg total) by mouth 2 (two) times daily.   CENTRUM SILVER PO Take 1 tablet by mouth daily.   cephALEXin 500 MG capsule Commonly known as:  KEFLEX Take 1 capsule (500 mg total) by mouth 2 (two) times daily for 10 days.   Fluticasone-Salmeterol 250-50 MCG/DOSE Aepb Commonly known as:  ADVAIR DISKUS Inhale 1 puff into the lungs 2 (two) times daily. What changed:  when to take this   gabapentin 300 MG capsule Commonly known as:  NEURONTIN Take 300 mg 3 (three) times daily by mouth.   ipratropium-albuterol 0.5-2.5 (3) MG/3ML Soln Commonly known as:  DUONEB Inhale 3 mLs into the lungs every 4 (four) hours as needed (Only uses with cold symptoms).   lamoTRIgine 100 MG tablet Commonly known as:  LAMICTAL Take 1-2 tablets (100-200 mg total) by mouth daily. 172m qam and 200 mg qpm What changed:    when to take this  additional instructions   lisdexamfetamine 70 MG capsule Commonly known as:  VYVANSE Take 1 capsule (70 mg total) by mouth daily.   losartan-hydrochlorothiazide 50-12.5 MG tablet Commonly known as:  HYZAAR Take 1 tablet by mouth at bedtime.   lubiprostone 24 MCG capsule Commonly known as:  AMITIZA Take 24 mcg by mouth daily with breakfast.   MAGNESIUM PO Take 500 mg by mouth every evening.   methocarbamol  750 MG tablet Commonly known as:  ROBAXIN Take 1 tablet (750 mg total) by mouth every 6 (six) hours.   milk thistle 175 MG tablet Take 175 mg by mouth every evening.   montelukast 10 MG tablet Commonly known as:  SINGULAIR Take 10 mg by mouth at bedtime.   omeprazole 40 MG capsule Commonly known as:  PRILOSEC Take 40 mg by mouth daily.   orlistat 60 MG capsule Commonly known as:  ALLI Take 60 mg by mouth 2 (two) times daily with a meal.   oxyCODONE 5 MG immediate release tablet Commonly known as:  Oxy IR/ROXICODONE Take 1 tablet (5 mg total) by mouth every 3 (three) hours as needed for moderate pain ((score 4 to 6)).   SPIRIVA RESPIMAT 2.5 MCG/ACT Aers Generic drug:  Tiotropium Bromide Monohydrate Inhale 2 puffs into the lungs every morning.         Diet and Activity recommendation: See Discharge Instructions above   Consults obtained - neurosurgery   Major procedures and Radiology Reports - PLEASE review detailed  and final reports for all details, in brief -      Dg Lumbar Spine 2-3 Views  Result Date: 12/05/2017 CLINICAL DATA:  Status post lumbar fusion EXAM: LUMBAR SPINE - 2-3 VIEW COMPARISON:  None. FINDINGS: Status post PLIF at L4-5. Surgical hardware is in satisfactory position. Associated surgical drain. IMPRESSION: Status post PLIF at L4-5, without evidence of complication. Electronically Signed   By: Julian Hy M.D.   On: 12/05/2017 18:57   Dg Lumbar Spine 2-3 Views  Result Date: 12/05/2017 CLINICAL DATA:  Intraoperative imaging for L4-5 discectomy and fusion. EXAM: LUMBAR SPINE - 2-3 VIEW; DG C-ARM 61-120 MIN COMPARISON:  MRI lumbar spine 08/24/2017. FINDINGS: We are provided with 6 intraoperative fluoroscopic spot views of the lower lumbar spine. Images demonstrate localization of the L4-5 level and placement and interbody spacer. No acute abnormality is identified. IMPRESSION: Intraoperative imaging for L4-5 discectomy. Electronically Signed   By:  Inge Rise M.D.   On: 12/05/2017 11:48   Mr Lumbar Spine W Wo Contrast  Result Date: 12/10/2017 CLINICAL DATA:  Initial evaluation for acute back pain, concern for infection. Recent lumbar fusion. EXAM: MRI LUMBAR SPINE WITHOUT AND WITH CONTRAST TECHNIQUE: Multiplanar and multiecho pulse sequences of the lumbar spine were obtained without and with intravenous contrast. CONTRAST:  63m MULTIHANCE GADOBENATE DIMEGLUMINE 529 MG/ML IV SOLN COMPARISON:  Prior MRI from 08/24/2017. FINDINGS: Segmentation: Normal segmentation. Lowest well-formed disc labeled the L5-S1 level. Alignment: Straightening of the normal lumbar lordosis. Trace 3 mm anterolisthesis of L4 on L5, stable. Vertebrae: Susceptibility artifact from recent PLIF at the L4-5 level. Vertebral body heights maintained without evidence for acute or chronic fracture. Bone marrow signal intensity within normal limits. Few small benign hemangiomas noted. No worrisome osseous lesions. No findings to suggest osteomyelitis discitis. Conus medullaris and cauda equina: Conus extends to the L1-2 level. Conus and cauda equina appear normal. Paraspinal and other soft tissues: Postoperative changes within the lower paraspinous soft tissues related to recent decompression with fusion. Small T2 hyperintense collections present along the paramidline incisions at the level of L3-4, favored to reflect normal postoperative seromas, although superimposed infection could be considered in the correct clinical setting. These measure up to approximately 2.4 cm on the left (series 9, image 29) and 1.8 cm on the right series 9, image 31). Subfascial extension of fluid on the right towards the laminectomy site (series 9, image 29). Mild surrounding edema and enhancement within the adjacent posterior paraspinous musculature, greater on the right. Paraspinous soft tissues demonstrate no other acute abnormality. Visualized visceral structures within normal limits. Disc levels:  T10-11: Mild disc bulging with at most mild spinal stenosis. Mild facet hypertrophy. Appearance is stable. T11-12: Unremarkable. T12-L1: Small right extraforaminal disc protrusion without stenosis or impingement, stable. L1-2:  Unremarkable. L2-3:  Unremarkable. L3-4: Mild circumferential disc bulge with disc desiccation. Moderate left foraminal/extraforaminal disc protrusion again seen, similar to previous. Facet and ligament flavum hypertrophy. Small epidural collection extending from below related to recent surgery. Slightly worsened mild spinal stenosis as compared to previous. Borderline mild left neural foraminal narrowing relatively stable. L4-5: Postoperative changes from interval posterior and interbody fusion, with right hemi laminectomy. Small amount of postoperative fluid at the laminectomy site, with slight cephalad extension into the dorsal epidural space (series 9, image 28). Findings mainly favored to be postoperative in nature. No other evidence for intraspinal infection or complication. Persistent severe canal with bilateral lateral recess stenosis at this time, worse on the right. Mild left with moderate right  L4 foraminal narrowing also similar. L5-S1: Diffuse disc bulge with intervertebral disc space narrowing. Disc bulging asymmetric to the left. Moderate facet hypertrophy. No significant spinal stenosis. Moderate left neural foraminal narrowing. IMPRESSION: 1. Postoperative changes from recent TLIF at L4-5. Small fluid collections along the paramidline incisions, with subfascial extension towards the laminectomy site on the right. Trace epidural fluid/collection at the dorsal epidural space at the level of L4. Findings favored to reflect normal expected postoperative changes. Superimposed infection would be difficult to exclude, and could be considered in the correct clinical setting. Persistent fairly severe spinal stenosis at the L4-5 level at this time. 2. Left foraminal/extraforaminal disc  protrusion at L3-4, potentially affecting the left L3 nerve root, stable. 3. Disc bulge with facet hypertrophy at L5-S1 with resultant moderate left L5 foraminal stenosis, also unchanged. Electronically Signed   By: Jeannine Boga M.D.   On: 12/10/2017 01:28   Dg Chest Port 1 View  Result Date: 12/09/2017 CLINICAL DATA:  Fever today.  History of back surgery on 12/05/2017 EXAM: PORTABLE CHEST 1 VIEW COMPARISON:  CT chest 04/21/2017.  Chest 10/22/2015. FINDINGS: Normal heart size and pulmonary vascularity. No focal airspace disease or consolidation in the lungs. No blunting of costophrenic angles. No pneumothorax. Mediastinal contours appear intact. Degenerative changes in the shoulders. IMPRESSION: No active disease. Electronically Signed   By: Lucienne Capers M.D.   On: 12/09/2017 22:47   Dg C-arm 1-60 Min  Result Date: 12/05/2017 CLINICAL DATA:  Intraoperative imaging for L4-5 discectomy and fusion. EXAM: LUMBAR SPINE - 2-3 VIEW; DG C-ARM 61-120 MIN COMPARISON:  MRI lumbar spine 08/24/2017. FINDINGS: We are provided with 6 intraoperative fluoroscopic spot views of the lower lumbar spine. Images demonstrate localization of the L4-5 level and placement and interbody spacer. No acute abnormality is identified. IMPRESSION: Intraoperative imaging for L4-5 discectomy. Electronically Signed   By: Inge Rise M.D.   On: 12/05/2017 11:48   Dg C-arm 1-60 Min-no Report  Result Date: 12/05/2017 Fluoroscopy was utilized by the requesting physician.  No radiographic interpretation.   Dg C-arm 1-60 Min-no Report  Result Date: 12/05/2017 Fluoroscopy was utilized by the requesting physician.  No radiographic interpretation.    Micro Results     Recent Results (from the past 240 hour(s))  Surgical pcr screen     Status: None   Collection Time: 12/04/17 10:52 AM  Result Value Ref Range Status   MRSA, PCR NEGATIVE NEGATIVE Final   Staphylococcus aureus NEGATIVE NEGATIVE Final    Comment:  (NOTE) The Xpert SA Assay (FDA approved for NASAL specimens in patients 61 years of age and older), is one component of a comprehensive surveillance program. It is not intended to diagnose infection nor to guide or monitor treatment. Performed at [facility], [address] [place], [place] [address]   Blood Culture (routine x 2)     Status: None (Preliminary result)   Collection Time: 12/09/17  9:56 PM  Result Value Ref Range Status   Specimen Description BLOOD RIGHT ANTECUBITAL  Final   Special Requests   Final    BOTTLES DRAWN AEROBIC AND ANAEROBIC Blood Culture adequate volume   Culture   Final    NO GROWTH 2 DAYS Performed at [facility], [address], [place],  [address]    Report Status PENDING  Incomplete  Blood Culture (routine x 2)     Status: None (Preliminary result)   Collection Time: 12/09/17  9:56 PM  Result Value Ref Range Status  Specimen Description BLOOD LEFT ANTECUBITAL  Final   Special Requests   Final    BOTTLES DRAWN AEROBIC AND ANAEROBIC Blood Culture adequate volume   Culture   Final    NO GROWTH 2 DAYS Performed at [facility], [address], [place], [place] [address]    Report Status PENDING  Incomplete       Today   Subjective:   Bailey Flores today has no fever, eager to go home.  Objective:   Blood pressure 105/61, pulse 93, temperature 98.7 F (37.1 C), temperature source Oral, resp. rate 17, height 4' 10.5" (1.486 m), weight 62.4 kg (137 lb 8 oz), SpO2 93 %.   Intake/Output Summary (Last 24 hours) at 12/11/2017 0729 Last data filed at 12/10/2017 1903 Gross per 24 hour  Intake 2336.46 ml  Output -  Net 2336.46 ml    Exam Awake Alert, Oriented x 3, No new F.N deficits, Normal affect [place].AT,PERRAL Supple Neck,No JVD, No cervical lymphadenopathy appriciated.  Symmetrical Chest wall movement, Good air movement bilaterally, CTAB RRR,No Gallops,Rubs or new Murmurs, No Parasternal Heave +ve  B.Sounds, Abd Soft, Non tender, No organomegaly appriciated, No rebound -guarding or rigidity. No Cyanosis, Clubbing or edema, No new Rash or bruise Patient examination of the back incision site is intact has some back pain due to recent surgery but otherwise feels well. Data Review   CBC w Diff:  Lab Results  Component Value Date   WBC 10.2 12/10/2017   HGB 9.9 (L) 12/10/2017   HCT 29.4 (L) 12/10/2017   PLT 272 12/10/2017   LYMPHOPCT 11 12/09/2017   MONOPCT 8 12/09/2017   EOSPCT 1 12/09/2017   BASOPCT 0 12/09/2017    CMP:  Lab Results  Component Value Date   NA 139 12/10/2017   K 3.7 12/10/2017   CL 108 12/10/2017   CO2 26 12/10/2017   BUN 14 12/10/2017   CREATININE 0.55 12/10/2017   PROT 7.2 12/09/2017   ALBUMIN 3.5 12/09/2017   BILITOT 0.7 12/09/2017   ALKPHOS 104 12/09/2017   AST 30 12/09/2017   ALT 41 12/09/2017  .   Total Time in preparing paper work, data evaluation and todays exam - 35 minutes  Epifanio Lesches M.D on 12/11/2017 at 7:29 AM    Note: This dictation was prepared with Dragon dictation along with smaller phrase technology. Any transcriptional errors that result from this process are unintentional.

## 2017-12-11 NOTE — Care Management Note (Signed)
Case Management Note  Patient Details  Name: Bailey Flores MRN: [id number] Date of Birth: 10-Dec-1960  Subjective/Objective:  Met with patient. She states she is active with [facility] for RN and PT.  Notified Joelene Millin with [facility] of discharge` Resumption of care orders in. Patient agrees with POC. No DME needs.                 Action/Plan:   Expected Discharge Date:  12/11/17               Expected Discharge Plan:  [place]  In-House Referral:     Discharge planning Services  CM Consult  Post Acute Care Choice:  Home Health, Resumption of Svcs/PTA Provider Choice offered to:  Patient  DME Arranged:    DME Agency:     HH Arranged:  RN, PT HH Agency:  [facility]  Status of Service:  Completed, signed off  If discussed at [place] of Stay Meetings, dates discussed:    Additional Comments:  Jolly Mango, RN 12/11/2017, 9:19 AM

## 2017-12-11 NOTE — Progress Notes (Signed)
Discharge instructions and prescriptions reviewed with patient. Patient notified of MD appointment status. [facility] to resume care at home. IV removed without complications. Awaiting transport via husband.

## 2017-12-14 LAB — CULTURE, BLOOD (ROUTINE X 2)
CULTURE: NO GROWTH
Culture: NO GROWTH
SPECIAL REQUESTS: ADEQUATE
SPECIAL REQUESTS: ADEQUATE

## 2017-12-19 ENCOUNTER — Ambulatory Visit: Admitting: Psychiatry

## 2017-12-19 ENCOUNTER — Other Ambulatory Visit

## 2017-12-19 ENCOUNTER — Ambulatory Visit: Admitting: Licensed Clinical Social Worker

## 2018-01-02 ENCOUNTER — Ambulatory Visit (INDEPENDENT_AMBULATORY_CARE_PROVIDER_SITE_OTHER): Admitting: Psychiatry

## 2018-01-02 ENCOUNTER — Encounter: Payer: Self-pay | Admitting: Psychiatry

## 2018-01-02 ENCOUNTER — Ambulatory Visit: Admitting: Licensed Clinical Social Worker

## 2018-01-02 ENCOUNTER — Other Ambulatory Visit: Payer: Self-pay

## 2018-01-02 VITALS — BP 144/87 | HR 86 | Temp 97.8°F | Wt 135.6 lb

## 2018-01-02 DIAGNOSIS — F3131 Bipolar disorder, current episode depressed, mild: Secondary | ICD-10-CM | POA: Diagnosis not present

## 2018-01-02 DIAGNOSIS — F900 Attention-deficit hyperactivity disorder, predominantly inattentive type: Secondary | ICD-10-CM | POA: Diagnosis not present

## 2018-01-02 MED ORDER — LISDEXAMFETAMINE DIMESYLATE 70 MG PO CAPS: 70.0000 mg | ORAL_CAPSULE | Freq: Every day | ORAL | 0 refills | Status: DC

## 2018-01-02 MED ORDER — LAMOTRIGINE 100 MG PO TABS: 100.0000 mg | ORAL_TABLET | Freq: Two times a day (BID) | ORAL | 0 refills | Status: DC

## 2018-01-02 MED ORDER — BUPROPION HCL ER (SR) 200 MG PO TB12: 200.0000 mg | ORAL_TABLET | Freq: Two times a day (BID) | ORAL | 0 refills | Status: DC

## 2018-01-02 NOTE — Progress Notes (Signed)
[facility] OP Progress Note  01/02/2018 1:08 PM Bailey Flores  MRN:  [id number]  Chief Complaint: ' I am here for follow up." Chief Complaint    Follow-up     HPI: [name] is a 57 year old female who is married, unemployed, has a history of bipolar disorder, ADHD, cognitive disorder, COPD, hypertension, chronic pain, presented to the clinic today for a follow-up visit.  Patient today reports she recently had a back surgery-fusion surgery.  Patient reports she did well for a few days but after that pulled a muscle and started having severe pain.  Patient currently reports she takes pain medication and a muscle relaxant which has been helpful.  Patient hence has been restless at night due to pain however reports it as improving and sleep is getting better now.  Patient reports her mood as improving since the past week or so.  She reports her husband has been extremely supportive and that has helped her a lot.  Patient reports she continues to be compliant with her medications as prescribed.  She reports she would like to go down on the Lamictal if possible since she wants to be on lesser amount of medications as much as possible.  Discussed with patient it is okay to reduce the Lamictal dosage to 200 mg a day.  Patient as well as family to monitor her and if her symptoms worsen she can be re-titrated back to her current dosage.  Patient continues to be compliant with Vyvanse.  She denies any side effects.  Patient continues to have some psychosocial stressors of her daughter who struggles with mental health issues.  She reports the house is peaceful right now since she is out of the house which has also helped her mood symptoms to get better.  Visit Diagnosis:    ICD-10-CM   1. Bipolar disorder, current episode depressed, mild (HCC) F31.31 buPROPion (WELLBUTRIN SR) 200 MG 12 hr tablet    lamoTRIgine (LAMICTAL) 100 MG tablet  2. Attention deficit hyperactivity disorder (ADHD), predominantly  inattentive type F90.0 lisdexamfetamine (VYVANSE) 70 MG capsule    Past Psychiatric History: Reviewed past psychiatric history from my progress note on 08/10/2017.  Past Medical History:  Past Medical History:  Diagnosis Date  . Arthritis    hands  . Asthma   . Barrett's esophagus   . Cancer [facility])    neuroendocrine ca  . COPD (chronic obstructive pulmonary disease) ([place])   . Depression   . GERD (gastroesophageal reflux disease)   . Hypertension   . Motion sickness    reading in car  . 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 Pneumonia 2017  . Sleep apnea     Past Surgical History:  Procedure Laterality Date  . ABDOMINAL HYSTERECTOMY    . BOWEL RESECTION  07/19/2012  . CESAREAN SECTION     x3  . ESOPHAGOGASTRODUODENOSCOPY (EGD) WITH PROPOFOL N/A 03/05/2017   Procedure: ESOPHAGOGASTRODUODENOSCOPY (EGD) WITH PROPOFOL;  Surgeon: [name], MD;  Location: [facility];  Service: Endoscopy;  Laterality: N/A;  . ESOPHAGOGASTRODUODENOSCOPY (EGD) WITH PROPOFOL N/A 03/30/2017   REPEAT IN 03/2019  . HAND SURGERY Right   . TRANSFORAMINAL LUMBAR INTERBODY FUSION (TLIF) WITH PEDICLE SCREW FIXATION 1 LEVEL N/A 12/05/2017   Procedure: TRANSFORAMINAL LUMBAR INTERBODY FUSION (TLIF) WITH PEDICLE SCREW FIXATION 1 LEVEL-L4-5;  Surgeon: Meade Maw, MD;  Location: [facility];  Service: Neurosurgery;  Laterality: N/A;    Family Psychiatric History: Reviewed family psychiatric history from my progress note on 08/10/2017.  Family History:  Family History  Problem Relation Age of Onset  . Breast cancer Cousin        mat cousin    Social History: Reviewed social history from my progress note on 08/10/2017. Social History   Socioeconomic History  . Marital status: Married    Spouse name: Not on file  . Number of children: Not on file  . Years of education: Not on file  . Highest education level: Not on file  Occupational History  . Not on file  Social Needs  . Financial  resource strain: Not on file  . Food insecurity:    Worry: Not on file    Inability: Not on file  . Transportation needs:    Medical: Not on file    Non-medical: Not on file  Tobacco Use  . Smoking status: Former Smoker    Packs/day: 2.00    Years: 37.00    Pack years: 74.00    Types: Cigarettes    Last attempt to quit: 07/19/2012    Years since quitting: 5.4  . Smokeless tobacco: Never Used  Substance and Sexual Activity  . Alcohol use: Yes    Comment: 2 drinks/mo  . Drug use: No  . Sexual activity: Yes  Lifestyle  . Physical activity:    Days per week: Not on file    Minutes per session: Not on file  . Stress: Not on file  Relationships  . Social connections:    Talks on phone: Not on file    Gets together: Not on file    Attends religious service: Not on file    Active member of club or organization: Not on file    Attends meetings of clubs or organizations: Not on file    Relationship status: Not on file  Other Topics Concern  . Not on file  Social History Narrative  . Not on file    Allergies:  Allergies  Allergen Reactions  . Hydrocodone-Acetaminophen Itching    Metabolic Disorder Labs: Lab Results  Component Value Date   HGBA1C 5.9 (H) 06/13/2017   MPG 122.63 06/13/2017   Lab Results  Component Value Date   PROLACTIN 4.6 (L) 06/13/2017   Lab Results  Component Value Date   CHOL 183 06/13/2017   TRIG 80 06/13/2017   HDL 92 06/13/2017   CHOLHDL 2.0 06/13/2017   VLDL 16 06/13/2017   LDLCALC 75 06/13/2017   Lab Results  Component Value Date   TSH 1.205 06/13/2017    Therapeutic Level Labs: No results found for: LITHIUM No results found for: VALPROATE No components found for:  CBMZ  Current Medications: Current Outpatient Medications  Medication Sig Dispense Refill  . albuterol (PROVENTIL HFA;VENTOLIN HFA) 108 (90 Base) MCG/ACT inhaler Inhale 1-2 puffs into the lungs every 4 (four) hours as needed for wheezing or shortness of breath.      Marland Kitchen buPROPion (WELLBUTRIN SR) 200 MG 12 hr tablet Take 1 tablet (200 mg total) by mouth 2 (two) times daily. 180 tablet 0  . celecoxib (CELEBREX) 100 MG capsule Take 1 capsule (100 mg total) by mouth 2 (two) times daily. 60 capsule 0  . Fluticasone-Salmeterol (ADVAIR DISKUS) 250-50 MCG/DOSE AEPB Inhale 1 puff into the lungs 2 (two) times daily. (Patient taking differently: Inhale 1 puff into the lungs daily. ) 180 each 1  . gabapentin (NEURONTIN) 300 MG capsule Take 300 mg 3 (three) times daily by mouth.     Marland Kitchen ipratropium-albuterol (DUONEB) 0.5-2.5 (3) MG/3ML SOLN Inhale 3 mLs  into the lungs every 4 (four) hours as needed (Only uses with cold symptoms).     Marland Kitchen lamoTRIgine (LAMICTAL) 100 MG tablet Take 1 tablet (100 mg total) by mouth 2 (two) times daily. 180 tablet 0  . lisdexamfetamine (VYVANSE) 70 MG capsule Take 1 capsule (70 mg total) by mouth daily. 90 capsule 0  . losartan-hydrochlorothiazide (HYZAAR) 50-12.5 MG tablet Take 1 tablet by mouth at bedtime.     Marland Kitchen lubiprostone (AMITIZA) 24 MCG capsule Take 24 mcg by mouth daily with breakfast.     . MAGNESIUM PO Take 500 mg by mouth every evening.     . methocarbamol (ROBAXIN) 750 MG tablet Take 1 tablet (750 mg total) by mouth every 6 (six) hours. 90 tablet `0  . milk thistle 175 MG tablet Take 175 mg by mouth every evening.     . montelukast (SINGULAIR) 10 MG tablet Take 10 mg by mouth at bedtime.     . Multiple Vitamins-Minerals (CENTRUM SILVER PO) Take 1 tablet by mouth daily.     Marland Kitchen omeprazole (PRILOSEC) 40 MG capsule Take 40 mg by mouth daily.     Marland Kitchen orlistat (ALLI) 60 MG capsule Take 60 mg by mouth 2 (two) times daily with a meal.    . oxyCODONE (OXY IR/ROXICODONE) 5 MG immediate release tablet Take 1 tablet (5 mg total) by mouth every 3 (three) hours as needed for moderate pain ((score 4 to 6)). 30 tablet 0  . SPIRIVA RESPIMAT 2.5 MCG/ACT AERS Inhale 2 puffs into the lungs every morning.      No current facility-administered medications for  this visit.      Musculoskeletal: Strength & Muscle Tone: within normal limits Gait & Station: normal Patient leans: N/A  Psychiatric Specialty Exam: Review of Systems  Musculoskeletal: Positive for back pain.  Psychiatric/Behavioral: The patient has insomnia (restless).   All other systems reviewed and are negative.   Blood pressure (!) 144/87, pulse 86, temperature 97.8 F (36.6 C), temperature source Oral, weight 135 lb 9.6 oz (61.5 kg).Body mass index is 27.86 kg/m.  General Appearance: Casual  Eye Contact:  Fair  Speech:  Normal Rate  Volume:  Normal  Mood:  Anxious, improving  Affect:  Congruent  Thought Process:  Goal Directed and Descriptions of Associations: Intact  Orientation:  Full (Time, Place, and Person)  Thought Content: Logical   Suicidal Thoughts:  No  Homicidal Thoughts:  No  Memory:  Immediate;   Fair Recent;   Fair Remote;   Fair  Judgement:  Fair  Insight:  Fair  Psychomotor Activity:  Normal  Concentration:  Concentration: Fair and Attention Span: Fair  Recall:  [employer] of Knowledge: Fair  Language: Fair  Akathisia:  No  Handed:  Right  AIMS (if indicated): na  Assets:  Communication Skills Desire for Improvement Social Support  ADL's:  Intact  Cognition: WNL  Sleep:  restless due to pain   Screenings: PHQ2-9     Pulmonary Rehab from 06/25/2017 in [facility]  PHQ-2 Total Score  4  PHQ-9 Total Score  19       Assessment and Plan: Lavell is a 57 year old Hispanic female who has a history of bipolar disorder, ADHD, cognitive disorder, COPD, GERD, presented to the clinic today for a follow-up visit.  Patient struggles with back pain status post fusion surgery.  Patient however is recovering and her mood symptoms reported as improving.  Patient will continue medications as noted below.  Plan  Bipolar disorder Reduce Lamictal to 200 mg p.o. daily.  Patient reports she would like her dosage reduced.  Patient as well  as family to monitor her symptoms closely. Continue Wellbutrin SR 200 mg p.o. twice daily Patient reports she is not on Abilify since it caused her side effects.  Anxiety symptoms Continue hydroxyzine 25 mg p.o.  as needed  Insomnia Continue melatonin. Reports her pain is improving which has been helpful with sleep.  For ADHD I have reviewed [place] controlled substance database. Continue Vyvanse 70 mg p.o. daily-90-day supply.  Pt with elevated blood pressure likely due to pain.  Will continue to monitor closely.  Follow-up in clinic in 3 months or sooner if needed.  More than 50 % of the time was spent for psychoeducation and supportive psychotherapy and care coordination.  This note was generated in part or whole with voice recognition software. Voice recognition is usually quite accurate but there are transcription errors that can and very often do occur. I apologize for any typographical errors that were not detected and corrected.       [name], MD 01/02/2018, 1:08 PM

## 2018-01-03 ENCOUNTER — Other Ambulatory Visit: Payer: Self-pay | Admitting: Internal Medicine

## 2018-01-16 ENCOUNTER — Ambulatory Visit (INDEPENDENT_AMBULATORY_CARE_PROVIDER_SITE_OTHER): Admitting: Licensed Clinical Social Worker

## 2018-01-16 DIAGNOSIS — F3131 Bipolar disorder, current episode depressed, mild: Secondary | ICD-10-CM

## 2018-01-24 ENCOUNTER — Inpatient Hospital Stay: Attending: Hematology and Oncology | Admitting: Hematology and Oncology

## 2018-01-24 ENCOUNTER — Encounter: Payer: Self-pay | Admitting: Hematology and Oncology

## 2018-01-24 ENCOUNTER — Other Ambulatory Visit: Payer: Self-pay

## 2018-01-24 ENCOUNTER — Inpatient Hospital Stay

## 2018-01-24 VITALS — BP 146/93 | HR 87 | Temp 96.3°F | Resp 18 | Wt 134.4 lb

## 2018-01-24 DIAGNOSIS — C7A8 Other malignant neuroendocrine tumors: Secondary | ICD-10-CM

## 2018-01-24 DIAGNOSIS — D3A8 Other benign neuroendocrine tumors: Secondary | ICD-10-CM

## 2018-01-24 LAB — CBC WITH DIFFERENTIAL/PLATELET
Basophils Absolute: 0.1 10*3/uL (ref 0–0.1)
Basophils Relative: 1 %
Eosinophils Absolute: 0.1 10*3/uL (ref 0–0.7)
Eosinophils Relative: 2 %
HCT: 40.1 % (ref 35.0–47.0)
Hemoglobin: 13 g/dL (ref 12.0–16.0)
Lymphocytes Relative: 43 %
Lymphs Abs: 2.4 10*3/uL (ref 1.0–3.6)
MCH: 28.3 pg (ref 26.0–34.0)
MCHC: 32.4 g/dL (ref 32.0–36.0)
MCV: 87.6 fL (ref 80.0–100.0)
Monocytes Absolute: 0.4 10*3/uL (ref 0.2–0.9)
Monocytes Relative: 8 %
Neutro Abs: 2.6 10*3/uL (ref 1.4–6.5)
Neutrophils Relative %: 46 %
Platelets: 381 10*3/uL (ref 150–440)
RBC: 4.58 MIL/uL (ref 3.80–5.20)
RDW: 14.1 % (ref 11.5–14.5)
WBC: 5.6 10*3/uL (ref 3.6–11.0)

## 2018-01-24 LAB — COMPREHENSIVE METABOLIC PANEL
ALT: 20 U/L (ref 0–44)
AST: 20 U/L (ref 15–41)
Albumin: 4.1 g/dL (ref 3.5–5.0)
Alkaline Phosphatase: 89 U/L (ref 38–126)
Anion gap: 8 (ref 5–15)
BUN: 36 mg/dL — ABNORMAL HIGH (ref 6–20)
CO2: 26 mmol/L (ref 22–32)
Calcium: 9.6 mg/dL (ref 8.9–10.3)
Chloride: 105 mmol/L (ref 98–111)
Creatinine, Ser: 0.63 mg/dL (ref 0.44–1.00)
GFR calc Af Amer: >60 mL/min (ref >60)
GFR calc non Af Amer: >60 mL/min (ref >60)
Glucose, Bld: 96 mg/dL (ref 70–99)
Potassium: 4.1 mmol/L (ref 3.5–5.1)
Sodium: 139 mmol/L (ref 135–145)
Total Bilirubin: 0.6 mg/dL (ref 0.3–1.2)
Total Protein: 7.2 g/dL (ref 6.5–8.1)

## 2018-01-24 NOTE — Progress Notes (Signed)
[facility] day:  01/24/2018   Chief Complaint: Bailey Flores is a 57 y.o. 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  HPI: The patient was last seen in the medical oncology clinic on 07/25/2017.  At that time, she was doing well.  She denied any flushing or diarrhea.  Exam was stable.  During the interim, she underwent L4-L5 fusion.  Surgery went well.  She notes that her back feels great, although she can't bend, lift or twist.  She denies any diarrhea or flushing.   Past Medical History:  Diagnosis Date  . Arthritis    hands  . Asthma   . Barrett's esophagus   . Cancer [facility])    neuroendocrine ca  . COPD (chronic obstructive pulmonary disease) ([place])   . Depression   . GERD (gastroesophageal reflux disease)   . Hypertension   . Motion sickness    reading in car  . 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 Pneumonia 2017  . Sleep apnea     Past Surgical History:  Procedure Laterality Date  . ABDOMINAL HYSTERECTOMY    . BOWEL RESECTION  07/19/2012  . CESAREAN SECTION     x3  . ESOPHAGOGASTRODUODENOSCOPY (EGD) WITH PROPOFOL N/A 03/05/2017   Procedure: ESOPHAGOGASTRODUODENOSCOPY (EGD) WITH PROPOFOL;  Surgeon: [name], MD;  Location: [facility];  Service: Endoscopy;  Laterality: N/A;  . ESOPHAGOGASTRODUODENOSCOPY (EGD) WITH PROPOFOL N/A 03/30/2017   REPEAT IN 03/2019  . HAND SURGERY Right   . TRANSFORAMINAL LUMBAR INTERBODY FUSION (TLIF) WITH PEDICLE SCREW FIXATION 1 LEVEL N/A 12/05/2017   Procedure: TRANSFORAMINAL LUMBAR INTERBODY FUSION (TLIF) WITH PEDICLE SCREW FIXATION 1 LEVEL-L4-5;  Surgeon: Meade Maw, MD;  Location: [facility];  Service: Neurosurgery;  Laterality: N/A;    Family History  Problem Relation Age of Onset  . Breast cancer Cousin        mat cousin    Social History:  reports that she quit smoking about 5 years ago. Her smoking use included cigarettes. She has a 74.00  pack-year smoking history. She has never used smokeless tobacco. She reports that she drinks alcohol. She reports that she does not use drugs.  She smoked 1 1/2 - 2 packs/day from the 81 (age 62) to 06/2012.  When she was younger, she only smoked 1/2 pack per day.  She is from [place].  She grew up in [place].  She previously lived in [facility], but sought medical attention in [facility] then [place].  She moved to [place] in 07/2016.  She lives in [place].  The patient is accompanied by her husband today.  Allergies:  Allergies  Allergen Reactions  . Hydrocodone-Acetaminophen Itching    Current Medications: Current Outpatient Medications  Medication Sig Dispense Refill  . ADVAIR DISKUS 250-50 MCG/DOSE AEPB USE 1 INHALATION TWICE A DAY 180 each 1  . albuterol (PROVENTIL HFA;VENTOLIN HFA) 108 (90 Base) MCG/ACT inhaler Inhale 1-2 puffs into the lungs every 4 (four) hours as needed for wheezing or shortness of breath.     Marland Kitchen buPROPion (WELLBUTRIN SR) 200 MG 12 hr tablet Take 1 tablet (200 mg total) by mouth 2 (two) times daily. 180 tablet 0  . celecoxib (CELEBREX) 100 MG capsule Take 1 capsule (100 mg total) by mouth 2 (two) times daily. 60 capsule 0  . gabapentin (NEURONTIN) 300 MG capsule Take 300 mg 3 (three) times daily by mouth.     Marland Kitchen  ipratropium-albuterol (DUONEB) 0.5-2.5 (3) MG/3ML SOLN Inhale 3 mLs into the lungs every 4 (four) hours as needed (Only uses with cold symptoms).     Marland Kitchen lamoTRIgine (LAMICTAL) 100 MG tablet Take 1 tablet (100 mg total) by mouth 2 (two) times daily. 180 tablet 0  . lisdexamfetamine (VYVANSE) 70 MG capsule Take 1 capsule (70 mg total) by mouth daily. 90 capsule 0  . losartan-hydrochlorothiazide (HYZAAR) 50-12.5 MG tablet Take 1 tablet by mouth at bedtime.     Marland Kitchen lubiprostone (AMITIZA) 24 MCG capsule Take 24 mcg by mouth daily with breakfast.     . MAGNESIUM PO Take 500 mg by mouth every evening.     . methocarbamol (ROBAXIN) 750 MG  tablet Take 1 tablet (750 mg total) by mouth every 6 (six) hours. 90 tablet `0  . milk thistle 175 MG tablet Take 175 mg by mouth every evening.     . montelukast (SINGULAIR) 10 MG tablet Take 10 mg by mouth at bedtime.     . Multiple Vitamins-Minerals (CENTRUM SILVER PO) Take 1 tablet by mouth daily.     Marland Kitchen omeprazole (PRILOSEC) 40 MG capsule Take 40 mg by mouth daily.     Marland Kitchen orlistat (ALLI) 60 MG capsule Take 60 mg by mouth 2 (two) times daily with a meal.    . oxyCODONE (OXY IR/ROXICODONE) 5 MG immediate release tablet Take 1 tablet (5 mg total) by mouth every 3 (three) hours as needed for moderate pain ((score 4 to 6)). 30 tablet 0  . SPIRIVA RESPIMAT 2.5 MCG/ACT AERS Inhale 2 puffs into the lungs every morning.     . Multiple Vitamins-Minerals (CENTRUM SILVER PO) Take 1 tablet by mouth daily.     No current facility-administered medications for this visit.     Review of Systems:  GENERAL:  Feels "better".  No fevers, sweats or weight loss.  Weight up 5 pounds. PERFORMANCE STATUS (ECOG):  1 HEENT:  No visual changes, runny nose, sore throat, mouth sores or tenderness. Lungs: Shortness of breath secondary to COPD.  No cough.  No hemoptysis. Cardiac:  No chest pain, palpitations, orthopnea, or PND. GI:  Taking pills on an empty stomach causes nausea.  No vomiting, diarrhea, constipation, melena or hematochezia.  Last colonoscopy 9 years ago. GU:  No urgency, frequency, dysuria, or hematuria. Musculoskeletal:  L4-L5 fusion.  Arthritis in hands and left shoulder.  No muscle tenderness. Extremities:  No pain or swelling. Skin:  No rashes or skin changes. Neuro:  No sciatica since surgery.  No headache, numbness or weakness, balance or coordination issues. Endocrine:  No diabetes, thyroid issues, hot flashes or night sweats. Psych:  No mood changes, depression or anxiety. Pain:  No focal pain. Review of systems:  All other systems reviewed and found to be negative.   Physical Exam: Blood  pressure (!) 146/93, pulse 87, temperature (!) 96.3 F (35.7 C), temperature source Tympanic, resp. rate 18, weight 134 lb 6 oz (61 kg). GENERAL:  Well developed, well nourished, woman sitting comfortably in the exam room in no acute distress. MENTAL STATUS:  Alert and oriented to person, place and time. HEAD:  Long dark hair.  Normocephalic, atraumatic, face symmetric, no Cushingoid features. EYES:  Glasses.  Brown eyes.  Pupils equal round and reactive to light and accomodation.  No conjunctivitis or scleral icterus. ENT:  Oropharynx clear without lesion.  Tongue normal. Mucous membranes moist.  RESPIRATORY:  Clear to auscultation without rales, wheezes or rhonchi. CARDIOVASCULAR:  Regular rate and  rhythm without murmur, rub or gallop. ABDOMEN:  Soft, non-tender, with active bowel sounds, and no hepatosplenomegaly.  No masses. SKIN:  No rashes, ulcers or lesions. EXTREMITIES: No edema, no skin discoloration or tenderness.  No palpable cords. LYMPH NODES: No palpable cervical, supraclavicular, axillary or inguinal adenopathy  NEUROLOGICAL: Unremarkable. PSYCH:  Appropriate.    Orders Only on 01/24/2018  Component Date Value Ref Range Status  . Sodium 01/24/2018 139  135 - 145 mmol/L Final  . Potassium 01/24/2018 4.1  3.5 - 5.1 mmol/L Final  . Chloride 01/24/2018 105  98 - 111 mmol/L Final  . CO2 01/24/2018 26  22 - 32 mmol/L Final  . Glucose, Bld 01/24/2018 96  70 - 99 mg/dL Final  . BUN 01/24/2018 36* 6 - 20 mg/dL Final  . Creatinine, Ser 01/24/2018 0.63  0.44 - 1.00 mg/dL Final  . Calcium 01/24/2018 9.6  8.9 - 10.3 mg/dL Final  . Total Protein 01/24/2018 7.2  6.5 - 8.1 g/dL Final  . Albumin 01/24/2018 4.1  3.5 - 5.0 g/dL Final  . AST 01/24/2018 20  15 - 41 U/L Final  . ALT 01/24/2018 20  0 - 44 U/L Final  . Alkaline Phosphatase 01/24/2018 89  38 - 126 U/L Final  . Total Bilirubin 01/24/2018 0.6  0.3 - 1.2 mg/dL Final  . GFR calc non Af Amer 01/24/2018 >60  >60 mL/min Final  . GFR  calc Af Amer 01/24/2018 >60  >60 mL/min Final   Comment: (NOTE) The eGFR has been calculated using the CKD EPI equation. This calculation has not been validated in all clinical situations. eGFR's persistently <60 mL/min signify possible Chronic Kidney Disease.   Georgiann Hahn gap 01/24/2018 8  5 - 15 Final   Performed at [facility], [address] [place], [place] [address]  . WBC 01/24/2018 5.6  3.6 - 11.0 K/uL Final  . RBC 01/24/2018 4.58  3.80 - 5.20 MIL/uL Final  . Hemoglobin 01/24/2018 13.0  12.0 - 16.0 g/dL Final  . HCT 01/24/2018 40.1  35.0 - 47.0 % Final  . MCV 01/24/2018 87.6  80.0 - 100.0 fL Final  . MCH 01/24/2018 28.3  26.0 - 34.0 pg Final  . MCHC 01/24/2018 32.4  32.0 - 36.0 g/dL Final  . RDW 01/24/2018 14.1  11.5 - 14.5 % Final  . Platelets 01/24/2018 381  150 - 440 K/uL Final  . Neutrophils Relative % 01/24/2018 46  % Final  . Neutro Abs 01/24/2018 2.6  1.4 - 6.5 K/uL Final  . Lymphocytes Relative 01/24/2018 43  % Final  . Lymphs Abs 01/24/2018 2.4  1.0 - 3.6 K/uL Final  . Monocytes Relative 01/24/2018 8  % Final  . Monocytes Absolute 01/24/2018 0.4  0.2 - 0.9 K/uL Final  . Eosinophils Relative 01/24/2018 2  % Final  . Eosinophils Absolute 01/24/2018 0.1  0 - 0.7 K/uL Final  . Basophils Relative 01/24/2018 1  % Final  . Basophils Absolute 01/24/2018 0.1  0 - 0.1 K/uL Final   Performed at [facility], [address], [place], [place] [address]    Assessment:  Bailey Flores is a 57 y.o. female with a history of bile duct tumor s/p resection in 07/19/2012 in [place], [place].    She underwent excision of the common hepatic duct, common bile duct, right hepatic duct, left hepatic duct, roux en Y hepaticojejunostomy to the right hepatic duct, and hepaticojejunostomy to the left hepatic duct.  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  Proximal and  distal margins were negative.  Lymph nodes were negative.  MRCP on 04/23/2012 in [place], [place], revealed features of choledocholithiasis with a 1.2 cm ovoid filling defect within the mid segment of the CBD with associated intrahepatic and proximal extrahepatic biliary ductal dilatation.  Abdomen and pelvic CT on 09/02/2012 revealed post-operative changes in the gallbladder fossa.  There was mildly distended pancreatic duct within the head of the pancreas without mass or adenopathy.  Abdominal ultrasound on 03/06/2017 revealed fatty infiltration of the liver.  She is status post cholecystectomy with no biliary distention.  Chromagranin A was 9 (0-5) on 04/18/2017.  24 hour quantitative 5-HIAA level was 4.9 (0.0-14.9) on 04/24/2017.  EGD on 03/05/2017 revealed a normal esophagus.  There was a large amount of food (residue) in the stomach.  There was gastritis. 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  There was no dysplasia or Helicobacter pylori.  Duodenum was normal.  EGD on 03/30/2017 revealed a normal esophagus.  There was gastritis. 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  There was no dysplasia or Helicobacter pylori.  Duodenum was normal.  Bilateral mammogram on 04/02/2017  revealed no mammographic evidence of malignancy.  Last colonoscopy was < 10 years ago.     She has a > 50 pack year smoking history (stopped smoking 2014).   Low-dose chest CT on 05/01/2018 was Lung-RADS 1 (negative).    Symptomatically, she is doing well s/p L4-L5 fusion.  She denies any diarrhea or flushing.  Exam is stable.  Plan: 1.  Labs today:  CBC with diff, CMP, chromogranin. 2.  Low grade neuroendocrine tumor:  Asymptomatic.  Discuss follow-up every 6-12 months.  Consideration for imaging (chest CT and abdomen CT with contrast) and biochemical markers as clinically indicated. 3.  Health maintenance:  Continue  annual low dose chest CT.  4.  RTC in 6 months for MD assessment and labs (CBC with diff, CMP, chromogranin A).   Nolon Stalls, MD 01/24/2018, 11:43 AM

## 2018-01-24 NOTE — Progress Notes (Signed)
Patient states she had back surgery in early July.  Doing well.  Offers no complaints today.

## 2018-01-25 LAB — CHROMOGRANIN A: Chromogranin A: 11 nmol/L — ABNORMAL HIGH (ref 0–5)

## 2018-01-30 ENCOUNTER — Ambulatory Visit: Admitting: Licensed Clinical Social Worker

## 2018-02-13 ENCOUNTER — Ambulatory Visit (INDEPENDENT_AMBULATORY_CARE_PROVIDER_SITE_OTHER): Admitting: Licensed Clinical Social Worker

## 2018-02-13 DIAGNOSIS — F3131 Bipolar disorder, current episode depressed, mild: Secondary | ICD-10-CM | POA: Diagnosis not present

## 2018-02-13 NOTE — Progress Notes (Signed)
   THERAPIST PROGRESS NOTE  Session Time: 45min  Participation Level: Active  Behavioral Response: CasualAlertDepressed  Type of Therapy: Individual Therapy  Treatment Goals addressed: Coping  Interventions: CBT and Motivational Interviewing  Summary: Bailey Flores is a 57 y.o. female who presents with continued symptoms of diagnosis. Patient is currently "overwhelmed" about her daughter being in jail.  Patient reports poor mood after talking to her daughter on the phone.  Patient reports that she wants her daughter to learn how to manage her symptoms without interference from parents. Patient began to cry while discussing daughter and trying to explore resolutions.  Encouraged patient to use mindfulness to cope with symptoms.  Taught grounding.  Suicidal/Homicidal: No  Plan: Return again in 2 weeks.  Diagnosis: Axis I: Bipolar, mixed    Axis II: No diagnosis    Lubertha South, LCSW 02/13/2018

## 2018-02-25 NOTE — Progress Notes (Signed)
   THERAPIST PROGRESS NOTE  Session Time: 49min  Participation Level: Active  Behavioral Response: CasualAlertIrritable  Type of Therapy: Individual Therapy  Treatment Goals addressed: Coping  Interventions: CBT and Motivational Interviewing  Summary: Bailey Flores is a 57 y.o. female who presents with continued symptoms of her diagnosis. Therapist engaged Client in a problem solving intervention regarding the behavior of her daughter as this worry continues to add to her current stressors. Therapist prompted Client to brainstorm all solutions of the behavior that the daughter is exhibiting.  Therapist praised Client for her ability to arrive at a few solutions, as initially all of her thoughts were negative and produced no results.  Therapist educated Client on the importance of balanced thoughts.  Therapist, when necessary pointed out to Client her use of negative thinking styles.  Therapist provided examples to Clients understanding as to why it is important to be mindful of this type of mental filtering as it often disqualifies the positives that are occurring.    Suicidal/Homicidal: No  Plan: Return again in 2 weeks.  Diagnosis: Axis I: Bipolar, mixed    Axis II: No diagnosis    Lubertha South, LCSW 01/16/2018

## 2018-02-27 ENCOUNTER — Ambulatory Visit: Admitting: Licensed Clinical Social Worker

## 2018-03-13 ENCOUNTER — Ambulatory Visit (INDEPENDENT_AMBULATORY_CARE_PROVIDER_SITE_OTHER): Admitting: Licensed Clinical Social Worker

## 2018-03-13 DIAGNOSIS — F3131 Bipolar disorder, current episode depressed, mild: Secondary | ICD-10-CM | POA: Diagnosis not present

## 2018-03-17 NOTE — Progress Notes (Signed)
[facility] Consultation      Assessment and Plan:  The patient is a 57 year old female with history of COPD, sleep apnea and chronic dyspnea on exertion.  COPD. -Patient has emphysematous changes seen on CT chest, already appears to be on maximal therapy with Advair and Spiriva.  At last visit she was weaned off of Bronkaid, she appears to be doing well, will decrease Advair to once daily, and consider weaning further at next visit. 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  Sleep apnea. -Home sleep study 07/03/17 showed an AHI of 20. -Patient declined CPAP, she was referred to dentist for a mandibular advancement device, to find this as well.  Dyspnea on exertion, suspect deconditioning. -Patient has significant deconditioning, likely exacerbated by recent surgery. -Patient is referred to pulmonary rehab.  Orders Placed This Encounter  Procedures  . AMB referral to pulmonary rehabilitation    Return in about 6 months (around 09/17/2018).   Date: 03/17/2018  MRN# [id number] Bailey Flores 1961/05/21    Bailey Flores is a 57 y.o. old female seen in consultation for chief complaint of:    Chief Complaint  Patient presents with  . COPD    c/o sob with exertion  . Sleep Apnea    no treatment, patient did not f/u with dentist for oral device.    Synopsis: The patient is a 57 year old female last seen with symptoms of dyspnea, findings of emphysema on imaging secondary to a history of smoking. 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    Subjective The patient is a 57 yo female with a history of moderate COPD confirmed on PFT, she also has moderate OSA.  Today she notes that she has been having progressive dyspnea on exertion. She has dyspnea with moderate activity such as like picking up things, and sometimes when she wakes in the morning. She  had a back surgery in June and she was "out of commision" until last month, when she started she found that she was not has more dyspnea. She had home PT.  She is using about once per day. She is taking advair once daily (was lowered from bid), spiriva once daily.  She has a cat at home, outside.  She does not smoke, last smoked about 5 years ago.   **PFT 09/06/2017>> tracings personally reviewed.  FVC is 82% predicted, FEV1 is 62% predicted.  Ratio 62%.  There is no significant improvement bronchodilator therapy.  TLC 79% protected, RV to TLC ratio is normal.  DLCO is 80%.  Flow volume loop suggest obstruction. -Overall this test shows moderate obstructive lung disease. **CBC 01/24/18>> Abs eos 100.  ** HST 07/03/17>>Moderate OSA with AHI of 20.  **Prevnar 13; given 05/04/17 **Desat walk 05/04/17 on RA at rest; 96% and HR 90. Walked 180 feet, conversational no dyspnea. Sat was 91% and HR 111.  **Imaging personally reviewed; low dose CT chest 05/01/17; no nodules, mild emphysema per report but otherwise lungs appear normal.   Medication:    Current Outpatient Medications:  .  ADVAIR DISKUS 250-50 MCG/DOSE AEPB, USE 1 INHALATION TWICE A DAY, Disp: 180 each, Rfl: 1 .  albuterol (PROVENTIL HFA;VENTOLIN HFA) 108 (90 Base) MCG/ACT inhaler, Inhale 1-2 puffs into the lungs every 4 (four) hours as needed for wheezing or shortness of breath. , Disp: , Rfl:  .  buPROPion (WELLBUTRIN SR) 200 MG 12 hr tablet,  Take 1 tablet (200 mg total) by mouth 2 (two) times daily., Disp: 180 tablet, Rfl: 0 .  celecoxib (CELEBREX) 100 MG capsule, Take 1 capsule (100 mg total) by mouth 2 (two) times daily., Disp: 60 capsule, Rfl: 0 .  gabapentin (NEURONTIN) 300 MG capsule, Take 300 mg 3 (three) times daily by mouth. , Disp: , Rfl:  .  ipratropium-albuterol (DUONEB) 0.5-2.5 (3) MG/3ML SOLN, Inhale 3 mLs into the lungs every 4 (four) hours as needed (Only uses with cold symptoms). , Disp: , Rfl:  .  lamoTRIgine (LAMICTAL) 100  MG tablet, Take 1 tablet (100 mg total) by mouth 2 (two) times daily., Disp: 180 tablet, Rfl: 0 .  lisdexamfetamine (VYVANSE) 70 MG capsule, Take 1 capsule (70 mg total) by mouth daily., Disp: 90 capsule, Rfl: 0 .  losartan-hydrochlorothiazide (HYZAAR) 50-12.5 MG tablet, Take 1 tablet by mouth at bedtime. , Disp: , Rfl:  .  lubiprostone (AMITIZA) 24 MCG capsule, Take 24 mcg by mouth daily with breakfast. , Disp: , Rfl:  .  MAGNESIUM PO, Take 500 mg by mouth every evening. , Disp: , Rfl:  .  methocarbamol (ROBAXIN) 750 MG tablet, Take 1 tablet (750 mg total) by mouth every 6 (six) hours., Disp: 90 tablet, Rfl: `0 .  milk thistle 175 MG tablet, Take 175 mg by mouth every evening. , Disp: , Rfl:  .  montelukast (SINGULAIR) 10 MG tablet, Take 10 mg by mouth at bedtime. , Disp: , Rfl:  .  Multiple Vitamins-Minerals (CENTRUM SILVER PO), Take 1 tablet by mouth daily. , Disp: , Rfl:  .  Multiple Vitamins-Minerals (CENTRUM SILVER PO), Take 1 tablet by mouth daily., Disp: , Rfl:  .  omeprazole (PRILOSEC) 40 MG capsule, Take 40 mg by mouth daily. , Disp: , Rfl:  .  orlistat (ALLI) 60 MG capsule, Take 60 mg by mouth 2 (two) times daily with a meal., Disp: , Rfl:  .  oxyCODONE (OXY IR/ROXICODONE) 5 MG immediate release tablet, Take 1 tablet (5 mg total) by mouth every 3 (three) hours as needed for moderate pain ((score 4 to 6))., Disp: 30 tablet, Rfl: 0 .  SPIRIVA RESPIMAT 2.5 MCG/ACT AERS, Inhale 2 puffs into the lungs every morning. , Disp: , Rfl:    Allergies:  Hydrocodone-acetaminophen  Review of Systems:  Constitutional: Feels well. Cardiovascular: Denies chest pain, exertional chest pain.  Pulmonary: Denies hemoptysis, pleuritic chest pain.   The remainder of systems were reviewed and were found to be negative other than what is documented in the HPI.    Physical Examination:   VS: BP 110/64 (BP Location: Left Arm, Cuff Size: Normal)   Pulse 85   Resp 16   Ht 4' 10.5" (1.486 m)   Wt 139 lb  (63 kg)   SpO2 96%   BMI 28.56 kg/m   General Appearance: No distress  Neuro:without focal findings, mental status, speech normal, alert and oriented HEENT: PERRLA, EOM intact Pulmonary: No wheezing, No rales  CardiovascularNormal S1,S2.  No m/r/g.  Abdomen: Benign, Soft, non-tender, No masses Renal:  No costovertebral tenderness  GU:  No performed at this time. Endoc: No evident thyromegaly, no signs of acromegaly or Cushing features Skin:   warm, no rashes, no ecchymosis  Extremities: normal, no cyanosis, clubbing.      LABORATORY PANEL:   CBC No results for input(s): WBC, HGB, HCT, PLT in the last 168 hours. ------------------------------------------------------------------------------------------------------------------  Chemistries  No results for input(s): NA, K, CL, CO2, GLUCOSE, BUN, CREATININE,  CALCIUM, MG, AST, ALT, ALKPHOS, BILITOT in the last 168 hours.  Invalid input(s): GFRCGP ------------------------------------------------------------------------------------------------------------------  Cardiac Enzymes No results for input(s): TROPONINI in the last 168 hours. ------------------------------------------------------------  RADIOLOGY:  No results found.     Thank  you for the consultation and for allowing [facility] to assist in the care of your patient. Our recommendations are noted above.  Please contact us if we can be of further service.  Marda Stalker, M.D., F.C.C.P.  Board Certified in Internal Medicine, Pulmonary Medicine, [place], and Sleep Medicine.  [facility] Office Number: [phone]   03/17/2018

## 2018-03-18 ENCOUNTER — Ambulatory Visit (INDEPENDENT_AMBULATORY_CARE_PROVIDER_SITE_OTHER): Admitting: Internal Medicine

## 2018-03-18 ENCOUNTER — Encounter: Payer: Self-pay | Admitting: Internal Medicine

## 2018-03-18 VITALS — BP 110/64 | HR 85 | Resp 16 | Ht 58.5 in | Wt 139.0 lb

## 2018-03-18 DIAGNOSIS — J449 Chronic obstructive pulmonary disease, unspecified: Secondary | ICD-10-CM

## 2018-03-18 DIAGNOSIS — Z23 Encounter for immunization: Secondary | ICD-10-CM

## 2018-03-18 NOTE — Addendum Note (Signed)
Addended by: Stephanie Coup on: 03/18/2018 12:32 PM   Modules accepted: Orders

## 2018-03-18 NOTE — Patient Instructions (Addendum)
Increase advair to twice daily.  Will refer to pulmonary rehab.

## 2018-03-19 NOTE — Progress Notes (Signed)
   THERAPIST PROGRESS NOTE  Session Time: 60 min  Participation Level: Active  Behavioral Response: CasualAlertEuthymic  Type of Therapy: Individual Therapy  Treatment Goals addressed: Anxiety  Interventions: CBT and Motivational Interviewing  Summary: Bailey Flores is a 57 y.o. female who presents with continued symtpoms of diagnosis.  Therapist met with Patient in an outpatient setting to assess current mood and assist with making progress towards goals through the use of therapeutic intervention. Therapist did a brief mood check, assessing anger, fear, disgust, excitement, happiness, and sadness.  Patient reports "unfavorable" mood due to her daughter being incarcerated.  Prompted Patient for her thought's regarding her efforts to stabilize her mental health symptoms over the past week.  Encouraged Patient to build a larger support network. Engaged Patient in the development of effective relaxation techniques to better manage overwhelming feelings and decrease adverse reactive behaviors.  Provided psychoeducation on focused breathing and progressive muscle relaxation via "Topic 8a: Relaxation Techniques" (IMR: Practitioner Guides and Handouts, p.285). Modeled the aforementioned relaxation skills for Patient before engaging her in a cognitive rehearsal of the activities to ensure independent ease of use, comfort, and effectiveness.  Assessed Patient's progress toward goal attainment    Suicidal/Homicidal: No  Plan: Return again in 2 weeks.  Diagnosis: Axis I: Bipolar, Depressed    Axis II: No diagnosis    Lubertha South, LCSW 03/13/2018

## 2018-03-27 ENCOUNTER — Ambulatory Visit (INDEPENDENT_AMBULATORY_CARE_PROVIDER_SITE_OTHER): Admitting: Licensed Clinical Social Worker

## 2018-03-27 DIAGNOSIS — F3131 Bipolar disorder, current episode depressed, mild: Secondary | ICD-10-CM | POA: Diagnosis not present

## 2018-03-29 NOTE — Progress Notes (Signed)
   THERAPIST PROGRESS NOTE  Session Time: 48mn  Participation Level: Active  Behavioral Response: CasualAlertEuthymic  Type of Therapy: Individual Therapy  Treatment Goals addressed: Coping  Interventions: Motivational Interviewing  Summary: Bailey Reichenbachis a 57y.o. female who presents with a reduction in symptoms.  Therapist met with Patient in an outpatient setting to assess current mood and assist with making progress towards goals through the use of therapeutic intervention. Therapist did a brief mood check, assessing anger, fear, disgust, excitement, happiness, and sadness.  Patient reports "happy" mood.  Patient expressed her concerns regarding her daughter and explored with Therapist possible rules to set prior to daughter moving back into the home.  Therapist educated Patient on Medicaid mental health services and suggested CST for her daughter.  Patient reports current stressor with her husband.  Patient was able to vent her feelings/frustrations with her husband.  Therapist suggested time apart since they are always together.  Therapist assisted Patient with exploring how she can "me" time without husband feeling neglected.  Discussion of coping skills that are used to assist with relaxation.    Suicidal/Homicidal: No  Plan: Return again in 2 weeks.  Diagnosis: Axis I: Bipolar, mixed    Axis II: No diagnosis    NLubertha South LCSW 03/25/2018

## 2018-04-03 ENCOUNTER — Other Ambulatory Visit: Payer: Self-pay | Admitting: Psychiatry

## 2018-04-03 DIAGNOSIS — F3131 Bipolar disorder, current episode depressed, mild: Secondary | ICD-10-CM

## 2018-04-04 ENCOUNTER — Other Ambulatory Visit: Payer: Self-pay

## 2018-04-04 ENCOUNTER — Ambulatory Visit (INDEPENDENT_AMBULATORY_CARE_PROVIDER_SITE_OTHER): Admitting: Psychiatry

## 2018-04-04 ENCOUNTER — Encounter: Payer: Self-pay | Admitting: Psychiatry

## 2018-04-04 VITALS — BP 129/81 | HR 85 | Temp 97.6°F | Wt 137.8 lb

## 2018-04-04 DIAGNOSIS — F900 Attention-deficit hyperactivity disorder, predominantly inattentive type: Secondary | ICD-10-CM | POA: Diagnosis not present

## 2018-04-04 DIAGNOSIS — F3131 Bipolar disorder, current episode depressed, mild: Secondary | ICD-10-CM

## 2018-04-04 MED ORDER — LAMOTRIGINE 100 MG PO TABS: 300.0000 mg | ORAL_TABLET | Freq: Every day | ORAL | 4 refills | Status: DC

## 2018-04-04 MED ORDER — LISDEXAMFETAMINE DIMESYLATE 70 MG PO CAPS: 70.0000 mg | ORAL_CAPSULE | Freq: Every day | ORAL | 0 refills | Status: DC

## 2018-04-04 NOTE — Progress Notes (Signed)
[facility] MD OP Progress Note  04/04/2018 5:47 PM Deziyah Arvin  MRN:  [id number]  Chief Complaint: ' I am here for follow up." Chief Complaint    Follow-up; Medication Refill     HPI: Mischele is a 57 year old female who is married, unemployed, has a history of bipolar disorder, ADHD, cognitive disorder, COPD, hypertension, chronic pain, presented to the clinic today for a follow-up visit.  Patient today reports she has been compliant with her medications.  Patient reports she tried going down on the Lamictal as discussed last visit however she started feeling irritable.  She hence went back to her normal dosage of 300 mg daily.  She reports she is currently doing well with regards to her mood symptoms.  She wants to stay on the same medication regimen.  Patient reports she is tolerating the Vyvanse well.  She denies any problems with her focus or concentration.  She denies any side effects to the medication.  Patient reports sleep is good.  She and her husband looks forward to Thanksgiving and Christmas holidays.   Visit Diagnosis:    ICD-10-CM   1. Bipolar disorder, current episode depressed, mild (HCC) F31.31 lamoTRIgine (LAMICTAL) 100 MG tablet  2. Attention deficit hyperactivity disorder (ADHD), predominantly inattentive type F90.0 lisdexamfetamine (VYVANSE) 70 MG capsule    Past Psychiatric History: Have reviewed past psychiatric history from my progress note on 08/10/2017.  Past Medical History:  Past Medical History:  Diagnosis Date  . Arthritis    hands  . Asthma   . Barrett's esophagus   . Cancer [facility])    neuroendocrine ca  . COPD (chronic obstructive pulmonary disease) ([address])   . Depression   . GERD (gastroesophageal reflux disease)   . Hypertension   . Motion sickness    reading in car  . 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 Pneumonia 2017  . Sleep apnea     Past Surgical History:  Procedure Laterality Date  . ABDOMINAL HYSTERECTOMY    . BOWEL RESECTION   07/19/2012  . CESAREAN SECTION     x3  . ESOPHAGOGASTRODUODENOSCOPY (EGD) WITH PROPOFOL N/A 03/05/2017   Procedure: ESOPHAGOGASTRODUODENOSCOPY (EGD) WITH PROPOFOL;  Surgeon: [name], MD;  Location: [facility];  Service: Endoscopy;  Laterality: N/A;  . ESOPHAGOGASTRODUODENOSCOPY (EGD) WITH PROPOFOL N/A 03/30/2017   REPEAT IN 03/2019  . HAND SURGERY Right   . TRANSFORAMINAL LUMBAR INTERBODY FUSION (TLIF) WITH PEDICLE SCREW FIXATION 1 LEVEL N/A 12/05/2017   Procedure: TRANSFORAMINAL LUMBAR INTERBODY FUSION (TLIF) WITH PEDICLE SCREW FIXATION 1 LEVEL-L4-5;  Surgeon: Meade Maw, MD;  Location: [facility];  Service: Neurosurgery;  Laterality: N/A;    Family Psychiatric History: Have reviewed family psychiatric history from my progress note on 08/10/2017.  Family History:  Family History  Problem Relation Age of Onset  . Breast cancer Cousin        mat cousin    Social History: Have reviewed social history from my progress note on 08/10/2017. Social History   Socioeconomic History  . Marital status: Married    Spouse name: Not on file  . Number of children: Not on file  . Years of education: Not on file  . Highest education level: Not on file  Occupational History  . Not on file  Social Needs  . Financial resource strain: Not on file  . Food insecurity:    Worry: Not on file    Inability: Not on file  . Transportation needs:    Medical: Not on file  Non-medical: Not on file  Tobacco Use  . Smoking status: Former Smoker    Packs/day: 2.00    Years: 37.00    Pack years: 74.00    Types: Cigarettes    Last attempt to quit: 07/19/2012    Years since quitting: 5.7  . Smokeless tobacco: Never Used  Substance and Sexual Activity  . Alcohol use: Yes    Comment: 2 drinks/mo  . Drug use: No  . Sexual activity: Yes  Lifestyle  . Physical activity:    Days per week: Not on file    Minutes per session: Not on file  . Stress: Not on file  Relationships  . Social  connections:    Talks on phone: Not on file    Gets together: Not on file    Attends religious service: Not on file    Active member of club or organization: Not on file    Attends meetings of clubs or organizations: Not on file    Relationship status: Not on file  Other Topics Concern  . Not on file  Social History Narrative  . Not on file    Allergies:  Allergies  Allergen Reactions  . Hydrocodone-Acetaminophen Itching    Metabolic Disorder Labs: Lab Results  Component Value Date   HGBA1C 5.9 (H) 06/13/2017   MPG 122.63 06/13/2017   Lab Results  Component Value Date   PROLACTIN 4.6 (L) 06/13/2017   Lab Results  Component Value Date   CHOL 183 06/13/2017   TRIG 80 06/13/2017   HDL 92 06/13/2017   CHOLHDL 2.0 06/13/2017   VLDL 16 06/13/2017   LDLCALC 75 06/13/2017   Lab Results  Component Value Date   TSH 1.205 06/13/2017    Therapeutic Level Labs: No results found for: LITHIUM No results found for: VALPROATE No components found for:  CBMZ  Current Medications: Current Outpatient Medications  Medication Sig Dispense Refill  . ADVAIR DISKUS 250-50 MCG/DOSE AEPB USE 1 INHALATION TWICE A DAY (Patient taking differently: Inhale 1 puff into the lungs 2 times daily at 12 noon and 4 pm. ) 180 each 1  . albuterol (PROVENTIL HFA;VENTOLIN HFA) 108 (90 Base) MCG/ACT inhaler Inhale 1-2 puffs into the lungs every 4 (four) hours as needed for wheezing or shortness of breath.     Marland Kitchen buPROPion (WELLBUTRIN SR) 200 MG 12 hr tablet TAKE 1 TABLET TWICE A DAY 180 tablet 4  . celecoxib (CELEBREX) 100 MG capsule Take 1 capsule (100 mg total) by mouth 2 (two) times daily. 60 capsule 0  . gabapentin (NEURONTIN) 300 MG capsule Take 300 mg 3 (three) times daily by mouth.     . lamoTRIgine (LAMICTAL) 100 MG tablet Take 3 tablets (300 mg total) by mouth daily. 270 tablet 4  . lisdexamfetamine (VYVANSE) 70 MG capsule Take 1 capsule (70 mg total) by mouth daily. 90 capsule 0  .  losartan-hydrochlorothiazide (HYZAAR) 50-12.5 MG tablet Take 1 tablet by mouth at bedtime.     Marland Kitchen lubiprostone (AMITIZA) 24 MCG capsule Take 24 mcg by mouth daily with breakfast.     . MAGNESIUM PO Take 500 mg by mouth every evening.     . methocarbamol (ROBAXIN) 750 MG tablet Take 1 tablet (750 mg total) by mouth every 6 (six) hours. (Patient taking differently: Take 750 mg by mouth 2 (two) times daily. ) 90 tablet `0  . milk thistle 175 MG tablet Take 175 mg by mouth every evening.     . montelukast (SINGULAIR) 10  MG tablet Take 10 mg by mouth at bedtime.     . Multiple Vitamins-Minerals (CENTRUM SILVER PO) Take 1 tablet by mouth daily.     . Multiple Vitamins-Minerals (CENTRUM SILVER PO) Take 1 tablet by mouth daily.    Marland Kitchen omeprazole (PRILOSEC) 40 MG capsule Take 40 mg by mouth daily.     Marland Kitchen orlistat (ALLI) 60 MG capsule Take 60 mg by mouth 2 (two) times daily with a meal.    . oxyCODONE (OXY IR/ROXICODONE) 5 MG immediate release tablet Take 1 tablet (5 mg total) by mouth every 3 (three) hours as needed for moderate pain ((score 4 to 6)). 30 tablet 0  . SPIRIVA RESPIMAT 2.5 MCG/ACT AERS Inhale 2 puffs into the lungs every morning.     Marland Kitchen ipratropium-albuterol (DUONEB) 0.5-2.5 (3) MG/3ML SOLN Inhale 3 mLs into the lungs every 4 (four) hours as needed (Only uses with cold symptoms).      No current facility-administered medications for this visit.      Musculoskeletal: Strength & Muscle Tone: within normal limits Gait & Station: normal Patient leans: N/A  Psychiatric Specialty Exam: Review of Systems  Psychiatric/Behavioral: Negative for depression. The patient is not nervous/anxious.   All other systems reviewed and are negative.   Blood pressure 129/81, pulse 85, temperature 97.6 F (36.4 C), temperature source Oral, weight 137 lb 12.8 oz (62.5 kg).Body mass index is 28.31 kg/m.  General Appearance: Casual  Eye Contact:  Fair  Speech:  Clear and Coherent  Volume:  Normal  Mood:   Euthymic  Affect:  Congruent  Thought Process:  Goal Directed and Descriptions of Associations: Intact  Orientation:  Full (Time, Place, and Person)  Thought Content: Logical   Suicidal Thoughts:  No  Homicidal Thoughts:  No  Memory:  Immediate;   Fair Recent;   Fair Remote;   Fair  Judgement:  Fair  Insight:  Fair  Psychomotor Activity:  Normal  Concentration:  Concentration: Fair and Attention Span: Fair  Recall:  [employer] of Knowledge: Fair  Language: Fair  Akathisia:  No  Handed:  Right  AIMS (if indicated): denies tremors,rigidity,stiffness  Assets:  Communication Skills Desire for Improvement Resilience Social Support  ADL's:  Intact  Cognition: WNL  Sleep:  Fair   Screenings: PHQ2-9     Pulmonary Rehab from 06/25/2017 in [facility]  PHQ-2 Total Score  4  PHQ-9 Total Score  19       Assessment and Plan: Cieanna is a 57 year old Hispanic female who has a history of bipolar disorder, ADHD, cognitive disorder ,, COPD, GERD, presented to the clinic today for a follow-up visit.  Patient reports she is currently doing well on the current medication regimen.  She denies any significant mood symptoms or attention problems.  We will continue medications as noted below.  Plan Bipolar disorder Continue Lamictal 300 mg p.o. daily in divided dosage. Continue Wellbutrin SR 200 mg p.o. twice daily.  For anxiety symptoms Hydroxyzine 25 mg p.o. as needed.  For ADHD Vyvanse 70 mg p.o. Daily. Reviewed [place] controlled substance database.  Insomnia Continue melatonin.   Follow-up in clinic in 3 months or sooner if needed.  More than 50 % of the time was spent for psychoeducation and supportive psychotherapy and care coordination.  This note was generated in part or whole with voice recognition software. Voice recognition is usually quite accurate but there are transcription errors that can and very often do occur. I apologize for any  typographical  errors that were not detected and corrected.          [name], MD 04/04/2018, 5:47 PM

## 2018-04-10 ENCOUNTER — Ambulatory Visit (INDEPENDENT_AMBULATORY_CARE_PROVIDER_SITE_OTHER): Admitting: Licensed Clinical Social Worker

## 2018-04-10 DIAGNOSIS — F3131 Bipolar disorder, current episode depressed, mild: Secondary | ICD-10-CM | POA: Diagnosis not present

## 2018-04-16 ENCOUNTER — Other Ambulatory Visit: Payer: Self-pay | Admitting: Psychiatry

## 2018-04-24 ENCOUNTER — Telehealth: Payer: Self-pay | Admitting: *Deleted

## 2018-04-24 DIAGNOSIS — Z87891 Personal history of nicotine dependence: Secondary | ICD-10-CM

## 2018-04-24 DIAGNOSIS — Z122 Encounter for screening for malignant neoplasm of respiratory organs: Secondary | ICD-10-CM

## 2018-04-24 NOTE — Telephone Encounter (Signed)
Patient has been notified that annual lung cancer screening low dose CT scan is due currently or will be in near future. Confirmed that patient is within the age range of 55-77, and asymptomatic, (no signs or symptoms of lung cancer). Patient denies illness that would prevent curative treatment for lung cancer if found. Verified smoking history, (former, quit 07/19/12, 74 pack year). The shared decision making visit was done 05/01/17. Patient is agreeable for CT scan being scheduled.

## 2018-04-25 ENCOUNTER — Ambulatory Visit: Admitting: Licensed Clinical Social Worker

## 2018-05-02 ENCOUNTER — Ambulatory Visit
Admission: RE | Admit: 2018-05-02 | Discharge: 2018-05-02 | Disposition: A | Discharge status: Home / Self Care | Source: Ambulatory Visit | Attending: Oncology | Admitting: Oncology

## 2018-05-02 DIAGNOSIS — Z122 Encounter for screening for malignant neoplasm of respiratory organs: Secondary | ICD-10-CM | POA: Diagnosis not present

## 2018-05-02 DIAGNOSIS — Z87891 Personal history of nicotine dependence: Secondary | ICD-10-CM | POA: Diagnosis present

## 2018-05-06 ENCOUNTER — Telehealth: Payer: Self-pay | Admitting: *Deleted

## 2018-05-06 ENCOUNTER — Encounter: Payer: Self-pay | Admitting: *Deleted

## 2018-05-06 NOTE — Telephone Encounter (Signed)
Notified patient of LDCT lung cancer screening program results with recommendation for 12 month follow up imaging. Also notified of incidental findings noted below and is encouraged to discuss further with PCP who will receive a copy of this note and/or the CT report. Patient verbalizes understanding, reports foul sputum production and is aware that she should contact her PCP's office to discuss further and consider treatment with antibiotics or other medications.   IMPRESSION: 1. Lung-RADS 1, negative. Continue annual screening with low-dose chest CT without contrast in 12 months. 2. New or increased bronchial wall thickening and mucoid impaction at the lung bases. Suboptimally evaluated secondary to mild motion in this area. Findings are likely due to interval infection or aspiration. Correlate with infectious symptoms. 3. Age advanced coronary artery atherosclerosis. Recommend assessment of coronary risk factors and consideration of medical therapy. 4.  Emphysema (ICD10-J43.9).

## 2018-05-14 ENCOUNTER — Ambulatory Visit: Admitting: Licensed Clinical Social Worker

## 2018-05-20 ENCOUNTER — Ambulatory Visit (INDEPENDENT_AMBULATORY_CARE_PROVIDER_SITE_OTHER): Admitting: Licensed Clinical Social Worker

## 2018-05-20 DIAGNOSIS — F3131 Bipolar disorder, current episode depressed, mild: Secondary | ICD-10-CM | POA: Diagnosis not present

## 2018-05-21 ENCOUNTER — Telehealth: Payer: Self-pay | Admitting: *Deleted

## 2018-05-21 NOTE — Telephone Encounter (Signed)
Called patient to check on her after her LDCT.  Asked if she is having any respiratory problems.  Patient states she is not having anything that is different from her usual COPD symptoms.  She did see her PCP yesterday and states she has a mild case of pneumonia.  She was given antibiotics and is otherwise doing well.  She was appreciative of the call to check on her.

## 2018-05-29 NOTE — Progress Notes (Signed)
   THERAPIST PROGRESS NOTE  Session Time: 72min  Participation Level: Active  Behavioral Response: CasualAlertEuthymic  Type of Therapy: Family Therapy  Treatment Goals addressed: Coping and Diagnosis: Bipolar  Interventions: CBT and Motivational Interviewing  Summary: Bailey Flores is a 58 y.o. female who presents with continued symptoms of her diagnosis.   Therapist allowed each person to introduce themselves and give one fun fact about themselves. Therapist assisted the family with exploring the term values. Members was given the opportunity to define the term in their own terms.  Therapist expressed that valued are an important part of each household. Therapist encouraged the family to define the word with each person assisting. Therapist listed specific words such as "family fun time, vacation, family dinner, work, school, leisure, friends, holidays& chores and had the family to rate if they valued the term.  Therapist introduced a worksheet from the website [employer] to assist the family with understanding values.  Therapist gave options for increasing the family value of quality time. Therapist encouraged family to interact with one another once a week for at least an hour to increase family value of quality time.  Suicidal/Homicidal: No  Plan: Return again in 2 weeks.  Diagnosis: Axis I: Bipolar, Depressed    Axis II: No diagnosis    Lubertha South, LCSW 04/10/2018

## 2018-06-05 NOTE — Progress Notes (Signed)
[facility] Consultation      Assessment and Plan:  The patient is a 58 year old female with history of COPD, sleep apnea and chronic dyspnea on exertion.  COPD, recent exacerbation.. -Patient has emphysematous changes seen on CT chest, already appears to be on maximal therapy with Advair and Spiriva.  At last visit she was weaned off of Bronkaid, she appears to be doing well, will decrease Advair to once daily. -She appears to have recovered from her recent exacerbation, she received antibiotics, no further treatment needed. -Prevnar 13; 05/03/2017 -CT lung cancer screening, and enrolled.  Sleep apnea. -Home sleep study 07/03/17 showed an AHI of 20. -Patient declined CPAP, she was referred to dentist for a mandibular advancement device, but she did not hear back.  She is requesting a different dentist, will refer to a dentist here in [place].  Dyspnea on exertion, suspect deconditioning. -Patient has significant deconditioning, likely exacerbated by recent surgery. -Patient is referred to pulmonary rehab.    Return in about 6 months (around 12/05/2018).   Date: 06/05/2018  MRN# [id number] Bailey Flores 1960/08/02    Bailey Flores is a 58 y.o. old female seen in consultation for chief complaint of:    Chief Complaint  Patient presents with  . Follow-up    pneumonia in Dec but residual cough.  . Shortness of Breath    she is using rescue inhaler and nebs more often.    Synopsis: The patient is a 59 year old female last seen with symptoms of dyspnea, findings of emphysema on imaging secondary to a history of smoking. 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    Subjective The patient is a 58 yo female with a history of moderate COPD confirmed on PFT, she also has moderate OSA.  At last visit she was maintained on Spiriva, asked to decrease Advair.  She had been previously habituated to [facility] which had been weaned  off.  She had a recent bout of pneumonia for which she saw her PCP and was treated with abx. Currently she feels that she is doing better, she has residual mucus production of green sputum.  She was referred to a dentist but did not hear back, she did not want cpap.  She continues on advair and spiriva, she has not been taking bronkaid.   She has a cat at home, outside.  She does not smoke, last smoked about 5 years ago.   **PFT 09/06/2017>> tracings personally reviewed.  FVC is 82% predicted, FEV1 is 62% predicted.  Ratio 62%.  There is no significant improvement bronchodilator therapy.  TLC 79% protected, RV to TLC ratio is normal.  DLCO is 80%.  Flow volume loop suggest obstruction. -Overall this test shows moderate obstructive lung disease. **CBC 01/24/18>> Abs eos 100.  ** HST 07/03/17>>Moderate OSA with AHI of 20.  **Prevnar 13; given 05/04/17 **Desat walk 05/04/17 on RA at rest; 96% and HR 90. Walked 180 feet, conversational no dyspnea. Sat was 91% and HR 111.  **Imaging personally reviewed; low dose CT chest 05/01/17; no nodules, mild emphysema per report but otherwise lungs appear normal.   Medication:    Current Outpatient Medications:  .  ADVAIR DISKUS 250-50 MCG/DOSE AEPB, USE 1 INHALATION TWICE A DAY (Patient taking differently: Inhale 1 puff into the lungs 2 times daily at 12 noon and 4 pm. ), Disp: 180 each, Rfl: 1 .  albuterol (PROVENTIL HFA;VENTOLIN HFA) 108 (90 Base) MCG/ACT inhaler, Inhale 1-2 puffs into  the lungs every 4 (four) hours as needed for wheezing or shortness of breath. , Disp: , Rfl:  .  buPROPion (WELLBUTRIN SR) 200 MG 12 hr tablet, TAKE 1 TABLET TWICE A DAY, Disp: 180 tablet, Rfl: 4 .  celecoxib (CELEBREX) 100 MG capsule, Take 1 capsule (100 mg total) by mouth 2 (two) times daily., Disp: 60 capsule, Rfl: 0 .  gabapentin (NEURONTIN) 300 MG capsule, Take 300 mg 3 (three) times daily by mouth. , Disp: , Rfl:  .  ipratropium-albuterol (DUONEB) 0.5-2.5 (3) MG/3ML  SOLN, Inhale 3 mLs into the lungs every 4 (four) hours as needed (Only uses with cold symptoms). , Disp: , Rfl:  .  lamoTRIgine (LAMICTAL) 100 MG tablet, Take 3 tablets (300 mg total) by mouth daily., Disp: 270 tablet, Rfl: 4 .  lisdexamfetamine (VYVANSE) 70 MG capsule, Take 1 capsule (70 mg total) by mouth daily., Disp: 90 capsule, Rfl: 0 .  losartan-hydrochlorothiazide (HYZAAR) 50-12.5 MG tablet, Take 1 tablet by mouth at bedtime. , Disp: , Rfl:  .  lubiprostone (AMITIZA) 24 MCG capsule, Take 24 mcg by mouth daily with breakfast. , Disp: , Rfl:  .  MAGNESIUM PO, Take 500 mg by mouth every evening. , Disp: , Rfl:  .  methocarbamol (ROBAXIN) 750 MG tablet, Take 1 tablet (750 mg total) by mouth every 6 (six) hours. (Patient taking differently: Take 750 mg by mouth 2 (two) times daily. ), Disp: 90 tablet, Rfl: `0 .  milk thistle 175 MG tablet, Take 175 mg by mouth every evening. , Disp: , Rfl:  .  montelukast (SINGULAIR) 10 MG tablet, Take 10 mg by mouth at bedtime. , Disp: , Rfl:  .  Multiple Vitamins-Minerals (CENTRUM SILVER PO), Take 1 tablet by mouth daily. , Disp: , Rfl:  .  Multiple Vitamins-Minerals (CENTRUM SILVER PO), Take 1 tablet by mouth daily., Disp: , Rfl:  .  omeprazole (PRILOSEC) 40 MG capsule, Take 40 mg by mouth daily. , Disp: , Rfl:  .  orlistat (ALLI) 60 MG capsule, Take 60 mg by mouth 2 (two) times daily with a meal., Disp: , Rfl:  .  oxyCODONE (OXY IR/ROXICODONE) 5 MG immediate release tablet, Take 1 tablet (5 mg total) by mouth every 3 (three) hours as needed for moderate pain ((score 4 to 6))., Disp: 30 tablet, Rfl: 0 .  SPIRIVA RESPIMAT 2.5 MCG/ACT AERS, Inhale 2 puffs into the lungs every morning. , Disp: , Rfl:    Allergies:  Hydrocodone-acetaminophen   Review of Systems:  Constitutional: Feels well. Cardiovascular: Denies chest pain, exertional chest pain.  Pulmonary: Denies hemoptysis, pleuritic chest pain.   The remainder of systems were reviewed and were found to  be negative other than what is documented in the HPI.    Physical Examination:   VS: BP (!) 142/82 (BP Location: Left Arm, Cuff Size: Normal)   Pulse 91   Resp 16   Ht 4' 10.5" (1.486 m)   Wt 136 lb (61.7 kg)   SpO2 97%   BMI 27.94 kg/m   General Appearance: No distress  Neuro:without focal findings, mental status, speech normal, alert and oriented HEENT: PERRLA, EOM intact Pulmonary: No wheezing, No rales  CardiovascularNormal S1,S2.  No m/r/g.  Abdomen: Benign, Soft, non-tender, No masses Renal:  No costovertebral tenderness  GU:  No performed at this time. Endoc: No evident thyromegaly, no signs of acromegaly or Cushing features Skin:   warm, no rashes, no ecchymosis  Extremities: normal, no cyanosis, clubbing.  LABORATORY PANEL:   CBC No results for input(s): WBC, HGB, HCT, PLT in the last 168 hours. ------------------------------------------------------------------------------------------------------------------  Chemistries  No results for input(s): NA, K, CL, CO2, GLUCOSE, BUN, CREATININE, CALCIUM, MG, AST, ALT, ALKPHOS, BILITOT in the last 168 hours.  Invalid input(s): GFRCGP ------------------------------------------------------------------------------------------------------------------  Cardiac Enzymes No results for input(s): TROPONINI in the last 168 hours. ------------------------------------------------------------  RADIOLOGY:  No results found.     Thank  you for the consultation and for allowing [facility] to assist in the care of your patient. Our recommendations are noted above.  Please contact us if we can be of further service.  Marda Stalker, M.D., F.C.C.P.  Board Certified in Internal Medicine, Pulmonary Medicine, [place], and Sleep Medicine.  [facility] Office Number: [phone]   06/05/2018

## 2018-06-06 ENCOUNTER — Encounter: Payer: Self-pay | Admitting: Internal Medicine

## 2018-06-06 ENCOUNTER — Ambulatory Visit (INDEPENDENT_AMBULATORY_CARE_PROVIDER_SITE_OTHER): Admitting: Internal Medicine

## 2018-06-06 VITALS — BP 142/82 | HR 91 | Resp 16 | Ht 58.5 in | Wt 136.0 lb

## 2018-06-06 DIAGNOSIS — G4733 Obstructive sleep apnea (adult) (pediatric): Secondary | ICD-10-CM | POA: Diagnosis not present

## 2018-06-06 DIAGNOSIS — J449 Chronic obstructive pulmonary disease, unspecified: Secondary | ICD-10-CM | POA: Diagnosis not present

## 2018-06-06 NOTE — Addendum Note (Signed)
Addended by: Stephanie Coup on: 06/06/2018 10:38 AM   Modules accepted: Orders

## 2018-06-06 NOTE — Patient Instructions (Addendum)
Continue using your current inhalers.  Will refer you back to dentist here in [place] to see about treating your sleep apnea.

## 2018-06-12 ENCOUNTER — Ambulatory Visit: Admitting: Internal Medicine

## 2018-06-20 ENCOUNTER — Ambulatory Visit (INDEPENDENT_AMBULATORY_CARE_PROVIDER_SITE_OTHER): Admitting: Licensed Clinical Social Worker

## 2018-06-20 DIAGNOSIS — F3131 Bipolar disorder, current episode depressed, mild: Secondary | ICD-10-CM | POA: Diagnosis not present

## 2018-07-02 ENCOUNTER — Other Ambulatory Visit: Payer: Self-pay | Admitting: Internal Medicine

## 2018-07-05 ENCOUNTER — Ambulatory Visit (INDEPENDENT_AMBULATORY_CARE_PROVIDER_SITE_OTHER): Admitting: Psychiatry

## 2018-07-05 ENCOUNTER — Encounter: Payer: Self-pay | Admitting: Psychiatry

## 2018-07-05 ENCOUNTER — Other Ambulatory Visit: Payer: Self-pay

## 2018-07-05 VITALS — BP 142/90 | HR 94 | Temp 98.9°F | Wt 137.4 lb

## 2018-07-05 DIAGNOSIS — F3131 Bipolar disorder, current episode depressed, mild: Secondary | ICD-10-CM | POA: Diagnosis not present

## 2018-07-05 DIAGNOSIS — F900 Attention-deficit hyperactivity disorder, predominantly inattentive type: Secondary | ICD-10-CM | POA: Diagnosis not present

## 2018-07-05 MED ORDER — LISDEXAMFETAMINE DIMESYLATE 70 MG PO CAPS: 70.0000 mg | ORAL_CAPSULE | Freq: Every day | ORAL | 0 refills | Status: DC

## 2018-07-05 NOTE — Progress Notes (Signed)
[facility] MD OP Progress Note  07/05/2018 1:09 PM Bailey Flores  MRN:  [id number]  Chief Complaint: ' I am here for follow up." Chief Complaint    Follow-up; Medication Refill     HPI: Bailey Flores is a 58 year old female who is married, unemployed, has a history of bipolar disorder, ADHD, cognitive disorder, COPD, hypertension, chronic pain, presented to clinic today for a follow-up visit.  Patient today reports she has been making progress with regards to her mood symptoms.  She is tolerating her medications well.  She denies any side effects.  She reports she is compliant on her Vyvanse which is helpful with her attention and focus.  Patient reports sleep is good.  Patient reports her daughter who struggles with mental health problems as well as substance abuse is back at home with her now.  She reports she is trying to be supportive to her daughter and she is making some progress which helps.  Patient denies any other concerns today.  Patient reports her husband continues to be supportive and he is here today with her for the evaluation.  Visit Diagnosis:    ICD-10-CM   1. Bipolar disorder, current episode depressed, mild ([place]) F31.31    in partial remission  2. Attention deficit hyperactivity disorder (ADHD), predominantly inattentive type F90.0 lisdexamfetamine (VYVANSE) 70 MG capsule    Past Psychiatric History: I have reviewed past psychiatric history from my progress note on 08/10/2017.  Past Medical History:  Past Medical History:  Diagnosis Date  . Arthritis    hands  . Asthma   . Barrett's esophagus   . Cancer [facility])    neuroendocrine ca  . COPD (chronic obstructive pulmonary disease) ([place])   . Depression   . GERD (gastroesophageal reflux disease)   . Hypertension   . Motion sickness    reading in car  . 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 Pneumonia 2017  . Sleep apnea     Past Surgical History:  Procedure Laterality Date  . ABDOMINAL HYSTERECTOMY    .  BOWEL RESECTION  07/19/2012  . CESAREAN SECTION     x3  . ESOPHAGOGASTRODUODENOSCOPY (EGD) WITH PROPOFOL N/A 03/05/2017   Procedure: ESOPHAGOGASTRODUODENOSCOPY (EGD) WITH PROPOFOL;  Surgeon: [name], MD;  Location: [facility];  Service: Endoscopy;  Laterality: N/A;  . ESOPHAGOGASTRODUODENOSCOPY (EGD) WITH PROPOFOL N/A 03/30/2017   REPEAT IN 03/2019  . HAND SURGERY Right   . TRANSFORAMINAL LUMBAR INTERBODY FUSION (TLIF) WITH PEDICLE SCREW FIXATION 1 LEVEL N/A 12/05/2017   Procedure: TRANSFORAMINAL LUMBAR INTERBODY FUSION (TLIF) WITH PEDICLE SCREW FIXATION 1 LEVEL-L4-5;  Surgeon: Meade Maw, MD;  Location: [facility];  Service: Neurosurgery;  Laterality: N/A;    Family Psychiatric History: Reviewed family psychiatric history from my progress note on 08/10/2017  Family History:  Family History  Problem Relation Age of Onset  . Breast cancer Cousin        mat cousin    Social History: Reviewed social history from my progress note on 08/10/2017 Social History   Socioeconomic History  . Marital status: Married    Spouse name: Not on file  . Number of children: Not on file  . Years of education: Not on file  . Highest education level: Not on file  Occupational History  . Not on file  Social Needs  . Financial resource strain: Not on file  . Food insecurity:    Worry: Not on file    Inability: Not on file  . Transportation needs:  Medical: Not on file    Non-medical: Not on file  Tobacco Use  . Smoking status: Former Smoker    Packs/day: 2.00    Years: 37.00    Pack years: 74.00    Types: Cigarettes    Last attempt to quit: 07/19/2012    Years since quitting: 5.9  . Smokeless tobacco: Never Used  Substance and Sexual Activity  . Alcohol use: Yes    Comment: 2 drinks/mo  . Drug use: No  . Sexual activity: Yes  Lifestyle  . Physical activity:    Days per week: Not on file    Minutes per session: Not on file  . Stress: Not on file  Relationships  .  Social connections:    Talks on phone: Not on file    Gets together: Not on file    Attends religious service: Not on file    Active member of club or organization: Not on file    Attends meetings of clubs or organizations: Not on file    Relationship status: Not on file  Other Topics Concern  . Not on file  Social History Narrative  . Not on file    Allergies:  Allergies  Allergen Reactions  . Hydrocodone-Acetaminophen Itching    Metabolic Disorder Labs: Lab Results  Component Value Date   HGBA1C 5.9 (H) 06/13/2017   MPG 122.63 06/13/2017   Lab Results  Component Value Date   PROLACTIN 4.6 (L) 06/13/2017   Lab Results  Component Value Date   CHOL 183 06/13/2017   TRIG 80 06/13/2017   HDL 92 06/13/2017   CHOLHDL 2.0 06/13/2017   VLDL 16 06/13/2017   LDLCALC 75 06/13/2017   Lab Results  Component Value Date   TSH 1.205 06/13/2017    Therapeutic Level Labs: No results found for: LITHIUM No results found for: VALPROATE No components found for:  CBMZ  Current Medications: Current Outpatient Medications  Medication Sig Dispense Refill  . ADVAIR DISKUS 250-50 MCG/DOSE AEPB USE 1 INHALATION TWICE A DAY 180 each 4  . albuterol (PROVENTIL HFA;VENTOLIN HFA) 108 (90 Base) MCG/ACT inhaler Inhale 1-2 puffs into the lungs every 4 (four) hours as needed for wheezing or shortness of breath.     Marland Kitchen buPROPion (WELLBUTRIN SR) 200 MG 12 hr tablet TAKE 1 TABLET TWICE A DAY 180 tablet 4  . gabapentin (NEURONTIN) 300 MG capsule Take 300 mg 3 (three) times daily by mouth.     . lamoTRIgine (LAMICTAL) 100 MG tablet Take 3 tablets (300 mg total) by mouth daily. 270 tablet 4  . lisdexamfetamine (VYVANSE) 70 MG capsule Take 1 capsule (70 mg total) by mouth daily. 90 capsule 0  . losartan-hydrochlorothiazide (HYZAAR) 50-12.5 MG tablet Take 1 tablet by mouth at bedtime.     Marland Kitchen lubiprostone (AMITIZA) 24 MCG capsule Take 24 mcg by mouth daily with breakfast.     . MAGNESIUM PO Take 500 mg by  mouth every evening.     . milk thistle 175 MG tablet Take 175 mg by mouth every evening.     . montelukast (SINGULAIR) 10 MG tablet Take 10 mg by mouth at bedtime.     . Multiple Vitamins-Minerals (CENTRUM SILVER PO) Take 1 tablet by mouth daily.     . Multiple Vitamins-Minerals (CENTRUM SILVER PO) Take 1 tablet by mouth daily.    Marland Kitchen omeprazole (PRILOSEC) 40 MG capsule Take 40 mg by mouth daily.     Marland Kitchen orlistat (ALLI) 60 MG capsule Take 60 mg  by mouth 2 (two) times daily with a meal.    . oxyCODONE (OXY IR/ROXICODONE) 5 MG immediate release tablet Take 1 tablet (5 mg total) by mouth every 3 (three) hours as needed for moderate pain ((score 4 to 6)). 30 tablet 0  . SPIRIVA RESPIMAT 2.5 MCG/ACT AERS Inhale 2 puffs into the lungs every morning.     Marland Kitchen ipratropium-albuterol (DUONEB) 0.5-2.5 (3) MG/3ML SOLN Inhale 3 mLs into the lungs every 4 (four) hours as needed (Only uses with cold symptoms).      No current facility-administered medications for this visit.      Musculoskeletal: Strength & Muscle Tone: within normal limits Gait & Station: normal Patient leans: N/A  Psychiatric Specialty Exam: Review of Systems  Psychiatric/Behavioral: Negative for depression and suicidal ideas. The patient does not have insomnia.   All other systems reviewed and are negative.   Blood pressure (!) 142/90, pulse 94, temperature 98.9 F (37.2 C), temperature source Oral, weight 137 lb 6.4 oz (62.3 kg).Body mass index is 28.23 kg/m.  General Appearance: Casual  Eye Contact:  Fair  Speech:  Normal Rate  Volume:  Normal  Mood:  Euthymic  Affect:  Congruent  Thought Process:  Goal Directed and Descriptions of Associations: Intact  Orientation:  Full (Time, Place, and Person)  Thought Content: Logical   Suicidal Thoughts:  No  Homicidal Thoughts:  No  Memory:  Immediate;   Fair Recent;   Fair Remote;   Fair  Judgement:  Fair  Insight:  Fair  Psychomotor Activity:  Normal  Concentration:   Concentration: Fair and Attention Span: Fair  Recall:  [employer] of Knowledge: Fair  Language: Fair  Akathisia:  No  Handed:  Right  AIMS (if indicated): denies tremors, rigidity,stiffness  Assets:  Communication Skills Desire for Improvement Social Support  ADL's:  Intact  Cognition: WNL  Sleep:  Fair   Screenings: PHQ2-9     Pulmonary Rehab from 06/25/2017 in [facility]  PHQ-2 Total Score  4  PHQ-9 Total Score  19       Assessment and Plan: Jenisis is a 58 year old Hispanic female who has a history of bipolar disorder, ADHD, cognitive disorder, COPD, GERD, presented to clinic today for a follow-up visit.  Patient reports she is currently making progress on the current medication regimen.  Plan as noted below.  Plan Bipolar disorder in partial remission Continue Lamictal 300 mg p.o. daily in divided dosage. Wellbutrin SR 200 mg p.o. twice daily  For anxiety symptoms-improving Hydroxyzine 25 mg p.o. daily as needed  For ADHD-stable Vyvanse 70 mg p.o. daily Reviewed [place] controlled substance database.  For insomnia-stable Continue melatonin.  Follow-up in clinic in 4 to 5 months or sooner if needed.  I have spent atleast 15 minutes  face to face with patient today. More than 50 % of the time was spent for psychoeducation and supportive psychotherapy and care coordination.  This note was generated in part or whole with voice recognition software. Voice recognition is usually quite accurate but there are transcription errors that can and very often do occur. I apologize for any typographical errors that were not detected and corrected.        [name], MD 07/05/2018, 1:09 PM

## 2018-07-11 ENCOUNTER — Ambulatory Visit: Admitting: Licensed Clinical Social Worker

## 2018-07-25 ENCOUNTER — Inpatient Hospital Stay

## 2018-07-25 ENCOUNTER — Inpatient Hospital Stay: Admitting: Internal Medicine

## 2018-07-25 NOTE — Assessment & Plan Note (Signed)
#   Neuroendocrine t

## 2018-07-25 NOTE — Progress Notes (Unsigned)
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  female    Review of Systems  Constitutional: Negative for chills, diaphoresis, fever, malaise/fatigue and weight loss.  HENT: Negative for nosebleeds and sore throat.   Eyes: Negative for double vision.  Respiratory: Negative for cough, hemoptysis, sputum production, shortness of breath and wheezing.   Cardiovascular: Negative for chest pain, palpitations, orthopnea and leg swelling.  Gastrointestinal: Negative for abdominal pain, blood in stool, constipation, diarrhea, heartburn, melena, nausea and vomiting.  Genitourinary: Negative for dysuria, frequency and urgency.  Musculoskeletal: Negative for back pain and joint pain.  Skin: Negative.  Negative for itching and rash.  Neurological: Negative for dizziness, tingling, focal weakness, weakness and headaches.  Endo/Heme/Allergies: Does not bruise/bleed easily.  Psychiatric/Behavioral: Negative for depression. The patient is not nervous/anxious and does not have insomnia.      MEDICAL HISTORY:  Past Medical History:  Diagnosis Date  . Arthritis    hands  . Asthma   . Barrett's esophagus   . Cancer [facility])    neuroendocrine ca  . COPD (chronic obstructive pulmonary disease) ([place])   . Depression   . GERD (gastroesophageal reflux disease)   . Hypertension   . Motion sickness    reading in car  . 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 Pneumonia 2017  . Sleep apnea     SURGICAL HISTORY: Past Surgical History:  Procedure Laterality Date  . ABDOMINAL HYSTERECTOMY    . BOWEL RESECTION  07/19/2012  . CESAREAN SECTION     x3  . ESOPHAGOGASTRODUODENOSCOPY (EGD) WITH PROPOFOL N/A  03/05/2017   Procedure: ESOPHAGOGASTRODUODENOSCOPY (EGD) WITH PROPOFOL;  Surgeon: [name], MD;  Location: [facility];  Service: Endoscopy;  Laterality: N/A;  . ESOPHAGOGASTRODUODENOSCOPY (EGD) WITH PROPOFOL N/A 03/30/2017   REPEAT IN 03/2019  . HAND SURGERY Right   . TRANSFORAMINAL LUMBAR INTERBODY FUSION (TLIF) WITH PEDICLE SCREW FIXATION 1 LEVEL N/A 12/05/2017   Procedure: TRANSFORAMINAL LUMBAR INTERBODY FUSION (TLIF) WITH PEDICLE SCREW FIXATION 1 LEVEL-L4-5;  Surgeon: Meade Maw, MD;  Location: [facility];  Service: Neurosurgery;  Laterality: N/A;    SOCIAL HISTORY: Social History   Socioeconomic History  . Marital status: Married    Spouse name: Not on file  . Number of children: Not on file  . Years of education: Not on file  . Highest education level: Not on file  Occupational History  . Not on file  Social Needs  . Financial resource strain: Not on file  . Food insecurity:    Worry: Not on file    Inability: Not on file  . Transportation needs:    Medical: Not on file    Non-medical: Not on file  Tobacco Use  . Smoking status: Former Smoker    Packs/day: 2.00    Years: 37.00    Pack years: 74.00    Types: Cigarettes    Last attempt to quit: 07/19/2012    Years since quitting: 6.0  . Smokeless tobacco: Never Used  Substance and Sexual Activity  . Alcohol use: Yes    Comment: 2 drinks/mo  . Drug use: No  . Sexual activity: Yes  Lifestyle  . Physical activity:    Days per  week: Not on file    Minutes per session: Not on file  . Stress: Not on file  Relationships  . Social connections:    Talks on phone: Not on file    Gets together: Not on file    Attends religious service: Not on file    Active member of club or organization: Not on file    Attends meetings of clubs or organizations: Not on file    Relationship status: Not on file  . Intimate partner violence:    Fear of current or ex partner: Not on file    Emotionally abused: Not on  file    Physically abused: Not on file    Forced sexual activity: Not on file  Other Topics Concern  . Not on file  Social History Narrative  . Not on file    FAMILY HISTORY: Family History  Problem Relation Age of Onset  . Breast cancer Cousin        mat cousin    ALLERGIES:  is allergic to hydrocodone-acetaminophen.  MEDICATIONS:  Current Outpatient Medications  Medication Sig Dispense Refill  . ADVAIR DISKUS 250-50 MCG/DOSE AEPB USE 1 INHALATION TWICE A DAY 180 each 4  . albuterol (PROVENTIL HFA;VENTOLIN HFA) 108 (90 Base) MCG/ACT inhaler Inhale 1-2 puffs into the lungs every 4 (four) hours as needed for wheezing or shortness of breath.     Marland Kitchen buPROPion (WELLBUTRIN SR) 200 MG 12 hr tablet TAKE 1 TABLET TWICE A DAY 180 tablet 4  . gabapentin (NEURONTIN) 300 MG capsule Take 300 mg 3 (three) times daily by mouth.     Marland Kitchen ipratropium-albuterol (DUONEB) 0.5-2.5 (3) MG/3ML SOLN Inhale 3 mLs into the lungs every 4 (four) hours as needed (Only uses with cold symptoms).     Marland Kitchen lamoTRIgine (LAMICTAL) 100 MG tablet Take 3 tablets (300 mg total) by mouth daily. 270 tablet 4  . lisdexamfetamine (VYVANSE) 70 MG capsule Take 1 capsule (70 mg total) by mouth daily. 90 capsule 0  . losartan-hydrochlorothiazide (HYZAAR) 50-12.5 MG tablet Take 1 tablet by mouth at bedtime.     Marland Kitchen lubiprostone (AMITIZA) 24 MCG capsule Take 24 mcg by mouth daily with breakfast.     . MAGNESIUM PO Take 500 mg by mouth every evening.     . milk thistle 175 MG tablet Take 175 mg by mouth every evening.     . montelukast (SINGULAIR) 10 MG tablet Take 10 mg by mouth at bedtime.     . Multiple Vitamins-Minerals (CENTRUM SILVER PO) Take 1 tablet by mouth daily.     . Multiple Vitamins-Minerals (CENTRUM SILVER PO) Take 1 tablet by mouth daily.    Marland Kitchen omeprazole (PRILOSEC) 40 MG capsule Take 40 mg by mouth daily.     Marland Kitchen orlistat (ALLI) 60 MG capsule Take 60 mg by mouth 2 (two) times daily with a meal.    . oxyCODONE (OXY  IR/ROXICODONE) 5 MG immediate release tablet Take 1 tablet (5 mg total) by mouth every 3 (three) hours as needed for moderate pain ((score 4 to 6)). 30 tablet 0  . SPIRIVA RESPIMAT 2.5 MCG/ACT AERS Inhale 2 puffs into the lungs every morning.      No current facility-administered medications for this visit.       Marland Kitchen  PHYSICAL EXAMINATION: ECOG PERFORMANCE STATUS: {CHL ONC ECOG PS:[id number]}  There were no vitals filed for this visit. There were no vitals filed for this visit.  Physical Exam  Constitutional: She is oriented to person,  place, and time and well-developed, well-nourished, and in no distress.  HENT:  Head: Normocephalic and atraumatic.  Mouth/Throat: Oropharynx is clear and moist. No oropharyngeal exudate.  Eyes: Pupils are equal, round, and reactive to light.  Neck: Normal range of motion. Neck supple.  Cardiovascular: Normal rate and regular rhythm.  Pulmonary/Chest: No respiratory distress. She has no wheezes.  Abdominal: Soft. Bowel sounds are normal. She exhibits no distension and no mass. There is no abdominal tenderness. There is no rebound and no guarding.  Musculoskeletal: Normal range of motion.        General: No tenderness or edema.  Neurological: She is alert and oriented to person, place, and time.  Skin: Skin is warm.  Psychiatric: Affect normal.     LABORATORY DATA:  I have reviewed the data as listed Lab Results  Component Value Date   WBC 5.6 01/24/2018   HGB 13.0 01/24/2018   HCT 40.1 01/24/2018   MCV 87.6 01/24/2018   PLT 381 01/24/2018   Recent Labs    12/09/17 2156 12/10/17 0431 01/24/18 1045  NA 133* 139 139  K 4.0 3.7 4.1  CL 97* 108 105  CO2 27 26 26   GLUCOSE 129* 116* 96  BUN 18 14 36*  CREATININE 0.58 0.55 0.63  CALCIUM 8.8* 7.9* 9.6  GFRNONAA >60 >60 >60  GFRAA >60 >60 >60  PROT 7.2  --  7.2  ALBUMIN 3.5  --  4.1  AST 30  --  20  ALT 41  --  20  ALKPHOS 104  --  89  BILITOT 0.7  --  0.6    RADIOGRAPHIC  STUDIES: I have personally reviewed the radiological images as listed and agreed with the findings in the report. No results found.  ASSESSMENT & PLAN:   No problem-specific Assessment & Plan notes found for this encounter.    All questions were answered. The patient knows to call the clinic with any problems, questions or concerns.       Cammie Sickle, MD 07/25/2018 8:02 AM

## 2018-07-30 ENCOUNTER — Inpatient Hospital Stay (HOSPITAL_BASED_OUTPATIENT_CLINIC_OR_DEPARTMENT_OTHER): Admitting: Internal Medicine

## 2018-07-30 ENCOUNTER — Inpatient Hospital Stay: Attending: Internal Medicine

## 2018-07-30 ENCOUNTER — Encounter: Payer: Self-pay | Admitting: Internal Medicine

## 2018-07-30 ENCOUNTER — Other Ambulatory Visit: Payer: Self-pay

## 2018-07-30 DIAGNOSIS — C7A8 Other malignant neuroendocrine tumors: Secondary | ICD-10-CM

## 2018-07-30 DIAGNOSIS — J449 Chronic obstructive pulmonary disease, unspecified: Secondary | ICD-10-CM

## 2018-07-30 DIAGNOSIS — Z87891 Personal history of nicotine dependence: Secondary | ICD-10-CM | POA: Insufficient documentation

## 2018-07-30 DIAGNOSIS — I10 Essential (primary) hypertension: Secondary | ICD-10-CM | POA: Diagnosis not present

## 2018-07-30 DIAGNOSIS — Z79899 Other long term (current) drug therapy: Secondary | ICD-10-CM

## 2018-07-30 DIAGNOSIS — R197 Diarrhea, unspecified: Secondary | ICD-10-CM | POA: Insufficient documentation

## 2018-07-30 DIAGNOSIS — D3A098 Benign carcinoid tumors of other sites: Secondary | ICD-10-CM | POA: Insufficient documentation

## 2018-07-30 LAB — CBC WITH DIFFERENTIAL/PLATELET
Abs Immature Granulocytes: 0.02 10*3/uL (ref 0.00–0.07)
Basophils Absolute: 0.1 10*3/uL (ref 0.0–0.1)
Basophils Relative: 1 %
Eosinophils Absolute: 0.1 10*3/uL (ref 0.0–0.5)
Eosinophils Relative: 1 %
HCT: 40.7 % (ref 36.0–46.0)
Hemoglobin: 13 g/dL (ref 12.0–15.0)
Immature Granulocytes: 0 %
Lymphocytes Relative: 39 %
Lymphs Abs: 3 10*3/uL (ref 0.7–4.0)
MCH: 29.1 pg (ref 26.0–34.0)
MCHC: 31.9 g/dL (ref 30.0–36.0)
MCV: 91.1 fL (ref 80.0–100.0)
Monocytes Absolute: 0.8 10*3/uL (ref 0.1–1.0)
Monocytes Relative: 10 %
Neutro Abs: 3.7 10*3/uL (ref 1.7–7.7)
Neutrophils Relative %: 49 %
Platelets: 310 10*3/uL (ref 150–400)
RBC: 4.47 MIL/uL (ref 3.87–5.11)
RDW: 15.2 % (ref 11.5–15.5)
WBC: 7.7 10*3/uL (ref 4.0–10.5)
nRBC: 0 % (ref 0.0–0.2)

## 2018-07-30 LAB — COMPREHENSIVE METABOLIC PANEL
ALT: 32 U/L (ref 0–44)
AST: 28 U/L (ref 15–41)
Albumin: 4.3 g/dL (ref 3.5–5.0)
Alkaline Phosphatase: 70 U/L (ref 38–126)
Anion gap: 8 (ref 5–15)
BUN: 19 mg/dL (ref 6–20)
CO2: 28 mmol/L (ref 22–32)
Calcium: 9.2 mg/dL (ref 8.9–10.3)
Chloride: 101 mmol/L (ref 98–111)
Creatinine, Ser: 0.75 mg/dL (ref 0.44–1.00)
GFR calc Af Amer: >60 mL/min (ref >60)
GFR calc non Af Amer: >60 mL/min (ref >60)
Glucose, Bld: 107 mg/dL — ABNORMAL HIGH (ref 70–99)
Potassium: 3.7 mmol/L (ref 3.5–5.1)
Sodium: 137 mmol/L (ref 135–145)
Total Bilirubin: 0.6 mg/dL (ref 0.3–1.2)
Total Protein: 7.3 g/dL (ref 6.5–8.1)

## 2018-07-30 NOTE — Progress Notes (Signed)
[facility] NOTE  Patient Care Team: [name], MD as PCP - General (Family Medicine) Ellison Hughs Chrissie Noa, MD (Family Medicine)  CHIEF COMPLAINTS/PURPOSE OF CONSULTATION:  Carcinoid  #  Oncology History   # s/p resection in 07/19/2012 in [place], [place].  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  Proximal and distal margins were negative.  Lymph nodes were negative.  MRCP on 04/23/2012 in [place], [place], revealed features of choledocholithiasis with a 1.2 cm ovoid filling defect within the mid segment of the CBD with associated intrahepatic and proximal extrahepatic biliary ductal dilatation. 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    HISTORY OF PRESENTING ILLNESS:  Bailey Flores 58 y.o.  female is here for follow-up for history of pancreas/bile duct carcinoma.  Patient denies any abdominal pain nausea vomiting.  Denies any skin rash or itching.  Mild intermittent diarrhea.  This is chronic.  Not any worse.  No flushing of the skin.   No swelling in the legs.  No weight loss.   Review of Systems  Constitutional: Negative for chills, diaphoresis, fever, malaise/fatigue and weight loss.  HENT: Negative for nosebleeds and sore throat.   Eyes: Negative for double vision.  Respiratory: Negative for cough, hemoptysis, sputum production, shortness of breath and wheezing.   Cardiovascular: Negative for chest pain, palpitations, orthopnea and leg swelling.  Gastrointestinal: Positive for diarrhea. Negative for abdominal pain, blood in stool, constipation, heartburn, melena, nausea and vomiting.  Genitourinary: Negative for dysuria, frequency and urgency.  Musculoskeletal: Negative for back pain and joint pain.  Skin: Negative.  Negative for itching and rash.  Neurological: Negative for dizziness, tingling, focal weakness, weakness and headaches.  Endo/Heme/Allergies: Does not bruise/bleed easily.  Psychiatric/Behavioral: Negative for depression. The patient is not nervous/anxious and does not have insomnia.      MEDICAL HISTORY:  Past Medical History:  Diagnosis Date  . Arthritis    hands  . Asthma   . Barrett's esophagus   . Cancer [facility])    neuroendocrine ca  . COPD (chronic obstructive pulmonary disease) ([place])   . Depression   . GERD (gastroesophageal reflux disease)   . Hypertension   . Motion sickness    reading in car  . 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 Pneumonia 2017  . Sleep apnea     SURGICAL HISTORY: Past Surgical History:  Procedure Laterality Date  . ABDOMINAL HYSTERECTOMY    . BOWEL RESECTION  07/19/2012  . CESAREAN SECTION     x3  . ESOPHAGOGASTRODUODENOSCOPY (EGD) WITH PROPOFOL N/A 03/05/2017   Procedure: ESOPHAGOGASTRODUODENOSCOPY (EGD) WITH PROPOFOL;  Surgeon: [name], MD;  Location: [facility];  Service: Endoscopy;  Laterality: N/A;  . ESOPHAGOGASTRODUODENOSCOPY (EGD) WITH PROPOFOL N/A 03/30/2017  REPEAT IN 03/2019  . HAND SURGERY Right   . TRANSFORAMINAL LUMBAR  INTERBODY FUSION (TLIF) WITH PEDICLE SCREW FIXATION 1 LEVEL N/A 12/05/2017   Procedure: TRANSFORAMINAL LUMBAR INTERBODY FUSION (TLIF) WITH PEDICLE SCREW FIXATION 1 LEVEL-L4-5;  Surgeon: Meade Maw, MD;  Location: [facility];  Service: Neurosurgery;  Laterality: N/A;    SOCIAL HISTORY: Social History   Socioeconomic History  . Marital status: Married    Spouse name: Not on file  . Number of children: Not on file  . Years of education: Not on file  . Highest education level: Not on file  Occupational History  . Not on file  Social Needs  . Financial resource strain: Not on file  . Food insecurity:    Worry: Not on file    Inability: Not on file  . Transportation needs:    Medical: Not on file    Non-medical: Not on file  Tobacco Use  . Smoking status: Former Smoker    Packs/day: 2.00    Years: 37.00    Pack years: 74.00    Types: Cigarettes    Last attempt to quit: 07/19/2012    Years since quitting: 6.0  . Smokeless tobacco: Never Used  Substance and Sexual Activity  . Alcohol use: Yes    Comment: 2 drinks/mo  . Drug use: No  . Sexual activity: Yes  Lifestyle  . Physical activity:    Days per week: Not on file    Minutes per session: Not on file  . Stress: Not on file  Relationships  . Social connections:    Talks on phone: Not on file    Gets together: Not on file    Attends religious service: Not on file    Active member of club or organization: Not on file    Attends meetings of clubs or organizations: Not on file    Relationship status: Not on file  . Intimate partner violence:    Fear of current or ex partner: Not on file    Emotionally abused: Not on file    Physically abused: Not on file    Forced sexual activity: Not on file  Other Topics Concern  . Not on file  Social History Narrative  . Not on file    FAMILY HISTORY: Family History  Problem Relation Age of Onset  . Breast cancer Cousin        mat cousin    ALLERGIES:  is allergic to  hydrocodone-acetaminophen.  MEDICATIONS:  Current Outpatient Medications  Medication Sig Dispense Refill  . ADVAIR DISKUS 250-50 MCG/DOSE AEPB USE 1 INHALATION TWICE A DAY 180 each 4  . albuterol (PROVENTIL HFA;VENTOLIN HFA) 108 (90 Base) MCG/ACT inhaler Inhale 1-2 puffs into the lungs every 4 (four) hours as needed for wheezing or shortness of breath.     Marland Kitchen buPROPion (WELLBUTRIN SR) 200 MG 12 hr tablet TAKE 1 TABLET TWICE A DAY 180 tablet 4  . gabapentin (NEURONTIN) 300 MG capsule Take 300 mg 3 (three) times daily by mouth.     . lamoTRIgine (LAMICTAL) 100 MG tablet Take 3 tablets (300 mg total) by mouth daily. 270 tablet 4  . lisdexamfetamine (VYVANSE) 70 MG capsule Take 1 capsule (70 mg total) by mouth daily. 90 capsule 0  . losartan-hydrochlorothiazide (HYZAAR) 50-12.5 MG tablet Take 1 tablet by mouth at bedtime.     Marland Kitchen lubiprostone (AMITIZA) 24 MCG capsule Take 24 mcg by mouth daily with breakfast.     . MAGNESIUM PO Take  500 mg by mouth every evening.     . milk thistle 175 MG tablet Take 175 mg by mouth every evening.     . montelukast (SINGULAIR) 10 MG tablet Take 10 mg by mouth at bedtime.     . Multiple Vitamins-Minerals (CENTRUM SILVER PO) Take 1 tablet by mouth daily.     . Multiple Vitamins-Minerals (CENTRUM SILVER PO) Take 1 tablet by mouth daily.    Marland Kitchen omeprazole (PRILOSEC) 40 MG capsule Take 40 mg by mouth daily.     Marland Kitchen orlistat (ALLI) 60 MG capsule Take 60 mg by mouth 2 (two) times daily with a meal.    . oxyCODONE (OXY IR/ROXICODONE) 5 MG immediate release tablet Take 1 tablet (5 mg total) by mouth every 3 (three) hours as needed for moderate pain ((score 4 to 6)). 30 tablet 0  . SPIRIVA RESPIMAT 2.5 MCG/ACT AERS Inhale 2 puffs into the lungs every morning.     Marland Kitchen ipratropium-albuterol (DUONEB) 0.5-2.5 (3) MG/3ML SOLN Inhale 3 mLs into the lungs every 4 (four) hours as needed (Only uses with cold symptoms).      No current facility-administered medications for this visit.        Marland Kitchen  PHYSICAL EXAMINATION: ECOG PERFORMANCE STATUS: 0 - Asymptomatic  Vitals:   07/30/18 0951  BP: 118/86  Pulse: 96  Resp: 16  Temp: 97.6 F (36.4 C)   Filed Weights   07/30/18 0951  Weight: 137 lb (62.1 kg)    Physical Exam  Constitutional: She is oriented to person, place, and time and well-developed, well-nourished, and in no distress.  HENT:  Head: Normocephalic and atraumatic.  Mouth/Throat: Oropharynx is clear and moist. No oropharyngeal exudate.  Eyes: Pupils are equal, round, and reactive to light.  Neck: Normal range of motion. Neck supple.  Cardiovascular: Normal rate and regular rhythm.  Pulmonary/Chest: No respiratory distress. She has no wheezes.  Abdominal: Soft. Bowel sounds are normal. She exhibits no distension and no mass. There is no abdominal tenderness. There is no rebound and no guarding.  Musculoskeletal: Normal range of motion.        General: No tenderness or edema.  Neurological: She is alert and oriented to person, place, and time.  Skin: Skin is warm.  Psychiatric: Affect normal.     LABORATORY DATA:  I have reviewed the data as listed Lab Results  Component Value Date   WBC 7.7 07/30/2018   HGB 13.0 07/30/2018   HCT 40.7 07/30/2018   MCV 91.1 07/30/2018   PLT 310 07/30/2018   Recent Labs    12/09/17 2156 12/10/17 0431 01/24/18 1045 07/30/18 0916  NA 133* 139 139 137  K 4.0 3.7 4.1 3.7  CL 97* 108 105 101  CO2 27 26 26 28   GLUCOSE 129* 116* 96 107*  BUN 18 14 36* 19  CREATININE 0.58 0.55 0.63 0.75  CALCIUM 8.8* 7.9* 9.6 9.2  GFRNONAA >60 >60 >60 >60  GFRAA >60 >60 >60 >60  PROT 7.2  --  7.2 7.3  ALBUMIN 3.5  --  4.1 4.3  AST 30  --  20 28  ALT 41  --  20 32  ALKPHOS 104  --  89 70  BILITOT 0.7  --  0.6 0.6    RADIOGRAPHIC STUDIES: I have personally reviewed the radiological images as listed and agreed with the findings in the report. No results found.  ASSESSMENT & PLAN:   Carcinoid tumor of  pancreas #Pancreatic neuroendocrine-low-grade carcinoid; clinically node is  of recurrence.  However tumor marker slightly elevated 9-11.  Awaiting labs from today.  Discussed that if the labs are significantly abnormal would recommend a gallium PET scan.  #History of smoking/COPD stable; continue lung cancer screening program.  #Diarrhea intermittent-do not suspect from history of carcinoid.  Urine 5-HIAA December 2018 normal.  Stable.  # DISPOSITION:  # follow up in 6 months- MD/labs- cbc/cmp/chromogranin-A- Dr.B  All questions were answered. The patient knows to call the clinic with any problems, questions or concerns.  # 25 minutes face-to-face with the patient discussing the above plan of care; more than 50% of time spent on prognosis/ natural history; counseling and coordination.     Cammie Sickle, MD 07/30/2018 10:46 AM

## 2018-07-30 NOTE — Assessment & Plan Note (Deleted)
#   Carcinoid of pancreas s/p resection [2014]; Chromogranin-A- awaiting from today; if worse- recommend Ga-PET scan.   # ? Diarrhea-   # Hx of smoking- on LCSP  # DISPOSITION:  # follow up in 6 months- MD/labs- cbc/cmp/chromogranin-A- Dr.B

## 2018-07-30 NOTE — Assessment & Plan Note (Signed)
#  Pancreatic neuroendocrine-low-grade carcinoid; clinically node is of recurrence.  However tumor marker slightly elevated 9-11.  Awaiting labs from today.  Discussed that if the labs are significantly abnormal would recommend a gallium PET scan.  #History of smoking/COPD stable; continue lung cancer screening program.  #Diarrhea intermittent-do not suspect from history of carcinoid.  Urine 5-HIAA December 2018 normal.  Stable.  # DISPOSITION:  # follow up in 6 months- MD/labs- cbc/cmp/chromogranin-A- Dr.B

## 2018-08-02 LAB — CHROMOGRANIN A REBASELINE
Chromogranin A (ng/mL): 792.1 ng/mL — ABNORMAL HIGH (ref 0.0–101.8)
Chromogranin A: 20 nmol/L — ABNORMAL HIGH (ref 0–5)

## 2018-08-06 ENCOUNTER — Telehealth: Payer: Self-pay | Admitting: Internal Medicine

## 2018-08-06 DIAGNOSIS — C7A8 Other malignant neuroendocrine tumors: Secondary | ICD-10-CM

## 2018-08-06 NOTE — Telephone Encounter (Signed)
Spoke to pt re: results of the blood -elevated chromogranin A;  Recommend PET ga scan in 2 months [sec to covid 19/ pt-family aware-agree]; follow up after 1-2 days after the scan.   GB

## 2018-08-07 ENCOUNTER — Ambulatory Visit: Admitting: Licensed Clinical Social Worker

## 2018-08-12 NOTE — Progress Notes (Signed)
   THERAPIST PROGRESS NOTE  Session Time: 77mn  Participation Level: Active  Behavioral Response: CasualAlertEuthymic  Type of Therapy: Individual Therapy  Treatment Goals addressed: Coping  Interventions: CBT and Motivational Interviewing  Summary: MJoyice Magdais a 58y.o. female who presents with her husband.  Therapist met with Patient in an outpatient setting to assess current mood and assist with making progress towards goals through the use of therapeutic intervention. Therapist did a brief mood check, assessing anger, fear, disgust, excitement, happiness, and sadness.  Patient reports that she occassionally gets sad.  Patient reports that [facility].  Reports that she feels pulled between her husband and her daughter.  Reports that her husband does not want her daughter tom ove back into the home.  Explored thoughts and feelings of Patient and her husband.  Discussed expectations and made a list of realistic and unrealistic expectations.   Suicidal/Homicidal: No  Plan: Return again in 2 weeks.  Diagnosis: Axis I: Bipolar, Depressed    Axis II: No diagnosis    NLubertha South LCSW 05/20/2018

## 2018-08-14 NOTE — Progress Notes (Signed)
   THERAPIST PROGRESS NOTE  Session Time: 13mn  Participation Level: Active  Behavioral Response: CasualAlertEuthymic  Type of Therapy: Individual Therapy  Treatment Goals addressed: Anxiety  Interventions: CBT and Motivational Interviewing  Summary: Bailey Flores a 58y.o. female who presents with a reduction in symptoms. Therapist met with Patient in an outpatient setting to assess current mood and assist with making progress towards goals through the use of therapeutic intervention. Therapist did a brief mood check, assessing anger, fear, disgust, excitement, happiness, and sadness.  Patient reports an increase in anxiety (fidgety, nervousness).  Provided support for Patient as she discussed her current mood. Stressed the importance of mood stabilization through learned coping skills and medication management.  Encouraged Patient to focus on her strengths and the positive aspects.   Suicidal/Homicidal: No  Plan: Return again in 2 weeks.  Diagnosis: Axis I: Bipolar, Depressed    Axis II: No diagnosis    NLubertha South LCSW 06/20/2018

## 2018-08-20 ENCOUNTER — Telehealth: Payer: Self-pay | Admitting: *Deleted

## 2018-08-20 ENCOUNTER — Other Ambulatory Visit: Payer: Self-pay | Admitting: Internal Medicine

## 2018-08-20 DIAGNOSIS — C7A8 Other malignant neuroendocrine tumors: Secondary | ICD-10-CM

## 2018-08-20 NOTE — Telephone Encounter (Signed)
Patient called reporting that she is scheduled for a PET scan in May, but was told if she starts having pain to call and we would move her scan up sooner. She is having pain and would like to have her scan moved up ASAP. Please advise

## 2018-08-20 NOTE — Progress Notes (Signed)
Spoke to pt- having worsening abdominal pain/ rising tumor marker.  Will order Ga-PET in 1 week  Tele-health 1-2 days after.

## 2018-08-29 ENCOUNTER — Ambulatory Visit

## 2018-08-29 ENCOUNTER — Other Ambulatory Visit: Payer: Self-pay

## 2018-08-29 ENCOUNTER — Ambulatory Visit
Admission: RE | Admit: 2018-08-29 | Discharge: 2018-08-29 | Disposition: A | Discharge status: Home / Self Care | Source: Ambulatory Visit | Attending: Internal Medicine | Admitting: Internal Medicine

## 2018-08-29 DIAGNOSIS — C7A8 Other malignant neuroendocrine tumors: Secondary | ICD-10-CM | POA: Insufficient documentation

## 2018-08-29 MED ORDER — GALLIUM GA 68 DOTATATE IV KIT
5.4000 | PACK | Freq: Once | INTRAVENOUS | Status: AC | PRN
  Administered 2018-08-29: 5.62 via INTRAVENOUS

## 2018-08-30 ENCOUNTER — Telehealth: Payer: Self-pay | Admitting: Internal Medicine

## 2018-08-30 ENCOUNTER — Inpatient Hospital Stay: Admitting: Internal Medicine

## 2018-08-30 NOTE — Telephone Encounter (Signed)
I left message for patient to return phone call.   

## 2018-08-30 NOTE — Telephone Encounter (Signed)
I called radiology as report is not up yet.  Hopefully.  Early next week.  H/B/C- Please cancel the appointment for today/tell the patient the reason as mentioned above.   Please reschedule tele  appointment to Thursday or Friday next week.   Thx  GB

## 2018-09-04 ENCOUNTER — Other Ambulatory Visit: Payer: Self-pay

## 2018-09-05 ENCOUNTER — Encounter: Payer: Self-pay | Admitting: Internal Medicine

## 2018-09-05 ENCOUNTER — Inpatient Hospital Stay: Attending: Internal Medicine | Admitting: Internal Medicine

## 2018-09-05 DIAGNOSIS — C7A8 Other malignant neuroendocrine tumors: Secondary | ICD-10-CM

## 2018-09-05 DIAGNOSIS — D3A098 Benign carcinoid tumors of other sites: Secondary | ICD-10-CM | POA: Diagnosis not present

## 2018-09-05 NOTE — Assessment & Plan Note (Deleted)
#  Pancreatic neuroendocrine-low-grade carcinoid; clinically node is of recurrence.  However tumor marker slightly elevated 9-11.  Awaiting labs from today.  Discussed that if the labs are significantly abnormal would recommend a gallium PET scan.  #History of smoking/COPD stable; continue lung cancer screening program.  #Diarrhea intermittent-do not suspect from history of carcinoid.  Urine 5-HIAA December 2018 normal.  Stable. ---------------------------------------  # abdominal pain--  # cannot empty bladder- urologist---defer pp.   # DISPOSITION:  # follow up in 6 months- MD/labs- cbc/cmp/chromogranin-A- Dr.B

## 2018-09-05 NOTE — Progress Notes (Signed)
I connected with Bailey Flores on 09/05/18 at  8:30 AM EDT by video enabled telemedicine visit and verified that I am speaking with the correct person using two identifiers.  I discussed the limitations, risks, security and privacy concerns of performing an evaluation and management service by telemedicine and the availability of in-person appointments. I also discussed with the patient that there may be a patient responsible charge related to this service. The patient expressed understanding and agreed to proceed.    Other persons participating in the visit and their role in the encounter: husband  Patient's location: home  Provider's location: office   Oncology History   # s/p resection in 07/19/2012 in [place], [place].  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  Proximal and distal margins were negative.  Lymph nodes were negative.  MRCP on 04/23/2012 in [place], [place], revealed features of choledocholithiasis with a 1.2 cm ovoid filling defect within the mid segment of the CBD with associated intrahepatic and proximal extrahepatic biliary ductal dilatation. 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  female with history of above summarized pancreas/bile duct neuroendocrine carcinoid.  Patient had a PET scan for further evaluation.  Patient complains of vague abdominal pain/discomfort at site of her abdominal surgery.  Denies any skin flushing or any rash.  No diarrhea.  Complains of difficulty with voiding-symptoms of incomplete bladder voiding.  Denies any burning pain or foul-smelling urine or fevers or chills.  Observation/objective: PET scan reviewed-no evidence of disease.  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  #Long discussion the patient regarding the results of the PET scan and the discrepant tumor marker/chromogranin A.  See below.  For now recommend surveillance.  #Elevated chromogranin A levels-discussed multiple other reasons including use of PPI.  However this will be monitored.  #Incomplete bladder emptying-no evidence of malignancy causing urologic issues.  Discussed with the patient.  Recommend discussion with PCP/referral to urology.  Patient wants to wait.  #History of smoking/COPD stable /continue lung cancer screening program-   # DISPOSITION:  # follow up in 6 months- MD/labs- cbc/cmp/chromogranin-A- Dr.B    Follow-up instructions:  I discussed the assessment and treatment plan with the patient.  The patient was provided an opportunity to ask questions and all were answered.  The patient agreed with the plan and demonstrated understanding of instructions.  The patient was advised to call back or seek an in person evaluation if the symptoms worsen or if the condition fails to improve as anticipated.   Dr. Charlaine Dalton [place] at [facility] 09/05/2018 1:34 PM

## 2018-09-05 NOTE — Assessment & Plan Note (Addendum)
#  Pancreatic/biliary duct neuroendocrine-low-grade carcinoid; April 2020-PET scan no evidence of disease.  #Long discussion the patient regarding the results of the PET scan and the discrepant tumor marker/chromogranin A.  See below.  For now recommend surveillance.  #Elevated chromogranin A levels-discussed multiple other reasons including use of PPI.  However this will be monitored.  #Incomplete bladder emptying-no evidence of malignancy causing urologic issues.  Discussed with the patient.  Recommend discussion with PCP/referral to urology.  Patient wants to wait.  #History of smoking/COPD stable /continue lung cancer screening program-   # DISPOSITION:  # follow up in 6 months- MD/labs- cbc/cmp/chromogranin-A- Dr.B

## 2018-10-02 ENCOUNTER — Other Ambulatory Visit

## 2018-10-04 ENCOUNTER — Ambulatory Visit: Admitting: Internal Medicine

## 2018-10-07 ENCOUNTER — Telehealth: Payer: Self-pay

## 2018-10-07 DIAGNOSIS — F900 Attention-deficit hyperactivity disorder, predominantly inattentive type: Secondary | ICD-10-CM

## 2018-10-07 MED ORDER — LISDEXAMFETAMINE DIMESYLATE 70 MG PO CAPS: 70.0000 mg | ORAL_CAPSULE | Freq: Every day | ORAL | 0 refills | Status: DC

## 2018-10-07 NOTE — Telephone Encounter (Signed)
pt called left message needs refill on vyvanse.

## 2018-10-07 NOTE — Telephone Encounter (Signed)
SENT Vyvanse.

## 2019-01-03 ENCOUNTER — Ambulatory Visit (INDEPENDENT_AMBULATORY_CARE_PROVIDER_SITE_OTHER): Admitting: Psychiatry

## 2019-01-03 ENCOUNTER — Other Ambulatory Visit: Payer: Self-pay

## 2019-01-03 ENCOUNTER — Encounter: Payer: Self-pay | Admitting: Psychiatry

## 2019-01-03 DIAGNOSIS — F900 Attention-deficit hyperactivity disorder, predominantly inattentive type: Secondary | ICD-10-CM | POA: Diagnosis not present

## 2019-01-03 DIAGNOSIS — F3131 Bipolar disorder, current episode depressed, mild: Secondary | ICD-10-CM | POA: Insufficient documentation

## 2019-01-03 DIAGNOSIS — F3176 Bipolar disorder, in full remission, most recent episode depressed: Secondary | ICD-10-CM

## 2019-01-03 MED ORDER — LISDEXAMFETAMINE DIMESYLATE 70 MG PO CAPS: 70.0000 mg | ORAL_CAPSULE | Freq: Every day | ORAL | 0 refills | Status: DC

## 2019-01-03 NOTE — Progress Notes (Signed)
Virtual Visit via Video Note  I connected with Bailey Flores on 01/03/19 at 11:00 AM EDT by a video enabled telemedicine application and verified that I am speaking with the correct person using two identifiers.   I discussed the limitations of evaluation and management by telemedicine and the availability of in person appointments. The patient expressed understanding and agreed to proceed.   I discussed the assessment and treatment plan with the patient. The patient was provided an opportunity to ask questions and all were answered. The patient agreed with the plan and demonstrated an understanding of the instructions.   The patient was advised to call back or seek an in-person evaluation if the symptoms worsen or if the condition fails to improve as anticipated.   [place] MD OP Progress Note  01/03/2019 12:22 PM Bailey Flores  MRN:  [id number]  Chief Complaint:  Chief Complaint    Follow-up     HPI: Bailey Flores is a 59 year old female, married, unemployed, has a history of bipolar disorder, ADHD, cognitive disorder, COPD, hypertension, chronic pain was evaluated by telemedicine today.  Patient reports she is currently struggling with some relationship struggles with her daughter.  Her daughter moved out of their home.  She reports her daughter struggles with substance abuse as well as mental health problems.  She is currently living with friends somewhere close to [place].  She reports she and her husband tried to support her daughter and tried to help her go in the right direction but she did not want to accept their support.  Patient reports it is always hard for her to cope with the fact that her daughter is not doing well.  She however reports ever since her daughter is out of the house she and her husband feel more peaceful.  She reports they can now take a bike ride or go places without being worried about who the daughter may have brought into their home while they were away.  Patient  reports she is currently doing well mood wise with her medications.  She denies any side effects to her medications.  She is compliant on them.  She reports sleep is good.  She reports she continues to have good support system from her husband.  They go on bike rides together and they call it their " wind therapy.'  Patient reports that has been very therapeutic for them.  Patient also reports she used to work with Ms. Royal Piedra, therapist here in clinic and she can call her if she needs someone to talk to.  She denies any other concerns today. Visit Diagnosis:    ICD-10-CM   1. Bipolar 1 disorder, depressed, full remission ([place])  F31.76   2. Attention deficit hyperactivity disorder (ADHD), predominantly inattentive type  F90.0 lisdexamfetamine (VYVANSE) 70 MG capsule    Past Psychiatric History: I have reviewed past psychiatric history from my progress note on 08/10/2017.  Past Medical History:  Past Medical History:  Diagnosis Date  . Arthritis    hands  . Asthma   . Barrett's esophagus   . Cancer [facility])    neuroendocrine ca  . COPD (chronic obstructive pulmonary disease) ([place])   . Depression   . GERD (gastroesophageal reflux disease)   . Hypertension   . Motion sickness    reading in car  . Neuroendocrine carcinoma ([place]) 01/16/2017  . Pneumonia 2017  . Sleep apnea     Past Surgical History:  Procedure Laterality Date  . ABDOMINAL HYSTERECTOMY    .  BOWEL RESECTION  07/19/2012  . CESAREAN SECTION     x3  . ESOPHAGOGASTRODUODENOSCOPY (EGD) WITH PROPOFOL N/A 03/05/2017   Procedure: ESOPHAGOGASTRODUODENOSCOPY (EGD) WITH PROPOFOL;  Surgeon: [name], MD;  Location: [place];  Service: Endoscopy;  Laterality: N/A;  . ESOPHAGOGASTRODUODENOSCOPY (EGD) WITH PROPOFOL N/A 03/30/2017   REPEAT IN 03/2019  . HAND SURGERY Right   . TRANSFORAMINAL LUMBAR INTERBODY FUSION (TLIF) WITH PEDICLE SCREW FIXATION 1 LEVEL N/A 12/05/2017   Procedure: TRANSFORAMINAL LUMBAR  INTERBODY FUSION (TLIF) WITH PEDICLE SCREW FIXATION 1 LEVEL-L4-5;  Surgeon: Meade Maw, MD;  Location: [facility];  Service: Neurosurgery;  Laterality: N/A;    Family Psychiatric History: I have reviewed family psychiatric history from my progress note on 08/10/2017.  Family History:  Family History  Problem Relation Age of Onset  . Breast cancer Cousin        mat cousin    Social History: I have reviewed social history from my progress note on 08/10/2017. Social History   Socioeconomic History  . Marital status: Married    Spouse name: Not on file  . Number of children: Not on file  . Years of education: Not on file  . Highest education level: Not on file  Occupational History  . Not on file  Social Needs  . Financial resource strain: Not on file  . Food insecurity    Worry: Not on file    Inability: Not on file  . Transportation needs    Medical: Not on file    Non-medical: Not on file  Tobacco Use  . Smoking status: Former Smoker    Packs/day: 2.00    Years: 37.00    Pack years: 74.00    Types: Cigarettes    Quit date: 07/19/2012    Years since quitting: 6.4  . Smokeless tobacco: Never Used  Substance and Sexual Activity  . Alcohol use: Yes    Comment: 2 drinks/mo  . Drug use: No  . Sexual activity: Yes  Lifestyle  . Physical activity    Days per week: Not on file    Minutes per session: Not on file  . Stress: Not on file  Relationships  . Social Herbalist on phone: Not on file    Gets together: Not on file    Attends religious service: Not on file    Active member of club or organization: Not on file    Attends meetings of clubs or organizations: Not on file    Relationship status: Not on file  Other Topics Concern  . Not on file  Social History Narrative  . Not on file    Allergies:  Allergies  Allergen Reactions  . Hydrocodone-Acetaminophen Itching    Metabolic Disorder Labs: Lab Results  Component Value Date   HGBA1C 5.9  (H) 06/13/2017   MPG 122.63 06/13/2017   Lab Results  Component Value Date   PROLACTIN 4.6 (L) 06/13/2017   Lab Results  Component Value Date   CHOL 183 06/13/2017   TRIG 80 06/13/2017   HDL 92 06/13/2017   CHOLHDL 2.0 06/13/2017   VLDL 16 06/13/2017   LDLCALC 75 06/13/2017   Lab Results  Component Value Date   TSH 1.205 06/13/2017    Therapeutic Level Labs: No results found for: LITHIUM No results found for: VALPROATE No components found for:  CBMZ  Current Medications: Current Outpatient Medications  Medication Sig Dispense Refill  . HYDROcodone-acetaminophen (NORCO/VICODIN) 5-325 MG tablet Take by mouth.    Marland Kitchen  valsartan-hydrochlorothiazide (DIOVAN-HCT) 160-12.5 MG tablet     . ADVAIR DISKUS 250-50 MCG/DOSE AEPB USE 1 INHALATION TWICE A DAY 180 each 4  . albuterol (PROVENTIL HFA;VENTOLIN HFA) 108 (90 Base) MCG/ACT inhaler Inhale 1-2 puffs into the lungs every 4 (four) hours as needed for wheezing or shortness of breath.     Marland Kitchen buPROPion (WELLBUTRIN SR) 200 MG 12 hr tablet TAKE 1 TABLET TWICE A DAY 180 tablet 4  . Cholecalciferol (VITAMIN D3) 25 MCG (1000 UT) CAPS Take by mouth.    . DIOVAN HCT 160-12.5 MG tablet     . gabapentin (NEURONTIN) 300 MG capsule Take 300 mg 3 (three) times daily by mouth.     Marland Kitchen ipratropium-albuterol (DUONEB) 0.5-2.5 (3) MG/3ML SOLN Inhale 3 mLs into the lungs every 4 (four) hours as needed (Only uses with cold symptoms).     Marland Kitchen lamoTRIgine (LAMICTAL) 100 MG tablet Take 3 tablets (300 mg total) by mouth daily. 270 tablet 4  . lisdexamfetamine (VYVANSE) 70 MG capsule Take 1 capsule (70 mg total) by mouth daily. 90 capsule 0  . MAGNESIUM PO Take 500 mg by mouth every evening.     . milk thistle 175 MG tablet Take 175 mg by mouth every evening.     . montelukast (SINGULAIR) 10 MG tablet Take 10 mg by mouth at bedtime.     . Multiple Vitamins-Minerals (CENTRUM SILVER PO) Take 1 tablet by mouth daily.    Marland Kitchen omeprazole (PRILOSEC) 40 MG capsule Take 40 mg  by mouth daily.     Marland Kitchen oxyCODONE (OXY IR/ROXICODONE) 5 MG immediate release tablet Take 1 tablet (5 mg total) by mouth every 3 (three) hours as needed for moderate pain ((score 4 to 6)). 30 tablet 0  . predniSONE (DELTASONE) 10 MG tablet TK 5 TS PO QD FOR 2 DAYS THEN DECREASE BY 1 T Q 2 DAYS UNTIL GONE    . SPIRIVA RESPIMAT 2.5 MCG/ACT AERS Inhale 2 puffs into the lungs every morning.     Marland Kitchen tiZANidine (ZANAFLEX) 4 MG tablet     . valACYclovir (VALTREX) 1000 MG tablet      No current facility-administered medications for this visit.      Musculoskeletal: Strength & Muscle Tone: UTA Gait & Station: normal Patient leans: N/A  Psychiatric Specialty Exam: Review of Systems  Psychiatric/Behavioral: The patient is nervous/anxious.   All other systems reviewed and are negative.   There were no vitals taken for this visit.There is no height or weight on file to calculate BMI.  General Appearance: Casual  Eye Contact:  Fair  Speech:  Clear and Coherent  Volume:  Normal  Mood:  Anxious  Affect:  Appropriate  Thought Process:  Goal Directed and Descriptions of Associations: Intact  Orientation:  Full (Time, Place, and Person)  Thought Content: Logical   Suicidal Thoughts:  No  Homicidal Thoughts:  No  Memory:  Immediate;   Fair Recent;   Fair Remote;   Fair  Judgement:  Fair  Insight:  Fair  Psychomotor Activity:  Normal  Concentration:  Concentration: Fair and Attention Span: Fair  Recall:  [employer] of Knowledge: Fair  Language: Fair  Akathisia:  No  Handed:  Right  AIMS (if indicated): denies tremors, rigidity  Assets:  Communication Skills Housing Social Support  ADL's:  Intact  Cognition: WNL  Sleep:  Fair   Screenings: PHQ2-9     Pulmonary Rehab from 06/25/2017 in [facility]  PHQ-2 Total Score  4  PHQ-9 Total Score  19       Assessment and Plan: Bailey Flores is a 58 year old Hispanic female who has a history of bipolar disorder, ADHD, cognitive  disorder, COPD, GERD, was evaluated by telemedicine today.  Patient reports she is currently making progress on the current medication regimen although struggles with psychosocial stressors of relationship struggles with her daughter who struggles with mental health and substance abuse problems.  She continues to have good support system from her husband and is compliant with medications.  Plan Bipolar disorder in remission Lamictal 300 mg p.o. daily in divided dosage Wellbutrin SR 200 mg p.o. twice daily  For anxiety symptoms-stable Hydroxyzine 25 mg p.o. daily as needed  For ADHD-stable Vyvanse 70 mg p.o. daily I have reviewed [place] controlled substance database.  Patient advised to follow-up with therapist as needed if she continues to struggle with psychosocial stressors as summarized above.  Follow-up in clinic in 3 months or sooner if needed.  December 4 at 10:30 AM  I have spent atleast 15 minutes non  face to face with patient today. More than 50 % of the time was spent for psychoeducation and supportive psychotherapy and care coordination.  This note was generated in part or whole with voice recognition software. Voice recognition is usually quite accurate but there are transcription errors that can and very often do occur. I apologize for any typographical errors that were not detected and corrected.        [name], MD 01/03/2019, 12:22 PM

## 2019-01-16 ENCOUNTER — Other Ambulatory Visit: Payer: Self-pay | Admitting: Orthopedic Surgery

## 2019-01-16 DIAGNOSIS — M5030 Other cervical disc degeneration, unspecified cervical region: Secondary | ICD-10-CM

## 2019-01-16 DIAGNOSIS — M5412 Radiculopathy, cervical region: Secondary | ICD-10-CM

## 2019-01-24 ENCOUNTER — Other Ambulatory Visit: Payer: Self-pay

## 2019-01-24 ENCOUNTER — Encounter: Payer: Self-pay | Admitting: Internal Medicine

## 2019-01-24 DIAGNOSIS — C7A8 Other malignant neuroendocrine tumors: Secondary | ICD-10-CM

## 2019-01-24 NOTE — Progress Notes (Signed)
Patient denies any concerns today.  

## 2019-01-25 ENCOUNTER — Ambulatory Visit
Admission: RE | Admit: 2019-01-25 | Discharge: 2019-01-25 | Disposition: A | Discharge status: Home / Self Care | Source: Ambulatory Visit | Attending: Orthopedic Surgery | Admitting: Orthopedic Surgery

## 2019-01-25 DIAGNOSIS — M5030 Other cervical disc degeneration, unspecified cervical region: Secondary | ICD-10-CM | POA: Insufficient documentation

## 2019-01-25 DIAGNOSIS — M5412 Radiculopathy, cervical region: Secondary | ICD-10-CM | POA: Diagnosis present

## 2019-01-28 ENCOUNTER — Inpatient Hospital Stay: Attending: Internal Medicine

## 2019-01-28 ENCOUNTER — Encounter: Payer: Self-pay | Admitting: Internal Medicine

## 2019-01-28 ENCOUNTER — Other Ambulatory Visit: Payer: Self-pay

## 2019-01-28 ENCOUNTER — Inpatient Hospital Stay (HOSPITAL_BASED_OUTPATIENT_CLINIC_OR_DEPARTMENT_OTHER): Admitting: Internal Medicine

## 2019-01-28 DIAGNOSIS — C7A8 Other malignant neuroendocrine tumors: Secondary | ICD-10-CM | POA: Diagnosis not present

## 2019-01-28 DIAGNOSIS — Z87891 Personal history of nicotine dependence: Secondary | ICD-10-CM | POA: Insufficient documentation

## 2019-01-28 DIAGNOSIS — G4730 Sleep apnea, unspecified: Secondary | ICD-10-CM | POA: Insufficient documentation

## 2019-01-28 DIAGNOSIS — D72829 Elevated white blood cell count, unspecified: Secondary | ICD-10-CM | POA: Insufficient documentation

## 2019-01-28 DIAGNOSIS — Z79899 Other long term (current) drug therapy: Secondary | ICD-10-CM | POA: Insufficient documentation

## 2019-01-28 DIAGNOSIS — I10 Essential (primary) hypertension: Secondary | ICD-10-CM | POA: Diagnosis not present

## 2019-01-28 DIAGNOSIS — K219 Gastro-esophageal reflux disease without esophagitis: Secondary | ICD-10-CM | POA: Diagnosis not present

## 2019-01-28 DIAGNOSIS — J449 Chronic obstructive pulmonary disease, unspecified: Secondary | ICD-10-CM | POA: Diagnosis not present

## 2019-01-28 DIAGNOSIS — M1990 Unspecified osteoarthritis, unspecified site: Secondary | ICD-10-CM | POA: Diagnosis not present

## 2019-01-28 DIAGNOSIS — Z7952 Long term (current) use of systemic steroids: Secondary | ICD-10-CM | POA: Diagnosis not present

## 2019-01-28 LAB — CBC WITH DIFFERENTIAL/PLATELET
Abs Immature Granulocytes: 0.06 10*3/uL (ref 0.00–0.07)
Basophils Absolute: 0.1 10*3/uL (ref 0.0–0.1)
Basophils Relative: 0 %
Eosinophils Absolute: 0.1 10*3/uL (ref 0.0–0.5)
Eosinophils Relative: 1 %
HCT: 39.9 % (ref 36.0–46.0)
Hemoglobin: 12.7 g/dL (ref 12.0–15.0)
Immature Granulocytes: 0 %
Lymphocytes Relative: 22 %
Lymphs Abs: 3 10*3/uL (ref 0.7–4.0)
MCH: 29.5 pg (ref 26.0–34.0)
MCHC: 31.8 g/dL (ref 30.0–36.0)
MCV: 92.6 fL (ref 80.0–100.0)
Monocytes Absolute: 1.1 10*3/uL — ABNORMAL HIGH (ref 0.1–1.0)
Monocytes Relative: 9 %
Neutro Abs: 9 10*3/uL — ABNORMAL HIGH (ref 1.7–7.7)
Neutrophils Relative %: 68 %
Platelets: 308 10*3/uL (ref 150–400)
RBC: 4.31 MIL/uL (ref 3.87–5.11)
RDW: 15 % (ref 11.5–15.5)
WBC: 13.3 10*3/uL — ABNORMAL HIGH (ref 4.0–10.5)
nRBC: 0 % (ref 0.0–0.2)

## 2019-01-28 LAB — COMPREHENSIVE METABOLIC PANEL
ALT: 24 U/L (ref 0–44)
AST: 21 U/L (ref 15–41)
Albumin: 4.2 g/dL (ref 3.5–5.0)
Alkaline Phosphatase: 61 U/L (ref 38–126)
Anion gap: 10 (ref 5–15)
BUN: 23 mg/dL — ABNORMAL HIGH (ref 6–20)
CO2: 25 mmol/L (ref 22–32)
Calcium: 9.3 mg/dL (ref 8.9–10.3)
Chloride: 103 mmol/L (ref 98–111)
Creatinine, Ser: 0.82 mg/dL (ref 0.44–1.00)
GFR calc Af Amer: >60 mL/min (ref >60)
GFR calc non Af Amer: >60 mL/min (ref >60)
Glucose, Bld: 103 mg/dL — ABNORMAL HIGH (ref 70–99)
Potassium: 4.3 mmol/L (ref 3.5–5.1)
Sodium: 138 mmol/L (ref 135–145)
Total Bilirubin: 0.7 mg/dL (ref 0.3–1.2)
Total Protein: 6.8 g/dL (ref 6.5–8.1)

## 2019-01-28 NOTE — Assessment & Plan Note (Addendum)
#  Pancreatic/biliary duct neuroendocrine-low-grade carcinoid; April 2020-PET scan no evidence of disease; again reviewed the imaging/scan today.  Stable.  #Clinically evidence of recurrence.  Await chromogranin A level today.  Again discussed the possible discrepant reason for elevated chromogranin A level/with normal PET scan.  With regards to next imaging-we will decide on chromogranin A levels from today.  # Elevated chromogranin A levels-can be nonspecific at times; affected by medications etc.  Monitor closely.  #History of smoking/COPD- STABLE. /continue lung cancer screening program.  Stable.  #Elevated white count/neutrophilia-likely from history of smoking.  No active infection noted.  #Elevated blood [id number] systolic.  Better at home as per patient.  Stable  #Also discussed the patient has been over the phone.  # DISPOSITION: We will call with results. # follow up in 6 months- MD/labs- cbc/cmp/chromogranin-A- Dr.B

## 2019-01-28 NOTE — Progress Notes (Signed)
[place] CONSULT NOTE  Patient Care Team: [name], MD as PCP - General (Family Medicine) Ellison Hughs Chrissie Noa, MD (Family Medicine)  CHIEF COMPLAINTS/PURPOSE OF CONSULTATION:  Carcinoid  #  Oncology History Overview Note  # s/p resection in 07/19/2012 in [place], [place].  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  Proximal and distal margins were negative.  Lymph nodes were negative.  MRCP on 04/23/2012 in [place], [place], revealed features of choledocholithiasis with a 1.2 cm ovoid filling defect within the mid segment of the CBD with associated intrahepatic and proximal extrahepatic biliary ductal dilatation. 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  female is here for follow-up for history of pancreas/bile duct carcinoma.  Patient denies any nausea vomiting abdominal pain.  Denies any skin rash or itching.  Continues to have abdominal discomfort right upper quadrant at the site of surgical incision.   Denies any leg swelling.    Review of Systems  Constitutional: Negative for chills, diaphoresis, fever, malaise/fatigue and weight loss.  HENT: Negative for nosebleeds and  sore throat.   Eyes: Negative for double vision.  Respiratory: Negative for cough, hemoptysis, sputum production, shortness of breath and wheezing.   Cardiovascular: Negative for chest pain, palpitations, orthopnea and leg swelling.  Gastrointestinal: Positive for diarrhea. Negative for abdominal pain (Site of abdominal incision), blood in stool, constipation, heartburn, melena, nausea and vomiting.  Genitourinary: Negative for dysuria, frequency and urgency.  Musculoskeletal: Negative for back pain and joint pain.  Skin: Negative.  Negative for itching and rash.  Neurological: Negative for dizziness, tingling, focal weakness, weakness and headaches.  Endo/Heme/Allergies: Does not bruise/bleed easily.  Psychiatric/Behavioral: Negative for depression. The patient is not nervous/anxious and does not have insomnia.      MEDICAL HISTORY:  Past Medical History:  Diagnosis Date  . Arthritis    hands  . Asthma   . Barrett's esophagus   . Cancer [facility])    neuroendocrine ca  . COPD (chronic obstructive pulmonary disease) ([place])   . Depression   . GERD (gastroesophageal reflux disease)   . Hypertension   . Motion sickness    reading in car  . Neuroendocrine carcinoma ([place]) 01/16/2017  . Pneumonia 2017  . Sleep apnea     SURGICAL HISTORY: Past Surgical History:  Procedure Laterality Date  . ABDOMINAL HYSTERECTOMY    . BOWEL RESECTION  07/19/2012  . CESAREAN SECTION     x3  . ESOPHAGOGASTRODUODENOSCOPY (EGD) WITH PROPOFOL N/A 03/05/2017   Procedure: ESOPHAGOGASTRODUODENOSCOPY (EGD) WITH PROPOFOL;  Surgeon: [name], MD;  Location: [facility];  Service: Endoscopy;  Laterality: N/A;  . ESOPHAGOGASTRODUODENOSCOPY (EGD) WITH PROPOFOL N/A 03/30/2017   REPEAT IN 03/2019  . HAND SURGERY Right   . TRANSFORAMINAL LUMBAR INTERBODY FUSION (TLIF) WITH PEDICLE SCREW FIXATION 1 LEVEL N/A 12/05/2017   Procedure: TRANSFORAMINAL LUMBAR INTERBODY FUSION (  TLIF) WITH PEDICLE SCREW FIXATION 1  LEVEL-L4-5;  Surgeon: Meade Maw, MD;  Location: [facility];  Service: Neurosurgery;  Laterality: N/A;    SOCIAL HISTORY: Social History   Socioeconomic History  . Marital status: Married    Spouse name: Not on file  . Number of children: Not on file  . Years of education: Not on file  . Highest education level: Not on file  Occupational History  . Not on file  Social Needs  . Financial resource strain: Not on file  . Food insecurity    Worry: Not on file    Inability: Not on file  . Transportation needs    Medical: Not on file    Non-medical: Not on file  Tobacco Use  . Smoking status: Former Smoker    Packs/day: 2.00    Years: 37.00    Pack years: 74.00    Types: Cigarettes    Quit date: 07/19/2012    Years since quitting: 6.5  . Smokeless tobacco: Never Used  Substance and Sexual Activity  . Alcohol use: Yes    Comment: 2 drinks/mo  . Drug use: No  . Sexual activity: Yes  Lifestyle  . Physical activity    Days per week: Not on file    Minutes per session: Not on file  . Stress: Not on file  Relationships  . Social Herbalist on phone: Not on file    Gets together: Not on file    Attends religious service: Not on file    Active member of club or organization: Not on file    Attends meetings of clubs or organizations: Not on file    Relationship status: Not on file  . Intimate partner violence    Fear of current or ex partner: Not on file    Emotionally abused: Not on file    Physically abused: Not on file    Forced sexual activity: Not on file  Other Topics Concern  . Not on file  Social History Narrative  . Not on file    FAMILY HISTORY: Family History  Problem Relation Age of Onset  . Breast cancer Cousin        mat cousin    ALLERGIES:  has No Known Allergies.  MEDICATIONS:  Current Outpatient Medications  Medication Sig Dispense Refill  . ADVAIR DISKUS 250-50 MCG/DOSE AEPB USE 1 INHALATION TWICE A DAY 180 each 4  .  albuterol (PROVENTIL HFA;VENTOLIN HFA) 108 (90 Base) MCG/ACT inhaler Inhale 1-2 puffs into the lungs every 4 (four) hours as needed for wheezing or shortness of breath.     Marland Kitchen buPROPion (WELLBUTRIN SR) 200 MG 12 hr tablet TAKE 1 TABLET TWICE A DAY 180 tablet 4  . Cholecalciferol (VITAMIN D3) 25 MCG (1000 UT) CAPS Take by mouth.    . gabapentin (NEURONTIN) 300 MG capsule Take 300 mg 3 (three) times daily by mouth.     Marland Kitchen HYDROcodone-acetaminophen (NORCO/VICODIN) 5-325 MG tablet Take by mouth.    . lamoTRIgine (LAMICTAL) 100 MG tablet Take 3 tablets (300 mg total) by mouth daily. 270 tablet 4  . lisdexamfetamine (VYVANSE) 70 MG capsule Take 1 capsule (70 mg total) by mouth daily. 90 capsule 0  . MAGNESIUM PO Take 500 mg by mouth every evening.     . milk thistle 175 MG tablet Take 175 mg by mouth every evening.     . montelukast (SINGULAIR) 10 MG tablet Take 10 mg by mouth at bedtime.     Marland Kitchen  Multiple Vitamins-Minerals (CENTRUM SILVER PO) Take 1 tablet by mouth daily.    Marland Kitchen omeprazole (PRILOSEC) 40 MG capsule Take 40 mg by mouth daily.     Marland Kitchen oxyCODONE (OXY IR/ROXICODONE) 5 MG immediate release tablet Take 1 tablet (5 mg total) by mouth every 3 (three) hours as needed for moderate pain ((score 4 to 6)). 30 tablet 0  . predniSONE (DELTASONE) 10 MG tablet TK 5 TS PO QD FOR 2 DAYS THEN DECREASE BY 1 T Q 2 DAYS UNTIL GONE    . SPIRIVA RESPIMAT 2.5 MCG/ACT AERS Inhale 2 puffs into the lungs every morning.     Marland Kitchen tiZANidine (ZANAFLEX) 4 MG tablet     . valACYclovir (VALTREX) 1000 MG tablet     . valsartan-hydrochlorothiazide (DIOVAN-HCT) 160-12.5 MG tablet     . ipratropium-albuterol (DUONEB) 0.5-2.5 (3) MG/3ML SOLN Inhale 3 mLs into the lungs every 4 (four) hours as needed (Only uses with cold symptoms).      No current facility-administered medications for this visit.       Marland Kitchen  PHYSICAL EXAMINATION: ECOG PERFORMANCE STATUS: 0 - Asymptomatic  Vitals:   01/28/19 1041  BP: (!) 162/70  Pulse: 81   Resp: 20  Temp: 98.8 F (37.1 C)   There were no vitals filed for this visit.  Physical Exam  Constitutional: She is oriented to person, place, and time and well-developed, well-nourished, and in no distress.  HENT:  Head: Normocephalic and atraumatic.  Mouth/Throat: Oropharynx is clear and moist. No oropharyngeal exudate.  Eyes: Pupils are equal, round, and reactive to light.  Neck: Normal range of motion. Neck supple.  Cardiovascular: Normal rate and regular rhythm.  Pulmonary/Chest: Effort normal and breath sounds normal. No respiratory distress. She has no wheezes.  Abdominal: Soft. Bowel sounds are normal. She exhibits no distension and no mass. There is no abdominal tenderness. There is no rebound and no guarding.  Abdominal incision noted right upper quadrant/surgery.  Well-healed.  Musculoskeletal: Normal range of motion.        General: No tenderness or edema.  Neurological: She is alert and oriented to person, place, and time.  Skin: Skin is warm.  Psychiatric: Affect normal.     LABORATORY DATA:  I have reviewed the data as listed Lab Results  Component Value Date   WBC 13.3 (H) 01/28/2019   HGB 12.7 01/28/2019   HCT 39.9 01/28/2019   MCV 92.6 01/28/2019   PLT 308 01/28/2019   Recent Labs    07/30/18 0916 01/28/19 0958  NA 137 138  K 3.7 4.3  CL 101 103  CO2 28 25  GLUCOSE 107* 103*  BUN 19 23*  CREATININE 0.75 0.82  CALCIUM 9.2 9.3  GFRNONAA >60 >60  GFRAA >60 >60  PROT 7.3 6.8  ALBUMIN 4.3 4.2  AST 28 21  ALT 32 24  ALKPHOS 70 61  BILITOT 0.6 0.7    RADIOGRAPHIC STUDIES: I have personally reviewed the radiological images as listed and agreed with the findings in the report. Mr Cervical Spine Wo Contrast  Result Date: 01/25/2019 CLINICAL DATA:  Neck pain, chronic EXAM: MRI CERVICAL SPINE WITHOUT CONTRAST TECHNIQUE: Multiplanar, multisequence MR imaging of the cervical spine was performed. No intravenous contrast was administered. COMPARISON:   None. FINDINGS: Alignment: There is minimal retrolisthesis of C2 on C3 and C3 on C4. There is slight reversal of the normal cervical lordosis. Vertebrae: The vertebral body heights are well maintained. No fracture, marrow edema,or pathologic marrow infiltration. Endplate Schmorl's nodes  are seen most notable at C4. Small anterior osteophytes are seen. Cord: Normal signal and morphology. Posterior Fossa, vertebral arteries, paraspinal tissues: The visualized portion of the posterior fossa is unremarkable. Normal flow voids seen within the vertebral arteries. The paraspinal soft tissues are unremarkable. Disc levels: C1-C2: Atlanto-axial junction is normal, without canal narrowing C2-C3: No significant spinal canal or neural foraminal narrowing C3-C4: There is a minimal disc osteophyte complex and uncovertebral osteophytes which causes severe right and moderate to severe left neural foraminal narrowing. The central thecal sac is effaced measuring 5 mm in AP diameter. C4-C5: There is a disc osteophyte complex which is asymmetric to the right and uncovertebral osteophytes. Severe bilateral neural foraminal narrowing is seen. The central thecal sac is effaced measuring 5 mm in AP diameter. C5-C6: There is a disc osteophyte complex and uncovertebral osteophytes which causes severe bilateral neural foraminal narrowing, left greater than right. The central thecal sac measures 6 mm in AP diameter. C6-C7: There is a disc osteophyte complex and uncovertebral osteophytes which causes severe left and moderate to severe right neural foraminal narrowing. There is mild effacement anterior thecal sac. C7-T1: Disc osteophyte complex and uncovertebral osteophytes are seen which causes severe bilateral neural foraminal narrowing. IMPRESSION: Minimal retrolisthesis of on C3 on C4 and C4 on C5. Cervical spine spondylosis most notable at C4-C5 with severe bilateral neural foraminal narrowing and moderate central canal stenosis as well as  at C5-C6 with severe bilateral neural foraminal narrowing, left greater than right, and moderate central canal stenosis. Electronically Signed   By: Prudencio Pair M.D.   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  Stable.  #Clinically evidence of recurrence.  Await chromogranin A level today.  Again discussed the possible discrepant reason for elevated chromogranin A level/with normal PET scan.  With regards to next imaging-we will decide on chromogranin A levels from today.  # Elevated chromogranin A levels-can be nonspecific at times; affected by medications etc.  Monitor closely.  #History of smoking/COPD- STABLE. /continue lung cancer screening program.  Stable.  #Elevated white count/neutrophilia-likely from history of smoking.  No active infection noted.  #Elevated blood [id number] systolic.  Better at home as per patient.  Stable  #Also discussed the patient has been over the phone.  # DISPOSITION: We will call with results. # follow up in 6 months- MD/labs- cbc/cmp/chromogranin-A- Dr.B  All questions were answered. The patient knows to call the clinic with any problems, questions or concerns.  # 25 minutes face-to-face with the patient discussing the above plan of care; more than 50% of time spent on prognosis/ natural history; counseling and coordination.     Cammie Sickle, MD 01/28/2019 11:29 AM

## 2019-01-29 LAB — CHROMOGRANIN A: Chromogranin A (ng/mL): 825.9 ng/mL — ABNORMAL HIGH (ref 0.0–101.8)

## 2019-02-04 ENCOUNTER — Telehealth: Payer: Self-pay | Admitting: Internal Medicine

## 2019-02-04 DIAGNOSIS — C7A8 Other malignant neuroendocrine tumors: Secondary | ICD-10-CM

## 2019-02-04 NOTE — Telephone Encounter (Signed)
Spoke to patient regarding the results of the elevated chromogranin levels; suspicious for biochemical recurrence.  Recent imaging March 2020 PET scan negative.  Patient in agreement of the plan.  Recommend repeating chromogranin A levels/LFTs-in 3 months; please order.   Follow-up with me/labs in 6 months as planned.

## 2019-02-05 NOTE — Addendum Note (Signed)
Addended by: Sabino Gasser on: 02/05/2019 08:36 AM   Modules accepted: Orders

## 2019-02-25 ENCOUNTER — Other Ambulatory Visit: Payer: Self-pay

## 2019-02-25 ENCOUNTER — Emergency Department

## 2019-02-25 ENCOUNTER — Emergency Department
Admission: EM | Admit: 2019-02-25 | Discharge: 2019-02-25 | Disposition: A | Discharge status: Home / Self Care | Attending: Student in an Organized Health Care Education/Training Program | Admitting: Student in an Organized Health Care Education/Training Program

## 2019-02-25 ENCOUNTER — Encounter: Payer: Self-pay | Admitting: *Deleted

## 2019-02-25 DIAGNOSIS — C7A8 Other malignant neuroendocrine tumors: Secondary | ICD-10-CM | POA: Insufficient documentation

## 2019-02-25 DIAGNOSIS — Z20828 Contact with and (suspected) exposure to other viral communicable diseases: Secondary | ICD-10-CM | POA: Insufficient documentation

## 2019-02-25 DIAGNOSIS — Z7901 Long term (current) use of anticoagulants: Secondary | ICD-10-CM | POA: Diagnosis not present

## 2019-02-25 DIAGNOSIS — I10 Essential (primary) hypertension: Secondary | ICD-10-CM | POA: Diagnosis not present

## 2019-02-25 DIAGNOSIS — J189 Pneumonia, unspecified organism: Secondary | ICD-10-CM | POA: Diagnosis not present

## 2019-02-25 DIAGNOSIS — Z79899 Other long term (current) drug therapy: Secondary | ICD-10-CM | POA: Insufficient documentation

## 2019-02-25 DIAGNOSIS — Z87891 Personal history of nicotine dependence: Secondary | ICD-10-CM | POA: Insufficient documentation

## 2019-02-25 DIAGNOSIS — R079 Chest pain, unspecified: Secondary | ICD-10-CM

## 2019-02-25 DIAGNOSIS — Z8507 Personal history of malignant neoplasm of pancreas: Secondary | ICD-10-CM | POA: Diagnosis not present

## 2019-02-25 DIAGNOSIS — J45909 Unspecified asthma, uncomplicated: Secondary | ICD-10-CM | POA: Diagnosis not present

## 2019-02-25 DIAGNOSIS — R0602 Shortness of breath: Secondary | ICD-10-CM | POA: Diagnosis present

## 2019-02-25 LAB — CBC
HCT: 36.9 % (ref 36.0–46.0)
Hemoglobin: 11.9 g/dL — ABNORMAL LOW (ref 12.0–15.0)
MCH: 29.2 pg (ref 26.0–34.0)
MCHC: 32.2 g/dL (ref 30.0–36.0)
MCV: 90.4 fL (ref 80.0–100.0)
Platelets: 271 10*3/uL (ref 150–400)
RBC: 4.08 MIL/uL (ref 3.87–5.11)
RDW: 15.1 % (ref 11.5–15.5)
WBC: 14 10*3/uL — ABNORMAL HIGH (ref 4.0–10.5)
nRBC: 0 % (ref 0.0–0.2)

## 2019-02-25 LAB — BASIC METABOLIC PANEL
Anion gap: 10 (ref 5–15)
BUN: 16 mg/dL (ref 6–20)
CO2: 24 mmol/L (ref 22–32)
Calcium: 9.1 mg/dL (ref 8.9–10.3)
Chloride: 101 mmol/L (ref 98–111)
Creatinine, Ser: 0.75 mg/dL (ref 0.44–1.00)
GFR calc Af Amer: >60 mL/min (ref >60)
GFR calc non Af Amer: >60 mL/min (ref >60)
Glucose, Bld: 101 mg/dL — ABNORMAL HIGH (ref 70–99)
Potassium: 3.5 mmol/L (ref 3.5–5.1)
Sodium: 135 mmol/L (ref 135–145)

## 2019-02-25 LAB — SARS CORONAVIRUS 2 BY RT PCR (HOSPITAL ORDER, PERFORMED IN ~~LOC~~ HOSPITAL LAB): SARS Coronavirus 2: NEGATIVE

## 2019-02-25 LAB — TROPONIN I (HIGH SENSITIVITY)
Troponin I (High Sensitivity): 5 ng/L (ref <18)
Troponin I (High Sensitivity): 5 ng/L (ref <18)

## 2019-02-25 MED ORDER — LEVOFLOXACIN 750 MG PO TABS: 750.0000 mg | ORAL_TABLET | Freq: Every day | ORAL | 0 refills | Status: AC

## 2019-02-25 MED ORDER — IPRATROPIUM-ALBUTEROL 0.5-2.5 (3) MG/3ML IN SOLN
3.0000 mL | Freq: Once | RESPIRATORY_TRACT | Status: AC
  Administered 2019-02-25: 3 mL via RESPIRATORY_TRACT
  Filled 2019-02-25: qty 3

## 2019-02-25 MED ORDER — IOHEXOL 350 MG/ML SOLN
75.0000 mL | Freq: Once | INTRAVENOUS | Status: AC | PRN
  Administered 2019-02-25: 75 mL via INTRAVENOUS
  Filled 2019-02-25: qty 75

## 2019-02-25 MED ORDER — LEVOFLOXACIN 750 MG PO TABS
750.0000 mg | ORAL_TABLET | Freq: Once | ORAL | Status: AC
  Administered 2019-02-25: 750 mg via ORAL
  Filled 2019-02-25: qty 1

## 2019-02-25 NOTE — ED Provider Notes (Signed)
[facility] Emergency Department Provider Note    First MD Initiated Contact with Patient 02/25/19 1855     (approximate)  I have reviewed the triage vital signs and the nursing notes.   HISTORY  Chief Complaint Shortness of Breath    HPI Bailey Flores is a 58 y.o. female close past medical history presents the ER for evaluation of chest pain wrapping around her chest going through to her back intermittent in nature with some shortness of breath and productive cough for the past 24 hours.  Felt she was having chills but no measured fevers.  Has had similar symptoms in the past when she was diagnosed with pneumonia.  Does have a history of COPD.  Denies any nausea or vomiting.    Past Medical History:  Diagnosis Date  . Arthritis    hands  . Asthma   . Barrett's esophagus   . Cancer [facility])    neuroendocrine ca  . COPD (chronic obstructive pulmonary disease) ([place])   . Depression   . GERD (gastroesophageal reflux disease)   . Hypertension   . Motion sickness    reading in car  . 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 Pneumonia 2017  . Sleep apnea    Family History  Problem Relation Age of Onset  . Breast cancer Cousin        mat cousin   Past Surgical History:  Procedure Laterality Date  . ABDOMINAL HYSTERECTOMY    . BOWEL RESECTION  07/19/2012  . CESAREAN SECTION     x3  . ESOPHAGOGASTRODUODENOSCOPY (EGD) WITH PROPOFOL N/A 03/05/2017   Procedure: ESOPHAGOGASTRODUODENOSCOPY (EGD) WITH PROPOFOL;  Surgeon: [name], MD;  Location: [place];  Service: Endoscopy;  Laterality: N/A;  . ESOPHAGOGASTRODUODENOSCOPY (EGD) WITH PROPOFOL N/A 03/30/2017   REPEAT IN 03/2019  . HAND SURGERY Right   . TRANSFORAMINAL LUMBAR INTERBODY FUSION (TLIF) WITH PEDICLE SCREW FIXATION 1 LEVEL N/A 12/05/2017   Procedure: TRANSFORAMINAL LUMBAR INTERBODY FUSION (TLIF) WITH PEDICLE SCREW FIXATION 1 LEVEL-L4-5;  Surgeon: Meade Maw, MD;   Location: [facility] ORS;  Service: Neurosurgery;  Laterality: N/A;   Patient Active Problem List   Diagnosis Date Noted  . Bipolar disorder, current episode depressed, mild ([place]) 01/03/2019  . Attention deficit hyperactivity disorder (ADHD), predominantly inattentive type 01/03/2019  . 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 Sepsis ([place]) 12/10/2017  . Lumbar radiculopathy 12/05/2017  . Goals of care, counseling/discussion 07/17/2017  . Arthritis of right glenohumeral joint 06/13/2017  . Mild cognitive impairment 04/26/2017  . Intestinal metaplasia of gastric mucosa   . Gastritis without bleeding   . Barrett's esophagus without dysplasia   . Heartburn   . Loss of memory 01/18/2017  . Tremor 01/18/2017  . Attention deficit hyperactivity disorder (ADHD), combined type 01/16/2017  . Bipolar 1 disorder ([place]) 01/16/2017  . Chronic abdominal pain 01/16/2017  . COPD, moderate ([place]) 01/16/2017  . Essential hypertension 01/16/2017  . Gastroesophageal reflux disease without esophagitis 01/16/2017  . Herpes simplex 01/16/2017  . Memory problem 01/16/2017      Prior to Admission medications   Medication Sig Start Date End Date Taking? Authorizing Provider  ADVAIR DISKUS 250-50 MCG/DOSE AEPB USE 1 INHALATION TWICE A DAY 07/02/18   [name], MD  albuterol (PROVENTIL HFA;VENTOLIN HFA) 108 (90 Base) MCG/ACT inhaler Inhale 1-2 puffs into the lungs every 4 (four) hours as needed for wheezing or shortness of breath.  [provider]  buPROPion (WELLBUTRIN SR) 200 MG 12 hr tablet TAKE 1 TABLET TWICE A DAY 04/03/18   [name], MD  Cholecalciferol (VITAMIN D3) 25 MCG (1000 UT) CAPS Take by mouth.    [provider]  gabapentin (NEURONTIN) 300 MG capsule Take 300 mg 3 (three) times daily by mouth.  02/15/17   [provider]  HYDROcodone-acetaminophen (NORCO/VICODIN) 5-325 MG  tablet Take by mouth. 12/18/18   [provider]  ipratropium-albuterol (DUONEB) 0.5-2.5 (3) MG/3ML SOLN Inhale 3 mLs into the lungs every 4 (four) hours as needed (Only uses with cold symptoms).  01/16/17 01/24/18  [provider]  lamoTRIgine (LAMICTAL) 100 MG tablet Take 3 tablets (300 mg total) by mouth daily. 04/04/18   [name], MD  levofloxacin (LEVAQUIN) 750 MG tablet Take 1 tablet (750 mg total) by mouth daily for 6 days. 02/25/19 03/03/19  Merlyn Lot, MD  lisdexamfetamine (VYVANSE) 70 MG capsule Take 1 capsule (70 mg total) by mouth daily. 01/03/19   [name], MD  MAGNESIUM PO Take 500 mg by mouth every evening.     [provider]  milk thistle 175 MG tablet Take 175 mg by mouth every evening.     [provider]  montelukast (SINGULAIR) 10 MG tablet Take 10 mg by mouth at bedtime.  01/16/17   [provider]  Multiple Vitamins-Minerals (CENTRUM SILVER PO) Take 1 tablet by mouth daily.    [provider]  omeprazole (PRILOSEC) 40 MG capsule Take 40 mg by mouth daily.  01/16/17   [provider]  oxyCODONE (OXY IR/ROXICODONE) 5 MG immediate release tablet Take 1 tablet (5 mg total) by mouth every 3 (three) hours as needed for moderate pain ((score 4 to 6)). 12/07/17   [name], PA-C  predniSONE (DELTASONE) 10 MG tablet TK 5 TS PO QD FOR 2 DAYS THEN DECREASE BY 1 T Q 2 DAYS UNTIL GONE 10/01/18   [provider]  SPIRIVA RESPIMAT 2.5 MCG/ACT AERS Inhale 2 puffs into the lungs every morning.  06/07/17   [provider]  tiZANidine (ZANAFLEX) 4 MG tablet  10/01/18   [provider]  valACYclovir (VALTREX) 1000 MG tablet  11/01/18   [provider]  valsartan-hydrochlorothiazide (DIOVAN-HCT) 160-12.5 MG tablet  08/05/18   [provider]    Allergies Patient has no known allergies.    Social History Social History   Tobacco Use  . Smoking status: Former Smoker     Packs/day: 2.00    Years: 37.00    Pack years: 74.00    Types: Cigarettes    Quit date: 07/19/2012    Years since quitting: 6.6  . Smokeless tobacco: Never Used  Substance Use Topics  . Alcohol use: Yes    Comment: 2 drinks/mo  . Drug use: No    Review of Systems Patient denies headaches, rhinorrhea, blurry vision, numbness, shortness of breath, chest pain, edema, cough, abdominal pain, nausea, vomiting, diarrhea, dysuria, fevers, rashes or hallucinations unless otherwise stated above in HPI. ____________________________________________   PHYSICAL EXAM:  VITAL SIGNS: Vitals:   02/25/19 1642  BP: 110/65  Pulse: 95  Resp: 16  Temp: 98.1 F (36.7 C)  SpO2: 96%    Constitutional: Alert and oriented.  Eyes: Conjunctivae are normal.  Head: Atraumatic. Nose: No congestion/rhinnorhea. Mouth/Throat: Mucous membranes are moist.   Neck: No stridor. Painless ROM.  Cardiovascular: Normal rate, regular rhythm. Grossly normal heart sounds.  Good peripheral circulation. Respiratory: Normal respiratory effort.  No retractions. Lungs with coarse breath sounds throughout Gastrointestinal: Soft and nontender. No distention. No abdominal bruits. No CVA tenderness. Genitourinary:  Musculoskeletal: No lower extremity tenderness nor edema.  No joint effusions. Neurologic:  Normal speech and language. No gross focal neurologic deficits are appreciated. No facial droop Skin:  Skin is warm, dry and intact. No rash noted. Psychiatric: Mood and affect are normal. Speech and behavior are normal.  ____________________________________________   LABS (all labs ordered are listed, but only abnormal results are displayed)  Results for orders placed or performed during the hospital encounter of 02/25/19 (from the past 24 hour(s))  Basic metabolic panel     Status: Abnormal   Collection Time: 02/25/19  4:46 PM  Result Value Ref Range   Sodium 135 135 - 145 mmol/L   Potassium 3.5 3.5 - 5.1 mmol/L    Chloride 101 98 - 111 mmol/L   CO2 24 22 - 32 mmol/L   Glucose, Bld 101 (H) 70 - 99 mg/dL   BUN 16 6 - 20 mg/dL   Creatinine, Ser 0.75 0.44 - 1.00 mg/dL   Calcium 9.1 8.9 - 10.3 mg/dL   GFR calc non Af Amer >60 >60 mL/min   GFR calc Af Amer >60 >60 mL/min   Anion gap 10 5 - 15  CBC     Status: Abnormal   Collection Time: 02/25/19  4:46 PM  Result Value Ref Range   WBC 14.0 (H) 4.0 - 10.5 K/uL   RBC 4.08 3.87 - 5.11 MIL/uL   Hemoglobin 11.9 (L) 12.0 - 15.0 g/dL   HCT 36.9 36.0 - 46.0 %   MCV 90.4 80.0 - 100.0 fL   MCH 29.2 26.0 - 34.0 pg   MCHC 32.2 30.0 - 36.0 g/dL   RDW 15.1 11.5 - 15.5 %   Platelets 271 150 - 400 K/uL   nRBC 0.0 0.0 - 0.2 %  Troponin I (High Sensitivity)     Status: None   Collection Time: 02/25/19  4:46 PM  Result Value Ref Range   Troponin I (High Sensitivity) 5 <18 ng/L  Troponin I (High Sensitivity)     Status: None   Collection Time: 02/25/19  7:14 PM  Result Value Ref Range   Troponin I (High Sensitivity) 5 <18 ng/L  SARS Coronavirus 2 [facility] order, Performed in [place] hospital lab) Nasopharyngeal Nasopharyngeal Swab     Status: None   Collection Time: 02/25/19  7:14 PM   Specimen: Nasopharyngeal Swab  Result Value Ref Range   SARS Coronavirus 2 NEGATIVE NEGATIVE   ____________________________________________  EKG My review and personal interpretation at Time:   17:03 Indication: chest pain  Rate: 90  Rhythm: sinus Axis: normal Other: normal intervals, no stemi ____________________________________________  RADIOLOGY  I personally reviewed all radiographic images ordered to evaluate for the above acute complaints and reviewed radiology reports and findings.  These findings were personally discussed with the patient.  Please see medical record for radiology report.  ____________________________________________   PROCEDURES  Procedure(s) performed:  Procedures    Critical Care performed: no  ____________________________________________   INITIAL IMPRESSION / ASSESSMENT AND PLAN / ED COURSE  Pertinent labs & imaging results that were available during my care of the patient were reviewed by me and considered in my medical decision making (see chart for details).   DDX: ACS, pericarditis, esophagitis, boerhaaves, pe, dissection, pna, bronchitis, costochondritis   Fonnie Casado is a 58 y.o. who presents to the ED with symptoms as described  above.  Patient states his symptoms are consistent with previous episodes of pneumonia but she does not have any fever he does have mild leukocytosis.  Chest x-ray without any clear infiltrate.  Does have neuroendocrine cancer and given her presentation will evaluate for PE as she is on anticoagulation.  Have a lower suspicion for ACS.  Troponin is negative and her EKG is nonischemic after 24 hours of sx.    Clinical Course as of Feb 24 2030  Tue Feb 25, 2019  2022 BUN: 16 [PR]  2026 CT imaging does show evidence of probable community-acquired pneumonia.  She is not hypoxic.  Does meet criteria for trial of outpatient follow-up and antibiotics   [PR]    Clinical Course User Index [PR] Merlyn Lot, MD    The patient was evaluated in Emergency Department today for the symptoms described in the history of present illness. He/she was evaluated in the context of the global COVID-19 pandemic, which necessitated consideration that the patient might be at risk for infection with the SARS-CoV-2 virus that causes COVID-19. Institutional protocols and algorithms that pertain to the evaluation of patients at risk for COVID-19 are in a state of rapid change based on information released by regulatory bodies including the CDC and federal and state organizations. These policies and algorithms were followed during the patient's care in the ED.  As part of my medical decision making, I reviewed the following data within the electronic medical  record:  Nursing notes reviewed and incorporated, Labs reviewed, notes from prior ED visits and [place] Controlled Substance Database   ____________________________________________   FINAL CLINICAL IMPRESSION(S) / ED DIAGNOSES  Final diagnoses:  Community acquired pneumonia, unspecified laterality  Chest pain, unspecified type      NEW MEDICATIONS STARTED DURING THIS VISIT:  New Prescriptions   LEVOFLOXACIN (LEVAQUIN) 750 MG TABLET    Take 1 tablet (750 mg total) by mouth daily for 6 days.     Note:  This document was prepared using Dragon voice recognition software and may include unintentional dictation errors.    Merlyn Lot, MD 02/25/19 2031

## 2019-02-25 NOTE — ED Triage Notes (Signed)
PT to ED reporting increased SOB with one episode of chest pain last night that wrapped around to her back. Pt reporting she took prescribed Vicodin for the pain, which has helped.

## 2019-02-25 NOTE — ED Notes (Signed)
IV placed in LT AC. Repeat troponin and COVID swab obtained.

## 2019-02-26 ENCOUNTER — Telehealth: Payer: Self-pay | Admitting: Gastroenterology

## 2019-02-26 NOTE — Telephone Encounter (Signed)
Patient called & stated she received letter to schedule a repeat EGD with Dr Allen Norris.

## 2019-02-27 ENCOUNTER — Encounter: Payer: Self-pay | Admitting: *Deleted

## 2019-02-27 ENCOUNTER — Other Ambulatory Visit: Payer: Self-pay

## 2019-02-27 DIAGNOSIS — K3189 Other diseases of stomach and duodenum: Secondary | ICD-10-CM

## 2019-02-27 DIAGNOSIS — K31A0 Gastric intestinal metaplasia, unspecified: Secondary | ICD-10-CM

## 2019-02-27 NOTE — Telephone Encounter (Signed)
Contacted pt and scheduled her for a repeat EGD on Monday, Oct 12th. Instructions sent via MyChart.

## 2019-03-07 DIAGNOSIS — J189 Pneumonia, unspecified organism: Secondary | ICD-10-CM | POA: Insufficient documentation

## 2019-03-13 ENCOUNTER — Other Ambulatory Visit
Admission: RE | Admit: 2019-03-13 | Discharge: 2019-03-13 | Disposition: A | Discharge status: Home / Self Care | Source: Ambulatory Visit | Attending: Gastroenterology | Admitting: Gastroenterology

## 2019-03-13 ENCOUNTER — Other Ambulatory Visit: Payer: Self-pay

## 2019-03-13 DIAGNOSIS — Z20828 Contact with and (suspected) exposure to other viral communicable diseases: Secondary | ICD-10-CM | POA: Insufficient documentation

## 2019-03-13 DIAGNOSIS — Z01812 Encounter for preprocedural laboratory examination: Secondary | ICD-10-CM | POA: Insufficient documentation

## 2019-03-13 LAB — SARS CORONAVIRUS 2 (TAT 6-24 HRS): SARS Coronavirus 2: NEGATIVE

## 2019-03-14 NOTE — Discharge Instructions (Signed)

## 2019-03-17 ENCOUNTER — Ambulatory Visit
Admission: RE | Admit: 2019-03-17 | Discharge: 2019-03-17 | Disposition: A | Discharge status: Home / Self Care | Attending: Gastroenterology | Admitting: Gastroenterology

## 2019-03-17 ENCOUNTER — Other Ambulatory Visit: Payer: Self-pay

## 2019-03-17 ENCOUNTER — Ambulatory Visit: Admitting: Anesthesiology

## 2019-03-17 ENCOUNTER — Ambulatory Visit
Admission: RE | Disposition: A | Discharge status: Home / Self Care | Payer: Self-pay | Source: Home / Self Care | Attending: Gastroenterology

## 2019-03-17 DIAGNOSIS — J449 Chronic obstructive pulmonary disease, unspecified: Secondary | ICD-10-CM | POA: Insufficient documentation

## 2019-03-17 DIAGNOSIS — K2950 Unspecified chronic gastritis without bleeding: Secondary | ICD-10-CM | POA: Diagnosis not present

## 2019-03-17 DIAGNOSIS — K3189 Other diseases of stomach and duodenum: Secondary | ICD-10-CM | POA: Diagnosis not present

## 2019-03-17 DIAGNOSIS — G4730 Sleep apnea, unspecified: Secondary | ICD-10-CM | POA: Diagnosis not present

## 2019-03-17 DIAGNOSIS — K31A0 Gastric intestinal metaplasia, unspecified: Secondary | ICD-10-CM

## 2019-03-17 DIAGNOSIS — K219 Gastro-esophageal reflux disease without esophagitis: Secondary | ICD-10-CM | POA: Insufficient documentation

## 2019-03-17 DIAGNOSIS — Z7951 Long term (current) use of inhaled steroids: Secondary | ICD-10-CM | POA: Insufficient documentation

## 2019-03-17 DIAGNOSIS — F329 Major depressive disorder, single episode, unspecified: Secondary | ICD-10-CM | POA: Insufficient documentation

## 2019-03-17 DIAGNOSIS — K6389 Other specified diseases of intestine: Secondary | ICD-10-CM | POA: Diagnosis present

## 2019-03-17 DIAGNOSIS — Z87891 Personal history of nicotine dependence: Secondary | ICD-10-CM | POA: Insufficient documentation

## 2019-03-17 DIAGNOSIS — K2970 Gastritis, unspecified, without bleeding: Secondary | ICD-10-CM | POA: Diagnosis not present

## 2019-03-17 DIAGNOSIS — J45909 Unspecified asthma, uncomplicated: Secondary | ICD-10-CM | POA: Diagnosis not present

## 2019-03-17 DIAGNOSIS — I10 Essential (primary) hypertension: Secondary | ICD-10-CM | POA: Diagnosis not present

## 2019-03-17 HISTORY — DX: Reserved for inherently not codable concepts without codable children: IMO0001

## 2019-03-17 HISTORY — PX: BIOPSY: SHX5522

## 2019-03-17 HISTORY — DX: Encounter for fitting and adjustment of orthodontic device: Z46.4

## 2019-03-17 HISTORY — PX: ESOPHAGOGASTRODUODENOSCOPY (EGD) WITH PROPOFOL: SHX5813

## 2019-03-17 SURGERY — ESOPHAGOGASTRODUODENOSCOPY (EGD) WITH PROPOFOL
Anesthesia: General | Site: Esophagus

## 2019-03-17 MED ORDER — STERILE WATER FOR IRRIGATION IR SOLN
Status: DC | PRN
  Administered 2019-03-17: 50 mL

## 2019-03-17 MED ORDER — LIDOCAINE HCL (CARDIAC) PF 100 MG/5ML IV SOSY
PREFILLED_SYRINGE | INTRAVENOUS | Status: DC | PRN
  Administered 2019-03-17: 30 mg via INTRAVENOUS

## 2019-03-17 MED ORDER — GLYCOPYRROLATE 0.2 MG/ML IJ SOLN
INTRAMUSCULAR | Status: DC | PRN
  Administered 2019-03-17: 0.1 mg via INTRAVENOUS

## 2019-03-17 MED ORDER — PROPOFOL 10 MG/ML IV BOLUS
INTRAVENOUS | Status: DC | PRN
  Administered 2019-03-17: 100 mg via INTRAVENOUS
  Administered 2019-03-17 (×5): 20 mg via INTRAVENOUS

## 2019-03-17 MED ORDER — LACTATED RINGERS IV SOLN
INTRAVENOUS | Status: DC
  Administered 2019-03-17: 08:00:00 via INTRAVENOUS

## 2019-03-17 MED ORDER — ACETAMINOPHEN 325 MG PO TABS: 325.0000 mg | ORAL_TABLET | Freq: Once | ORAL | Status: DC

## 2019-03-17 MED ORDER — ACETAMINOPHEN 160 MG/5ML PO SOLN: 325.0000 mg | Freq: Once | ORAL | Status: DC

## 2019-03-17 SURGICAL SUPPLY — 33 items
BALLN DILATOR 10-12 8 (BALLOONS)
BALLN DILATOR 12-15 8 (BALLOONS)
BALLN DILATOR 15-18 8 (BALLOONS)
BALLN DILATOR CRE 0-12 8 (BALLOONS)
BALLN DILATOR ESOPH 8 10 CRE (MISCELLANEOUS) IMPLANT
BALLOON DILATOR 12-15 8 (BALLOONS) IMPLANT
BALLOON DILATOR 15-18 8 (BALLOONS) IMPLANT
BALLOON DILATOR CRE 0-12 8 (BALLOONS) IMPLANT
BLOCK BITE 60FR ADLT L/F GRN (MISCELLANEOUS) ×4 IMPLANT
CANISTER SUCT 1200ML W/VALVE (MISCELLANEOUS) ×4 IMPLANT
CLIP HMST 235XBRD CATH ROT (MISCELLANEOUS) IMPLANT
CLIP RESOLUTION 360 11X235 (MISCELLANEOUS)
ELECT REM PT RETURN 9FT ADLT (ELECTROSURGICAL)
ELECTRODE REM PT RTRN 9FT ADLT (ELECTROSURGICAL) IMPLANT
FCP ESCP3.2XJMB 240X2.8X (MISCELLANEOUS) ×2
FORCEPS BIOP RAD 4 LRG CAP 4 (CUTTING FORCEPS) IMPLANT
FORCEPS BIOP RJ4 240 W/NDL (MISCELLANEOUS) ×2
FORCEPS ESCP3.2XJMB 240X2.8X (MISCELLANEOUS) ×2 IMPLANT
GOWN CVR UNV OPN BCK APRN NK (MISCELLANEOUS) ×4 IMPLANT
GOWN ISOL THUMB LOOP REG UNIV (MISCELLANEOUS) ×4
INJECTOR VARIJECT VIN23 (MISCELLANEOUS) IMPLANT
KIT DEFENDO VALVE AND CONN (KITS) IMPLANT
KIT ENDO PROCEDURE OLY (KITS) ×4 IMPLANT
MARKER SPOT ENDO TATTOO 5ML (MISCELLANEOUS) IMPLANT
RETRIEVER NET PLAT FOOD (MISCELLANEOUS) IMPLANT
SNARE SHORT THROW 13M SML OVAL (MISCELLANEOUS) IMPLANT
SNARE SHORT THROW 30M LRG OVAL (MISCELLANEOUS) IMPLANT
SPOT EX ENDOSCOPIC TATTOO (MISCELLANEOUS)
SYR INFLATION 60ML (SYRINGE) IMPLANT
TRAP ETRAP POLY (MISCELLANEOUS) IMPLANT
VARIJECT INJECTOR VIN23 (MISCELLANEOUS)
WATER STERILE IRR 250ML POUR (IV SOLUTION) ×4 IMPLANT
WIRE CRE 18-20MM 8CM F G (MISCELLANEOUS) IMPLANT

## 2019-03-17 NOTE — Anesthesia Procedure Notes (Signed)
Date/Time: 03/17/2019 9:06 AM Performed by: Cameron Ali, CRNA Pre-anesthesia Checklist: Patient identified, Emergency Drugs available, Suction available, Timeout performed and Patient being monitored Patient Re-evaluated:Patient Re-evaluated prior to induction Oxygen Delivery Method: Nasal cannula Placement Confirmation: positive ETCO2

## 2019-03-17 NOTE — H&P (Signed)
Bailey Lame, MD [address] [name], [name] [address] [phone] Fax : [phone]  Primary Care Physician:  [name], MD Primary Gastroenterologist:  Dr. Allen Norris  Pre-Procedure History & Physical: HPI:  Bailey Flores is a 58 y.o. female is here for an endoscopy.   Past Medical History:  Diagnosis Date  . Arthritis    hands  . Asthma   . Barrett's esophagus   . Cancer [facility])    neuroendocrine ca  . COPD (chronic obstructive pulmonary disease) ([place])   . Depression   . GERD (gastroesophageal reflux disease)   . Hypertension   . Motion sickness    reading in car  . 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 Orthodontics    permanent retainers, top and bottom  . Pneumonia 2017, 2020  . Sleep apnea     Past Surgical History:  Procedure Laterality Date  . ABDOMINAL HYSTERECTOMY    . BOWEL RESECTION  07/19/2012  . CESAREAN SECTION     x3  . ESOPHAGOGASTRODUODENOSCOPY (EGD) WITH PROPOFOL N/A 03/05/2017   Procedure: ESOPHAGOGASTRODUODENOSCOPY (EGD) WITH PROPOFOL;  Surgeon: Bailey Lame, MD;  Location: [place];  Service: Endoscopy;  Laterality: N/A;  . ESOPHAGOGASTRODUODENOSCOPY (EGD) WITH PROPOFOL N/A 03/30/2017   REPEAT IN 03/2019  . HAND SURGERY Right   . TRANSFORAMINAL LUMBAR INTERBODY FUSION (TLIF) WITH PEDICLE SCREW FIXATION 1 LEVEL N/A 12/05/2017   Procedure: TRANSFORAMINAL LUMBAR INTERBODY FUSION (TLIF) WITH PEDICLE SCREW FIXATION 1 LEVEL-L4-5;  Surgeon: Meade Maw, MD;  Location: [facility];  Service: Neurosurgery;  Laterality: N/A;    Prior to Admission medications   Medication Sig Start Date End Date Taking? Authorizing Provider  ADVAIR DISKUS 250-50 MCG/DOSE AEPB USE 1 INHALATION TWICE A DAY 07/02/18  Yes [name], MD  albuterol (PROVENTIL HFA;VENTOLIN HFA) 108 (90 Base) MCG/ACT inhaler Inhale 1-2 puffs into the lungs every 4 (four) hours as needed for wheezing or shortness of breath.    Yes  [provider]  buPROPion (WELLBUTRIN SR) 200 MG 12 hr tablet TAKE 1 TABLET TWICE A DAY 04/03/18  Yes [name], [name], MD  Cholecalciferol (VITAMIN D3) 25 MCG (1000 UT) CAPS Take by mouth.   Yes [provider]  gabapentin (NEURONTIN) 300 MG capsule Take 300 mg 3 (three) times daily by mouth.  02/15/17  Yes [provider]  HYDROcodone-acetaminophen (NORCO/VICODIN) 5-325 MG tablet Take by mouth. 12/18/18  Yes [provider]  ipratropium-albuterol (DUONEB) 0.5-2.5 (3) MG/3ML SOLN Inhale 3 mLs into the lungs every 4 (four) hours as needed (Only uses with cold symptoms).  01/16/17 03/17/19 Yes [provider]  lamoTRIgine (LAMICTAL) 100 MG tablet Take 3 tablets (300 mg total) by mouth daily. 04/04/18  Yes [name], MD  lisdexamfetamine (VYVANSE) 70 MG capsule Take 1 capsule (70 mg total) by mouth daily. 01/03/19  Yes [name], [name], MD  MAGNESIUM PO Take 500 mg by mouth every evening.    Yes [provider]  milk thistle 175 MG tablet Take 175 mg by mouth every evening.    Yes [provider]  montelukast (SINGULAIR) 10 MG tablet Take 10 mg by mouth at bedtime.  01/16/17  Yes [provider]  Multiple Vitamins-Minerals (CENTRUM SILVER PO) Take 1 tablet by mouth daily.   Yes [provider]  omeprazole (PRILOSEC) 40 MG capsule Take 40 mg by mouth daily.  01/16/17  Yes [provider]  SPIRIVA RESPIMAT 2.5 MCG/ACT AERS Inhale 2 puffs into the lungs every morning.  06/07/17  Yes [provider]  tiZANidine (ZANAFLEX) 4 MG tablet  10/01/18  Yes [provider]  valACYclovir (VALTREX) 1000 MG tablet  11/01/18  Yes [provider]  valsartan-hydrochlorothiazide (DIOVAN-HCT) 160-12.5 MG tablet  08/05/18  Yes [provider]  oxyCODONE (OXY IR/ROXICODONE) 5 MG immediate release tablet Take 1 tablet (5 mg total) by mouth every 3 (three) hours as needed for moderate pain ((score 4 to 6)).  Patient not taking: Reported on 02/27/2019 12/07/17   [name], PA-C  predniSONE (DELTASONE) 10 MG tablet TK 5 TS PO QD FOR 2 DAYS THEN DECREASE BY 1 T Q 2 DAYS UNTIL GONE 10/01/18   [provider]    Allergies as of 02/27/2019  . (No Known Allergies)    Family History  Problem Relation Age of Onset  . Breast cancer Cousin        mat cousin    Social History   Socioeconomic History  . Marital status: Married    Spouse name: Not on file  . Number of children: Not on file  . Years of education: Not on file  . Highest education level: Not on file  Occupational History  . Not on file  Social Needs  . Financial resource strain: Not on file  . Food insecurity    Worry: Not on file    Inability: Not on file  . Transportation needs    Medical: Not on file    Non-medical: Not on file  Tobacco Use  . Smoking status: Former Smoker    Packs/day: 2.00    Years: 37.00    Pack years: 74.00    Types: Cigarettes    Quit date: 07/19/2012    Years since quitting: 6.6  . Smokeless tobacco: Never Used  Substance and Sexual Activity  . Alcohol use: Yes    Comment: 2 drinks/mo  . Drug use: No  . Sexual activity: Yes  Lifestyle  . Physical activity    Days per week: Not on file    Minutes per session: Not on file  . Stress: Not on file  Relationships  . Social Herbalist on phone: Not on file    Gets together: Not on file    Attends religious service: Not on file    Active member of club or organization: Not on file    Attends meetings of clubs or organizations: Not on file    Relationship status: Not on file  . Intimate partner violence    Fear of current or ex partner: Not on file    Emotionally abused: Not on file    Physically abused: Not on file    Forced sexual activity: Not on file  Other Topics Concern  . Not on file  Social History Narrative  . Not on file    Review of Systems: See HPI, otherwise negative ROS  Physical Exam: BP 124/83    Pulse 93   Temp 98.1 F (36.7 C) (Temporal)   Resp 16   Ht 5' (1.524 m)   Wt 54.9 kg   SpO2 97%   BMI 23.63 kg/m  General:   Alert,  pleasant and cooperative in NAD Head:  Normocephalic and atraumatic. Neck:  Supple; no masses or thyromegaly. Lungs:  Clear throughout to auscultation.    Heart:  Regular rate and rhythm. Abdomen:  Soft, nontender and nondistended. Normal bowel sounds, without guarding, and without rebound.   Neurologic:  Alert and  oriented x4;  grossly normal neurologically.  Impression/Plan: Lyrae Mesnard is here for an endoscopy to be performed for gastric intestinal metaplasia  Risks, benefits, limitations, and alternatives regarding  endoscopy have been reviewed with the patient.  Questions have been answered.  All parties agreeable.   Bailey Lame, MD  03/17/2019, 8:25 AM

## 2019-03-17 NOTE — Anesthesia Preprocedure Evaluation (Signed)
Anesthesia Evaluation  Patient identified by MRN, date of birth, ID band Patient awake    Reviewed: Allergy & Precautions, H&P , NPO status , Patient's Chart, lab work & pertinent test results  Airway Mallampati: II  TM Distance: >3 FB Neck ROM: full    Dental no notable dental hx.    Pulmonary asthma , sleep apnea , COPD, former smoker,    Pulmonary exam normal breath sounds clear to auscultation       Cardiovascular hypertension, Normal cardiovascular exam Rhythm:regular Rate:Normal     Neuro/Psych    GI/Hepatic GERD  ,  Endo/Other    Renal/GU      Musculoskeletal   Abdominal   Peds  Hematology   Anesthesia Other Findings   Reproductive/Obstetrics                             Anesthesia Physical Anesthesia Plan  ASA: III  Anesthesia Plan: General   Post-op Pain Management:    Induction: Intravenous  PONV Risk Score and Plan: 3 and Propofol infusion, Treatment may vary due to age or medical condition and TIVA  Airway Management Planned: Natural Airway  Additional Equipment:   Intra-op Plan:   Post-operative Plan:   Informed Consent: I have reviewed the patients History and Physical, chart, labs and discussed the procedure including the risks, benefits and alternatives for the proposed anesthesia with the patient or authorized representative who has indicated his/her understanding and acceptance.       Plan Discussed with: CRNA  Anesthesia Plan Comments:         Anesthesia Quick Evaluation

## 2019-03-17 NOTE — Transfer of Care (Signed)
Immediate Anesthesia Transfer of Care Note  Patient: Bailey Flores  Procedure(s) Performed: ESOPHAGOGASTRODUODENOSCOPY (EGD) WITH PROPOFOL (N/A Esophagus) BIOPSY (Esophagus)  Patient Location: PACU  Anesthesia Type: General  Level of Consciousness: awake, alert  and patient cooperative  Airway and Oxygen Therapy: Patient Spontanous Breathing and Patient connected to supplemental oxygen  Post-op Assessment: Post-op Vital signs reviewed, Patient's Cardiovascular Status Stable, Respiratory Function Stable, Patent Airway and No signs of Nausea or vomiting  Post-op Vital Signs: Reviewed and stable  Complications: No apparent anesthesia complications

## 2019-03-17 NOTE — Op Note (Signed)
[facility] Gastroenterology Patient Name: Bailey Flores Procedure Date: 03/17/2019 9:04 AM MRN: [id number] Account #: [id number] Date of Birth: [birth year] Admit Type: Outpatient Age: 58 Room: [facility] OR ROOM 01 Gender: Female Note Status: Finalized Procedure:            Upper GI endoscopy Indications:          Intestinal metaplasia Providers:            [name] MD, MD Referring MD:         [name] (Referring MD) Medicines:            Propofol per Anesthesia Complications:        No immediate complications. Procedure:            Pre-Anesthesia Assessment:                       - Prior to the procedure, a History and Physical was                        performed, and patient medications and allergies were                        reviewed. The patient's tolerance of previous                        anesthesia was also reviewed. The risks and benefits of                        the procedure and the sedation options and risks were                        discussed with the patient. All questions were                        answered, and informed consent was obtained. Prior                        Anticoagulants: The patient has taken no previous                        anticoagulant or antiplatelet agents. ASA Grade                        Assessment: II - A patient with mild systemic disease.                        After reviewing the risks and benefits, the patient was                        deemed in satisfactory condition to undergo the                        procedure.                       After obtaining informed consent, the endoscope was                        passed under direct vision. Throughout the procedure,  the patient's blood pressure, pulse, and oxygen                        saturations were monitored continuously. The                        Endosonoscope was introduced through the mouth, and   advanced to the second part of duodenum. The upper GI                        endoscopy was accomplished without difficulty. The                        patient tolerated the procedure well. Findings:      The examined esophagus was normal.      Diffuse mild inflammation was found in the gastric antrum. This was       biopsied with a cold forceps for histology gastric mapping.      The examined duodenum was normal. Impression:           - Normal esophagus.                       - Gastritis. Biopsied.                       - Normal examined duodenum. Recommendation:       - Discharge patient to home.                       - Resume previous diet.                       - Continue present medications.                       - Await pathology results.                       - Repeat upper endoscopy in 2 years for surveillance. Procedure Code(s):    --- Professional ---                       [phone], Esophagogastroduodenoscopy, flexible, transoral;                        with biopsy, single or multiple Diagnosis Code(s):    --- Professional ---                       K31.89, Other diseases of stomach and duodenum                       K29.70, Gastritis, unspecified, without bleeding CPT copyright 2019 American Medical Association. All rights reserved. The codes documented in this report are preliminary and upon coder review may  be revised to meet current compliance requirements. [name] MD, MD 03/17/2019 9:20:34 AM This report has been signed electronically. Number of Addenda: 0 Note Initiated On: 03/17/2019 9:04 AM Total Procedure Duration: 0 hours 7 minutes 36 seconds  Estimated Blood Loss: Estimated blood loss: none. Estimated blood loss: none.      [facility]

## 2019-03-17 NOTE — Anesthesia Postprocedure Evaluation (Signed)
Anesthesia Post Note  Patient: Bailey Flores  Procedure(s) Performed: ESOPHAGOGASTRODUODENOSCOPY (EGD) WITH PROPOFOL (N/A Esophagus) BIOPSY (Esophagus)  Patient location during evaluation: PACU Anesthesia Type: General Level of consciousness: awake and alert and oriented Pain management: satisfactory to patient Vital Signs Assessment: post-procedure vital signs reviewed and stable Respiratory status: spontaneous breathing, nonlabored ventilation and respiratory function stable Cardiovascular status: blood pressure returned to baseline and stable Postop Assessment: Adequate PO intake and No signs of nausea or vomiting Anesthetic complications: no    Raliegh Ip

## 2019-03-18 ENCOUNTER — Encounter: Payer: Self-pay | Admitting: Gastroenterology

## 2019-03-19 ENCOUNTER — Encounter: Payer: Self-pay | Admitting: Gastroenterology

## 2019-03-25 ENCOUNTER — Ambulatory Visit (INDEPENDENT_AMBULATORY_CARE_PROVIDER_SITE_OTHER): Admitting: Internal Medicine

## 2019-03-25 ENCOUNTER — Other Ambulatory Visit: Payer: Self-pay

## 2019-03-25 ENCOUNTER — Encounter: Payer: Self-pay | Admitting: Internal Medicine

## 2019-03-25 VITALS — BP 136/92 | HR 101 | Temp 97.7°F | Ht <= 58 in | Wt 124.0 lb

## 2019-03-25 DIAGNOSIS — J449 Chronic obstructive pulmonary disease, unspecified: Secondary | ICD-10-CM

## 2019-03-25 DIAGNOSIS — G4733 Obstructive sleep apnea (adult) (pediatric): Secondary | ICD-10-CM | POA: Diagnosis not present

## 2019-03-25 NOTE — Progress Notes (Signed)
[place] [facility] Consultation      **PFT 09/06/2017>> tracings personally reviewed.  FVC is 82% predicted, FEV1 is 62% predicted.  Ratio 62%.  There is no significant improvement bronchodilator therapy.  TLC 79% protected, RV to TLC ratio is normal.  DLCO is 80%.  Flow volume loop suggest obstruction. -Overall this test shows moderate obstructive lung disease.  **CBC 01/24/18>> Abs eos 100.  ** HST 07/03/17>>Moderate OSA with AHI of 20.   **Prevnar 13; given 05/04/17 **Desat walk 05/04/17 on RA at rest; 96% and HR 90. Walked 180 feet, conversational no dyspnea. Sat was 91% and HR 111.  **Imaging personally reviewed; low dose CT chest 05/01/17; no nodules, mild emphysema per report but otherwise lungs appear normal.       Date: 03/25/2019  MRN# [id number] Janita Croshaw Sippel [birth year]    Julaine Fusi Sollers is a 58 y.o. old female seen in consultation for chief complaint of:    Chief Complaint  Patient presents with  . Follow-up    HPI  AHI 20 does not wear CPAP does wear oral device Patient does not want CPAP therapy Feels well with this therapy No issues noted  COPD seems to be stable Currently on Spiriva and Advair  No COPD exacerbation at this time No evidence of heart failure at this time No evidence or signs of infection at this time No respiratory distress No fevers, chills, nausea, vomiting, diarrhea No evidence of lower extremity edema No evidence hemoptysis  Former smoker quit 5 years ago   Previous history of breast cancer status post resection Follows Dr. Mike Gip    Medication:    Current Outpatient Medications:  .  ADVAIR DISKUS 250-50 MCG/DOSE AEPB, USE 1 INHALATION TWICE A DAY, Disp: 180 each, Rfl: 4 .  albuterol (PROVENTIL HFA;VENTOLIN HFA) 108 (90 Base) MCG/ACT inhaler, Inhale 1-2 puffs into the lungs every 4 (four) hours as needed for wheezing or shortness of breath. , Disp: , Rfl:  .  buPROPion (WELLBUTRIN SR) 200 MG 12  hr tablet, TAKE 1 TABLET TWICE A DAY, Disp: 180 tablet, Rfl: 4 .  Cholecalciferol (VITAMIN D3) 25 MCG (1000 UT) CAPS, Take by mouth., Disp: , Rfl:  .  gabapentin (NEURONTIN) 300 MG capsule, Take 300 mg 3 (three) times daily by mouth. , Disp: , Rfl:  .  HYDROcodone-acetaminophen (NORCO/VICODIN) 5-325 MG tablet, Take by mouth., Disp: , Rfl:  .  ipratropium-albuterol (DUONEB) 0.5-2.5 (3) MG/3ML SOLN, Inhale 3 mLs into the lungs every 4 (four) hours as needed (Only uses with cold symptoms). , Disp: , Rfl:  .  lamoTRIgine (LAMICTAL) 100 MG tablet, Take 3 tablets (300 mg total) by mouth daily., Disp: 270 tablet, Rfl: 4 .  lisdexamfetamine (VYVANSE) 70 MG capsule, Take 1 capsule (70 mg total) by mouth daily., Disp: 90 capsule, Rfl: 0 .  MAGNESIUM PO, Take 500 mg by mouth every evening. , Disp: , Rfl:  .  milk thistle 175 MG tablet, Take 175 mg by mouth every evening. , Disp: , Rfl:  .  montelukast (SINGULAIR) 10 MG tablet, Take 10 mg by mouth at bedtime. , Disp: , Rfl:  .  Multiple Vitamins-Minerals (CENTRUM SILVER PO), Take 1 tablet by mouth daily., Disp: , Rfl:  .  omeprazole (PRILOSEC) 40 MG capsule, Take 40 mg by mouth daily. , Disp: , Rfl:  .  oxyCODONE (OXY IR/ROXICODONE) 5 MG immediate release tablet, Take 1 tablet (5 mg total) by mouth every 3 (three) hours as needed for moderate pain ((  score 4 to 6)). (Patient not taking: Reported on 02/27/2019), Disp: 30 tablet, Rfl: 0 .  predniSONE (DELTASONE) 10 MG tablet, TK 5 TS PO QD FOR 2 DAYS THEN DECREASE BY 1 T Q 2 DAYS UNTIL GONE, Disp: , Rfl:  .  SPIRIVA RESPIMAT 2.5 MCG/ACT AERS, Inhale 2 puffs into the lungs every morning. , Disp: , Rfl:  .  tiZANidine (ZANAFLEX) 4 MG tablet, , Disp: , Rfl:  .  valACYclovir (VALTREX) 1000 MG tablet, , Disp: , Rfl:  .  valsartan-hydrochlorothiazide (DIOVAN-HCT) 160-12.5 MG tablet, , Disp: , Rfl:    Allergies:  Patient has no known allergies.   Review of Systems:  Gen:  Denies  fever, sweats, chills weight loss   HEENT: Denies blurred vision, double vision, ear pain, eye pain, hearing loss, nose bleeds, sore throat Cardiac:  No dizziness, chest pain or heaviness, chest tightness,edema, No JVD Resp:   No cough, -sputum production, -shortness of breath,-wheezing, -hemoptysis,  Gi: Denies swallowing difficulty, stomach pain, nausea or vomiting, diarrhea, constipation, bowel incontinence Gu:  Denies bladder incontinence, burning urine Ext:   Denies Joint pain, stiffness or swelling Skin: Denies  skin rash, easy bruising or bleeding or hives Endoc:  Denies polyuria, polydipsia , polyphagia or weight change Psych:   Denies depression, insomnia or hallucinations  Other:  All other systems negative   BP (!) 136/92 (BP Location: Left Arm, Cuff Size: Normal)   Pulse (!) 101   Temp 97.7 F (36.5 C) (Temporal)   Ht 4\' 8"  (1.422 m)   Wt 124 lb (56.2 kg)   SpO2 96%   BMI 27.80 kg/m   Physical Examination:   GENERAL:NAD, no fevers, chills, no weakness no fatigue HEAD: Normocephalic, atraumatic.  EYES: PERLA, EOMI No scleral icterus.  NECK: Supple. No thyromegaly.  No JVD.  PULMONARY: CTA B/L no wheezing, rhonchi, crackles CARDIOVASCULAR: S1 and S2. Regular rate and rhythm. No murmurs GASTROINTESTINAL: Soft, nontender, nondistended. Positive bowel sounds.  MUSCULOSKELETAL: No swelling, clubbing, or edema.  NEUROLOGIC: No gross focal neurological deficits. 5/5 strength all extremities SKIN: No ulceration, lesions, rashes, or cyanosis.  PSYCHIATRIC: Insight, judgment intact. -depression -anxiety ALL OTHER ROS ARE NEGATIVE      58 year old pleasant female with history of COPD and sleep apnea chronic dyspnea and exertion and deconditioned state   COPD moderate Gold stage D  Emphysema on CT scan  Currently on Advair and Spiriva  No evidence of exacerbation at this time   Shortness of breath and dyspnea exertion Progressive over the last several months Patient needs to be tested for exertional  and nocturnal hypoxia  Former smoker with COPD  Patient is enrolled in lung cancer screening program    Home sleep study with OSA AHI of 20  Patient has declined CPAP therapy  Patient asked for mandibular oral device however she refuses to wear this at this time due to cost   Obesity -recommend significant weight loss -recommend changing diet  Deconditioned state -Recommend increased daily activity and exercise   COVID-19 EDUCATION: The signs and symptoms of COVID-19 were discussed with the patient and how to seek care for testing.  The importance of social distancing was discussed today. Hand Washing Techniques and avoid touching face was advised.     MEDICATION ADJUSTMENTS/LABS AND TESTS ORDERED: Continue inhalers as prescribed Obtain 6-minute walk test Obtain overnight pulse oximetry   CURRENT MEDICATIONS REVIEWED AT LENGTH WITH PATIENT TODAY   Patient satisfied with Plan of action and management. All questions answered  Follow up in 6 months   Demario Faniel Patricia Pesa, M.D.  [facility]  Medical Director [place] Director [facility] Cardio-Pulmonary Department

## 2019-03-25 NOTE — Patient Instructions (Addendum)
Continue inhalers as prescribed Check 6MWT and ONO Follow up CT chest lung cancer screening

## 2019-03-26 ENCOUNTER — Telehealth: Payer: Self-pay

## 2019-03-26 DIAGNOSIS — F900 Attention-deficit hyperactivity disorder, predominantly inattentive type: Secondary | ICD-10-CM

## 2019-03-26 NOTE — Telephone Encounter (Signed)
pt called states she needs a refill [facility]

## 2019-03-27 ENCOUNTER — Other Ambulatory Visit: Payer: Self-pay | Admitting: Neurosurgery

## 2019-03-27 MED ORDER — LISDEXAMFETAMINE DIMESYLATE 70 MG PO CAPS: 70.0000 mg | ORAL_CAPSULE | Freq: Every day | ORAL | 0 refills | Status: DC

## 2019-03-27 NOTE — Telephone Encounter (Signed)
Sent vyvanse to pharmacy.

## 2019-04-02 ENCOUNTER — Other Ambulatory Visit: Payer: Self-pay

## 2019-04-02 ENCOUNTER — Other Ambulatory Visit
Admission: RE | Admit: 2019-04-02 | Discharge: 2019-04-02 | Disposition: A | Discharge status: Home / Self Care | Source: Ambulatory Visit | Attending: Neurosurgery | Admitting: Neurosurgery

## 2019-04-02 DIAGNOSIS — Z01812 Encounter for preprocedural laboratory examination: Secondary | ICD-10-CM | POA: Insufficient documentation

## 2019-04-02 LAB — BASIC METABOLIC PANEL
Anion gap: 10 (ref 5–15)
BUN: 19 mg/dL (ref 6–20)
CO2: 27 mmol/L (ref 22–32)
Calcium: 9.3 mg/dL (ref 8.9–10.3)
Chloride: 103 mmol/L (ref 98–111)
Creatinine, Ser: 0.68 mg/dL (ref 0.44–1.00)
GFR calc Af Amer: >60 mL/min (ref >60)
GFR calc non Af Amer: >60 mL/min (ref >60)
Glucose, Bld: 94 mg/dL (ref 70–99)
Potassium: 4.3 mmol/L (ref 3.5–5.1)
Sodium: 140 mmol/L (ref 135–145)

## 2019-04-02 LAB — CBC
HCT: 39.2 % (ref 36.0–46.0)
Hemoglobin: 12.8 g/dL (ref 12.0–15.0)
MCH: 29.2 pg (ref 26.0–34.0)
MCHC: 32.7 g/dL (ref 30.0–36.0)
MCV: 89.5 fL (ref 80.0–100.0)
Platelets: 265 10*3/uL (ref 150–400)
RBC: 4.38 MIL/uL (ref 3.87–5.11)
RDW: 15.5 % (ref 11.5–15.5)
WBC: 8.7 10*3/uL (ref 4.0–10.5)
nRBC: 0 % (ref 0.0–0.2)

## 2019-04-02 LAB — URINALYSIS, ROUTINE W REFLEX MICROSCOPIC
Bilirubin Urine: NEGATIVE
Glucose, UA: NEGATIVE mg/dL
Hgb urine dipstick: NEGATIVE
Ketones, ur: NEGATIVE mg/dL
Leukocytes,Ua: NEGATIVE
Nitrite: NEGATIVE
Protein, ur: NEGATIVE mg/dL
Specific Gravity, Urine: 1.025 (ref 1.005–1.030)
pH: 5 (ref 5.0–8.0)

## 2019-04-02 LAB — TYPE AND SCREEN
ABO/RH(D): A POS
Antibody Screen: NEGATIVE

## 2019-04-02 LAB — PROTIME-INR
INR: 1 (ref 0.8–1.2)
Prothrombin Time: 12.7 seconds (ref 11.4–15.2)

## 2019-04-02 LAB — SURGICAL PCR SCREEN
MRSA, PCR: NEGATIVE
Staphylococcus aureus: NEGATIVE

## 2019-04-02 LAB — APTT: aPTT: 31 seconds (ref 24–36)

## 2019-04-02 NOTE — Patient Instructions (Signed)
Your procedure is scheduled on: Wednesday 04/09/19 Report to [place]. To find out your arrival time please call [phone] between 1PM - 3PM on Tuesday 04/08/19.  Remember: Instructions that are not followed completely may result in serious medical risk, up to and including death, or upon the discretion of your surgeon and anesthesiologist your surgery may need to be rescheduled.     _X__ 1. Do not eat food after midnight the night before your procedure.                 No gum chewing or hard candies. You may drink clear liquids up to 2 hours                 before you are scheduled to arrive for your surgery- DO not drink clear                 liquids within 2 hours of the start of your surgery.                 Clear Liquids include:  water, apple juice without pulp, clear carbohydrate                 drink such as Clearfast or Gatorade, Black Coffee or Tea (Do not add                 anything to coffee or tea). Diabetics water only  __X__2.  On the morning of surgery brush your teeth with toothpaste and water, you                 may rinse your mouth with mouthwash if you wish.  Do not swallow any              toothpaste of mouthwash.     _X__ 3.  No Alcohol for 24 hours before or after surgery.   _X__ 4.  Do Not Smoke or use e-cigarettes For 24 Hours Prior to Your Surgery.                 Do not use any chewable tobacco products for at least 6 hours prior to                 surgery.  ____  5.  Bring all medications with you on the day of surgery if instructed.   __X__  6.  Notify your doctor if there is any change in your medical condition      (cold, fever, infections).     Do not wear jewelry, make-up, hairpins, clips or nail polish. Do not wear lotions, powders, or perfumes.  Do not shave 48 hours prior to surgery. Men may shave face and neck. Do not bring valuables to the hospital.    [facility] is not responsible  for any belongings or valuables.  Contacts, dentures/partials or body piercings may not be worn into surgery. Bring a case for your contacts, glasses or hearing aids, a denture cup will be supplied. Leave your suitcase in the car. After surgery it may be brought to your room. For patients admitted to the hospital, discharge time is determined by your treatment team.   Patients discharged the day of surgery will not be allowed to drive home.   Please read over the following fact sheets that you were given:   MRSA Information  __X__ Take these medicines the morning of surgery with A SIP OF WATER:  1. buPROPion (WELLBUTRIN   2. gabapentin (NEURONTIN  3. lamoTRIgine (LAMICTAL  4. omeprazole (PRILOSEC  5.  6.  ____ Fleet Enema (as directed)   __X__ Use CHG Soap/SAGE wipes as directed  __X__ Use inhalers on the day of surgery  ____ Stop metformin/Janumet/Farxiga 2 days prior to surgery    ____ Take 1/2 of usual insulin dose the night before surgery. No insulin the morning          of surgery.   ____ Stop Blood Thinners Coumadin/Plavix/Xarelto/Pleta/Pradaxa/Eliquis/Effient/Aspirin  on   Or contact your Surgeon, Cardiologist or Medical Doctor regarding  ability to stop your blood thinners  __X__ Stop Anti-inflammatories 7 days before surgery such as Advil, Ibuprofen, Motrin,  BC or Goodies Powder, Naprosyn, Naproxen, Aleve, Aspirin   Stop ibuprofen today  __X__ Stop all herbal supplements, fish oil or vitamin E until after surgery.    ____ Bring C-Pap to the hospital.

## 2019-04-04 ENCOUNTER — Other Ambulatory Visit: Payer: Self-pay

## 2019-04-04 ENCOUNTER — Other Ambulatory Visit
Admission: RE | Admit: 2019-04-04 | Discharge: 2019-04-04 | Disposition: A | Discharge status: Home / Self Care | Source: Ambulatory Visit | Attending: Neurosurgery | Admitting: Neurosurgery

## 2019-04-04 DIAGNOSIS — Z01812 Encounter for preprocedural laboratory examination: Secondary | ICD-10-CM | POA: Insufficient documentation

## 2019-04-04 DIAGNOSIS — Z20828 Contact with and (suspected) exposure to other viral communicable diseases: Secondary | ICD-10-CM | POA: Insufficient documentation

## 2019-04-04 LAB — SARS CORONAVIRUS 2 (TAT 6-24 HRS): SARS Coronavirus 2: NEGATIVE

## 2019-04-07 ENCOUNTER — Ambulatory Visit (INDEPENDENT_AMBULATORY_CARE_PROVIDER_SITE_OTHER)

## 2019-04-07 ENCOUNTER — Other Ambulatory Visit: Payer: Self-pay

## 2019-04-07 ENCOUNTER — Telehealth: Payer: Self-pay | Admitting: Internal Medicine

## 2019-04-07 DIAGNOSIS — J449 Chronic obstructive pulmonary disease, unspecified: Secondary | ICD-10-CM | POA: Diagnosis not present

## 2019-04-07 NOTE — Telephone Encounter (Signed)
ok 

## 2019-04-07 NOTE — Telephone Encounter (Signed)
Pt in office for SMW. Prior to test starting pt's spo2 was at 88%. Spo2 recovered to 93% and test was started per pt's request.  test stopped at 2:34 to check spo2 per tech's judgment to monitor sats.  At time of test being paused pt's spo2 was at 86% HR 110. Pt did not have complaints of sob during test. pt was placed on 2L and walked two laps and maintained at 97%. Pt and spouse would like to hold off on ordering oxygen for now, as they would like to go home and walk pt without mask to see if they get the same results.  Pt will call back with update.   Will route to DK as an [place].

## 2019-04-07 NOTE — Progress Notes (Signed)
SIX MIN WALK 04/07/2019  Medications Benadryl 25mg , Advair 250, Gabapentin 300mg , Norco 10, Lamictal 100mg , Vyvanse 70mg , Prilosec 40mg , Spiriva 2.60mch all taken at 8:30a  Laps 7  Partial Lap (in Meters) 0  Baseline BP (sitting) 132/76  Baseline Heartrate 88  Baseline Dyspnea (Borg Scale) 0  Baseline Fatigue (Borg Scale) 1  Baseline SPO2 93  Distance Completed 238  Tech Comments: test stopped at 2:34 to check spo2, as pt was sating around 88% at begining of test but recovered to 93%. when test was paused pt's spo2 was at 86% HR 110. Pt did not have complaints of sob. pt was placed on 2L and maintained at 97%

## 2019-04-08 NOTE — Telephone Encounter (Signed)
This is NOT official assessment If she does NOT want oxygen therapy, we will adhere to patients request. Recommend repeat 6WMT in next 3 months if patient is willing

## 2019-04-08 NOTE — Telephone Encounter (Signed)
Spoke to pt for update. Pt stated that she did not have time yesterday to check her oxygen levels without mask, however she will today.  Pt will call back with update.

## 2019-04-08 NOTE — Telephone Encounter (Signed)
Pt called back with update on oxygen levels.  Pt stated that she walked around her yard for 3min without a mask spo2 was 95% prior to starting test and 93% after test. Pt is concerned that wearing a mask during SMW here in our office caused her oxygen levels to drop.   DK please advise. Thanks

## 2019-04-09 ENCOUNTER — Encounter
Admission: RE | Disposition: A | Discharge status: Home Health | Payer: Self-pay | Source: Home / Self Care | Attending: Neurosurgery

## 2019-04-09 ENCOUNTER — Inpatient Hospital Stay
Admission: RE | Admit: 2019-04-09 | Discharge: 2019-04-11 | DRG: 472 | Disposition: A | Discharge status: Home Health | Attending: Neurosurgery | Admitting: Neurosurgery

## 2019-04-09 ENCOUNTER — Inpatient Hospital Stay

## 2019-04-09 ENCOUNTER — Encounter: Payer: Self-pay | Admitting: *Deleted

## 2019-04-09 ENCOUNTER — Inpatient Hospital Stay: Admitting: Certified Registered"

## 2019-04-09 DIAGNOSIS — F319 Bipolar disorder, unspecified: Secondary | ICD-10-CM | POA: Diagnosis present

## 2019-04-09 DIAGNOSIS — K219 Gastro-esophageal reflux disease without esophagitis: Secondary | ICD-10-CM | POA: Diagnosis present

## 2019-04-09 DIAGNOSIS — G4730 Sleep apnea, unspecified: Secondary | ICD-10-CM | POA: Diagnosis present

## 2019-04-09 DIAGNOSIS — Z820 Family history of epilepsy and other diseases of the nervous system: Secondary | ICD-10-CM

## 2019-04-09 DIAGNOSIS — Z981 Arthrodesis status: Secondary | ICD-10-CM

## 2019-04-09 DIAGNOSIS — Z825 Family history of asthma and other chronic lower respiratory diseases: Secondary | ICD-10-CM

## 2019-04-09 DIAGNOSIS — Z7982 Long term (current) use of aspirin: Secondary | ICD-10-CM

## 2019-04-09 DIAGNOSIS — Z8249 Family history of ischemic heart disease and other diseases of the circulatory system: Secondary | ICD-10-CM

## 2019-04-09 DIAGNOSIS — M1990 Unspecified osteoarthritis, unspecified site: Secondary | ICD-10-CM | POA: Diagnosis present

## 2019-04-09 DIAGNOSIS — M4312 Spondylolisthesis, cervical region: Secondary | ICD-10-CM | POA: Diagnosis present

## 2019-04-09 DIAGNOSIS — Z800 Family history of malignant neoplasm of digestive organs: Secondary | ICD-10-CM

## 2019-04-09 DIAGNOSIS — Z833 Family history of diabetes mellitus: Secondary | ICD-10-CM

## 2019-04-09 DIAGNOSIS — F902 Attention-deficit hyperactivity disorder, combined type: Secondary | ICD-10-CM | POA: Diagnosis present

## 2019-04-09 DIAGNOSIS — Z79899 Other long term (current) drug therapy: Secondary | ICD-10-CM

## 2019-04-09 DIAGNOSIS — Z90710 Acquired absence of both cervix and uterus: Secondary | ICD-10-CM

## 2019-04-09 DIAGNOSIS — Z87891 Personal history of nicotine dependence: Secondary | ICD-10-CM

## 2019-04-09 DIAGNOSIS — Z8589 Personal history of malignant neoplasm of other organs and systems: Secondary | ICD-10-CM

## 2019-04-09 DIAGNOSIS — Z9049 Acquired absence of other specified parts of digestive tract: Secondary | ICD-10-CM

## 2019-04-09 DIAGNOSIS — M4802 Spinal stenosis, cervical region: Principal | ICD-10-CM | POA: Diagnosis present

## 2019-04-09 DIAGNOSIS — M4712 Other spondylosis with myelopathy, cervical region: Secondary | ICD-10-CM | POA: Diagnosis present

## 2019-04-09 DIAGNOSIS — K0889 Other specified disorders of teeth and supporting structures: Secondary | ICD-10-CM | POA: Diagnosis present

## 2019-04-09 DIAGNOSIS — Z8349 Family history of other endocrine, nutritional and metabolic diseases: Secondary | ICD-10-CM

## 2019-04-09 DIAGNOSIS — Z8051 Family history of malignant neoplasm of kidney: Secondary | ICD-10-CM

## 2019-04-09 DIAGNOSIS — I10 Essential (primary) hypertension: Secondary | ICD-10-CM | POA: Diagnosis present

## 2019-04-09 DIAGNOSIS — F419 Anxiety disorder, unspecified: Secondary | ICD-10-CM | POA: Diagnosis present

## 2019-04-09 DIAGNOSIS — Z8261 Family history of arthritis: Secondary | ICD-10-CM

## 2019-04-09 DIAGNOSIS — M2578 Osteophyte, vertebrae: Secondary | ICD-10-CM | POA: Diagnosis present

## 2019-04-09 DIAGNOSIS — Z7951 Long term (current) use of inhaled steroids: Secondary | ICD-10-CM

## 2019-04-09 DIAGNOSIS — Z818 Family history of other mental and behavioral disorders: Secondary | ICD-10-CM

## 2019-04-09 DIAGNOSIS — G3184 Mild cognitive impairment, so stated: Secondary | ICD-10-CM | POA: Diagnosis present

## 2019-04-09 DIAGNOSIS — J439 Emphysema, unspecified: Secondary | ICD-10-CM | POA: Diagnosis present

## 2019-04-09 DIAGNOSIS — Z419 Encounter for procedure for purposes other than remedying health state, unspecified: Secondary | ICD-10-CM

## 2019-04-09 HISTORY — PX: ANTERIOR CERVICAL DECOMPRESSION/DISCECTOMY FUSION 4 LEVELS: SHX5556

## 2019-04-09 SURGERY — ANTERIOR CERVICAL DECOMPRESSION/DISCECTOMY FUSION 4 LEVELS
Anesthesia: General | Site: Neck

## 2019-04-09 MED ORDER — DIPHENHYDRAMINE HCL 25 MG PO CAPS: 25.0000 mg | ORAL_CAPSULE | Freq: Every day | ORAL | Status: DC | PRN

## 2019-04-09 MED ORDER — HYDROMORPHONE HCL 1 MG/ML IJ SOLN
0.5000 mg | INTRAMUSCULAR | Status: AC | PRN
  Administered 2019-04-09 (×4): 0.5 mg via INTRAVENOUS

## 2019-04-09 MED ORDER — LISDEXAMFETAMINE DIMESYLATE 70 MG PO CAPS
70.0000 mg | ORAL_CAPSULE | Freq: Every day | ORAL | Status: DC
  Administered 2019-04-10 – 2019-04-11 (×2): 70 mg via ORAL
  Filled 2019-04-09 (×2): qty 1

## 2019-04-09 MED ORDER — SODIUM CHLORIDE (PF) 0.9 % IJ SOLN
INTRAMUSCULAR | Status: DC | PRN
  Administered 2019-04-09: 10 mL

## 2019-04-09 MED ORDER — ONDANSETRON HCL 4 MG/2ML IJ SOLN
INTRAMUSCULAR | Status: DC | PRN
  Administered 2019-04-09: 4 mg via INTRAVENOUS

## 2019-04-09 MED ORDER — DIAZEPAM 5 MG PO TABS
5.0000 mg | ORAL_TABLET | Freq: Once | ORAL | Status: AC
  Administered 2019-04-09: 22:00:00 5 mg via ORAL

## 2019-04-09 MED ORDER — MONTELUKAST SODIUM 10 MG PO TABS
10.0000 mg | ORAL_TABLET | Freq: Every day | ORAL | Status: DC
  Administered 2019-04-10: 10 mg via ORAL
  Filled 2019-04-09: qty 1

## 2019-04-09 MED ORDER — SODIUM CHLORIDE 0.9 % IV SOLN: 250.0000 mL | INTRAVENOUS | Status: DC

## 2019-04-09 MED ORDER — CEFAZOLIN SODIUM-DEXTROSE 2-4 GM/100ML-% IV SOLN
INTRAVENOUS | Status: AC
  Filled 2019-04-09: qty 100

## 2019-04-09 MED ORDER — DIAZEPAM 5 MG PO TABS
ORAL_TABLET | ORAL | Status: AC
  Administered 2019-04-09: 5 mg via ORAL
  Filled 2019-04-09: qty 1

## 2019-04-09 MED ORDER — FENTANYL CITRATE (PF) 100 MCG/2ML IJ SOLN
INTRAMUSCULAR | Status: AC
  Administered 2019-04-09: 25 ug via INTRAVENOUS
  Filled 2019-04-09: qty 2

## 2019-04-09 MED ORDER — POLYETHYLENE GLYCOL 3350 17 G PO PACK: 17.0000 g | PACK | Freq: Every day | ORAL | Status: DC | PRN

## 2019-04-09 MED ORDER — ONDANSETRON HCL 4 MG/2ML IJ SOLN
INTRAMUSCULAR | Status: AC
  Filled 2019-04-09: qty 2

## 2019-04-09 MED ORDER — SODIUM CHLORIDE 0.9% FLUSH
3.0000 mL | INTRAVENOUS | Status: DC | PRN
  Administered 2019-04-11: 3 mL via INTRAVENOUS
  Filled 2019-04-09: qty 3

## 2019-04-09 MED ORDER — SODIUM CHLORIDE 0.9 % IV SOLN
INTRAVENOUS | Status: DC | PRN
  Administered 2019-04-09: 25 ug/min via INTRAVENOUS

## 2019-04-09 MED ORDER — DEXAMETHASONE SODIUM PHOSPHATE 10 MG/ML IJ SOLN
INTRAMUSCULAR | Status: AC
  Filled 2019-04-09: qty 1

## 2019-04-09 MED ORDER — FENTANYL CITRATE (PF) 100 MCG/2ML IJ SOLN
25.0000 ug | INTRAMUSCULAR | Status: AC | PRN
  Administered 2019-04-09 (×6): 25 ug via INTRAVENOUS

## 2019-04-09 MED ORDER — PROPOFOL 10 MG/ML IV BOLUS
INTRAVENOUS | Status: DC | PRN
  Administered 2019-04-09: 100 mg via INTRAVENOUS
  Administered 2019-04-09: 150 mg via INTRAVENOUS

## 2019-04-09 MED ORDER — LIDOCAINE HCL (PF) 2 % IJ SOLN
INTRAMUSCULAR | Status: AC
  Filled 2019-04-09: qty 10

## 2019-04-09 MED ORDER — SODIUM CHLORIDE FLUSH 0.9 % IV SOLN
INTRAVENOUS | Status: AC
  Filled 2019-04-09: qty 10

## 2019-04-09 MED ORDER — ACETAMINOPHEN 325 MG PO TABS: 650.0000 mg | ORAL_TABLET | ORAL | Status: DC | PRN

## 2019-04-09 MED ORDER — ACETAMINOPHEN 10 MG/ML IV SOLN
INTRAVENOUS | Status: AC
  Filled 2019-04-09: qty 100

## 2019-04-09 MED ORDER — IRBESARTAN 150 MG PO TABS
150.0000 mg | ORAL_TABLET | Freq: Every day | ORAL | Status: DC
  Administered 2019-04-10: 150 mg via ORAL
  Filled 2019-04-09: qty 1

## 2019-04-09 MED ORDER — MOMETASONE FURO-FORMOTEROL FUM 200-5 MCG/ACT IN AERO
2.0000 | INHALATION_SPRAY | Freq: Two times a day (BID) | RESPIRATORY_TRACT | Status: DC
  Administered 2019-04-10 – 2019-04-11 (×3): 2 via RESPIRATORY_TRACT
  Filled 2019-04-09: qty 8.8

## 2019-04-09 MED ORDER — METHOCARBAMOL 500 MG PO TABS
750.0000 mg | ORAL_TABLET | Freq: Four times a day (QID) | ORAL | Status: DC
  Administered 2019-04-10 (×2): 750 mg via ORAL
  Filled 2019-04-09 (×2): qty 2
  Filled 2019-04-09 (×2): qty 1

## 2019-04-09 MED ORDER — BACITRACIN 50000 UNITS IM SOLR
INTRAMUSCULAR | Status: AC
  Filled 2019-04-09: qty 1

## 2019-04-09 MED ORDER — ALBUTEROL SULFATE (2.5 MG/3ML) 0.083% IN NEBU: 2.5000 mg | INHALATION_SOLUTION | RESPIRATORY_TRACT | Status: DC | PRN

## 2019-04-09 MED ORDER — ACETAMINOPHEN 500 MG PO TABS
1000.0000 mg | ORAL_TABLET | Freq: Four times a day (QID) | ORAL | Status: AC
  Administered 2019-04-10 (×4): 1000 mg via ORAL
  Filled 2019-04-09 (×4): qty 2

## 2019-04-09 MED ORDER — MIDAZOLAM HCL 2 MG/2ML IJ SOLN
INTRAMUSCULAR | Status: AC
  Filled 2019-04-09: qty 2

## 2019-04-09 MED ORDER — SENNA 8.6 MG PO TABS
1.0000 | ORAL_TABLET | Freq: Two times a day (BID) | ORAL | Status: DC
  Administered 2019-04-10 – 2019-04-11 (×3): 8.6 mg via ORAL
  Filled 2019-04-09 (×3): qty 1

## 2019-04-09 MED ORDER — TIOTROPIUM BROMIDE MONOHYDRATE 18 MCG IN CAPS
1.0000 | ORAL_CAPSULE | RESPIRATORY_TRACT | Status: DC
  Administered 2019-04-10 – 2019-04-11 (×2): 18 ug via RESPIRATORY_TRACT
  Filled 2019-04-09: qty 5

## 2019-04-09 MED ORDER — THROMBIN 5000 UNITS EX SOLR
CUTANEOUS | Status: AC
  Filled 2019-04-09: qty 10000

## 2019-04-09 MED ORDER — VALSARTAN-HYDROCHLOROTHIAZIDE 160-12.5 MG PO TABS: 1.0000 | ORAL_TABLET | Freq: Every day | ORAL | Status: DC

## 2019-04-09 MED ORDER — SODIUM CHLORIDE 0.9% FLUSH
3.0000 mL | Freq: Two times a day (BID) | INTRAVENOUS | Status: DC
  Administered 2019-04-10 (×2): 3 mL via INTRAVENOUS

## 2019-04-09 MED ORDER — PROPOFOL 500 MG/50ML IV EMUL
INTRAVENOUS | Status: DC | PRN
  Administered 2019-04-09: 125 ug/kg/min via INTRAVENOUS

## 2019-04-09 MED ORDER — REMIFENTANIL HCL 1 MG IV SOLR
INTRAVENOUS | Status: AC
  Filled 2019-04-09: qty 1000

## 2019-04-09 MED ORDER — BUPROPION HCL ER (SR) 100 MG PO TB12
200.0000 mg | ORAL_TABLET | Freq: Two times a day (BID) | ORAL | Status: DC
  Administered 2019-04-10 – 2019-04-11 (×3): 200 mg via ORAL
  Filled 2019-04-09 (×5): qty 2

## 2019-04-09 MED ORDER — LAMOTRIGINE 25 MG PO TABS
100.0000 mg | ORAL_TABLET | Freq: Every morning | ORAL | Status: DC
  Administered 2019-04-10 – 2019-04-11 (×2): 100 mg via ORAL
  Filled 2019-04-09 (×2): qty 4

## 2019-04-09 MED ORDER — SODIUM CHLORIDE 0.9 % IV SOLN
INTRAVENOUS | Status: DC
  Administered 2019-04-09 – 2019-04-10 (×2): via INTRAVENOUS

## 2019-04-09 MED ORDER — HYDROMORPHONE HCL 1 MG/ML IJ SOLN
INTRAMUSCULAR | Status: AC
  Administered 2019-04-09: 0.5 mg via INTRAVENOUS
  Filled 2019-04-09: qty 1

## 2019-04-09 MED ORDER — MENTHOL 3 MG MT LOZG
1.0000 | LOZENGE | OROMUCOSAL | Status: DC | PRN
  Filled 2019-04-09: qty 9

## 2019-04-09 MED ORDER — METHOCARBAMOL 1000 MG/10ML IJ SOLN
750.0000 mg | Freq: Four times a day (QID) | INTRAVENOUS | Status: DC
  Administered 2019-04-09: 750 mg via INTRAVENOUS
  Filled 2019-04-09 (×8): qty 7.5

## 2019-04-09 MED ORDER — ACETAMINOPHEN 650 MG RE SUPP: 650.0000 mg | RECTAL | Status: DC | PRN

## 2019-04-09 MED ORDER — PROPOFOL 500 MG/50ML IV EMUL
INTRAVENOUS | Status: AC
  Filled 2019-04-09: qty 50

## 2019-04-09 MED ORDER — EPINEPHRINE PF 1 MG/ML IJ SOLN
INTRAMUSCULAR | Status: AC
  Filled 2019-04-09: qty 1

## 2019-04-09 MED ORDER — GABAPENTIN 300 MG PO CAPS
300.0000 mg | ORAL_CAPSULE | Freq: Three times a day (TID) | ORAL | Status: DC
  Administered 2019-04-10 – 2019-04-11 (×5): 300 mg via ORAL
  Filled 2019-04-09 (×5): qty 1

## 2019-04-09 MED ORDER — LACTATED RINGERS IV SOLN
INTRAVENOUS | Status: DC | PRN
  Administered 2019-04-09: 14:00:00 via INTRAVENOUS

## 2019-04-09 MED ORDER — LACTATED RINGERS IV SOLN
INTRAVENOUS | Status: DC
  Administered 2019-04-09: 12:00:00 via INTRAVENOUS

## 2019-04-09 MED ORDER — PANTOPRAZOLE SODIUM 40 MG PO TBEC
40.0000 mg | DELAYED_RELEASE_TABLET | Freq: Every day | ORAL | Status: DC
  Administered 2019-04-10 – 2019-04-11 (×2): 40 mg via ORAL
  Filled 2019-04-09 (×2): qty 1

## 2019-04-09 MED ORDER — HYDROMORPHONE HCL 1 MG/ML IJ SOLN
INTRAMUSCULAR | Status: AC
  Administered 2019-04-09: 22:00:00 0.5 mg via INTRAVENOUS
  Filled 2019-04-09: qty 1

## 2019-04-09 MED ORDER — PROPOFOL 10 MG/ML IV BOLUS
INTRAVENOUS | Status: AC
  Filled 2019-04-09: qty 20

## 2019-04-09 MED ORDER — OXYCODONE HCL 5 MG PO TABS: 5.0000 mg | ORAL_TABLET | ORAL | Status: DC | PRN

## 2019-04-09 MED ORDER — SODIUM CHLORIDE 0.9 % IR SOLN
Status: DC | PRN
  Administered 2019-04-09: 1000 mL

## 2019-04-09 MED ORDER — FENTANYL CITRATE (PF) 100 MCG/2ML IJ SOLN
INTRAMUSCULAR | Status: DC | PRN
  Administered 2019-04-09 (×5): 50 ug via INTRAVENOUS

## 2019-04-09 MED ORDER — THROMBIN 5000 UNITS EX SOLR
CUTANEOUS | Status: DC | PRN
  Administered 2019-04-09: 5000 [IU] via TOPICAL

## 2019-04-09 MED ORDER — SUCCINYLCHOLINE CHLORIDE 20 MG/ML IJ SOLN
INTRAMUSCULAR | Status: DC | PRN
  Administered 2019-04-09: 100 mg via INTRAVENOUS

## 2019-04-09 MED ORDER — ONDANSETRON HCL 4 MG/2ML IJ SOLN: 4.0000 mg | Freq: Four times a day (QID) | INTRAMUSCULAR | Status: DC | PRN

## 2019-04-09 MED ORDER — FENTANYL CITRATE (PF) 100 MCG/2ML IJ SOLN
50.0000 ug | Freq: Once | INTRAMUSCULAR | Status: AC
  Administered 2019-04-09: 50 ug via INTRAVENOUS

## 2019-04-09 MED ORDER — ONDANSETRON HCL 4 MG/2ML IJ SOLN: 4.0000 mg | Freq: Once | INTRAMUSCULAR | Status: DC | PRN

## 2019-04-09 MED ORDER — LAMOTRIGINE 25 MG PO TABS
200.0000 mg | ORAL_TABLET | Freq: Every day | ORAL | Status: DC
  Administered 2019-04-10: 200 mg via ORAL
  Filled 2019-04-09: qty 8

## 2019-04-09 MED ORDER — ACETAMINOPHEN 10 MG/ML IV SOLN
INTRAVENOUS | Status: DC | PRN
  Administered 2019-04-09: 1000 mg via INTRAVENOUS

## 2019-04-09 MED ORDER — MIDAZOLAM HCL 2 MG/2ML IJ SOLN
INTRAMUSCULAR | Status: DC | PRN
  Administered 2019-04-09: 2 mg via INTRAVENOUS

## 2019-04-09 MED ORDER — SUGAMMADEX SODIUM 200 MG/2ML IV SOLN
INTRAVENOUS | Status: AC
  Filled 2019-04-09: qty 2

## 2019-04-09 MED ORDER — LIDOCAINE HCL (CARDIAC) PF 100 MG/5ML IV SOSY
PREFILLED_SYRINGE | INTRAVENOUS | Status: DC | PRN
  Administered 2019-04-09: 60 mg via INTRAVENOUS

## 2019-04-09 MED ORDER — DEXAMETHASONE SODIUM PHOSPHATE 4 MG/ML IJ SOLN
INTRAMUSCULAR | Status: AC
  Administered 2019-04-09: 4 mg
  Filled 2019-04-09: qty 1

## 2019-04-09 MED ORDER — FENTANYL CITRATE (PF) 250 MCG/5ML IJ SOLN
INTRAMUSCULAR | Status: AC
  Filled 2019-04-09: qty 5

## 2019-04-09 MED ORDER — HYDROMORPHONE HCL 1 MG/ML IJ SOLN: 0.5000 mg | INTRAMUSCULAR | Status: DC | PRN

## 2019-04-09 MED ORDER — VALACYCLOVIR HCL 500 MG PO TABS
2000.0000 mg | ORAL_TABLET | Freq: Two times a day (BID) | ORAL | Status: DC | PRN
  Filled 2019-04-09: qty 4

## 2019-04-09 MED ORDER — BUPIVACAINE HCL (PF) 0.5 % IJ SOLN
INTRAMUSCULAR | Status: AC
  Filled 2019-04-09: qty 30

## 2019-04-09 MED ORDER — BUPIVACAINE-EPINEPHRINE (PF) 0.5% -1:200000 IJ SOLN
INTRAMUSCULAR | Status: DC | PRN
  Administered 2019-04-09: 8 mL

## 2019-04-09 MED ORDER — CEFAZOLIN SODIUM-DEXTROSE 2-4 GM/100ML-% IV SOLN
2.0000 g | Freq: Once | INTRAVENOUS | Status: AC
  Administered 2019-04-09: 2 g via INTRAVENOUS

## 2019-04-09 MED ORDER — PHENOL 1.4 % MT LIQD
1.0000 | OROMUCOSAL | Status: DC | PRN
  Filled 2019-04-09: qty 177

## 2019-04-09 MED ORDER — ONDANSETRON HCL 4 MG PO TABS: 4.0000 mg | ORAL_TABLET | Freq: Four times a day (QID) | ORAL | Status: DC | PRN

## 2019-04-09 MED ORDER — SODIUM CHLORIDE 0.9 % IV SOLN
INTRAVENOUS | Status: DC | PRN
  Administered 2019-04-09: .08 ug/kg/min via INTRAVENOUS

## 2019-04-09 MED ORDER — DEXAMETHASONE SODIUM PHOSPHATE 10 MG/ML IJ SOLN
INTRAMUSCULAR | Status: DC | PRN
  Administered 2019-04-09: 10 mg via INTRAVENOUS

## 2019-04-09 MED ORDER — BISACODYL 5 MG PO TBEC: 5.0000 mg | DELAYED_RELEASE_TABLET | Freq: Every day | ORAL | Status: DC | PRN

## 2019-04-09 MED ORDER — ROCURONIUM BROMIDE 100 MG/10ML IV SOLN
INTRAVENOUS | Status: DC | PRN
  Administered 2019-04-09: 5 mg via INTRAVENOUS

## 2019-04-09 MED ORDER — IPRATROPIUM-ALBUTEROL 0.5-2.5 (3) MG/3ML IN SOLN: 3.0000 mL | RESPIRATORY_TRACT | Status: DC | PRN

## 2019-04-09 MED ORDER — HYDROCHLOROTHIAZIDE 12.5 MG PO CAPS
12.5000 mg | ORAL_CAPSULE | Freq: Every day | ORAL | Status: DC
  Administered 2019-04-10: 12.5 mg via ORAL
  Filled 2019-04-09: qty 1

## 2019-04-09 MED ORDER — OXYCODONE HCL 5 MG PO TABS
10.0000 mg | ORAL_TABLET | ORAL | Status: DC | PRN
  Administered 2019-04-10 – 2019-04-11 (×7): 10 mg via ORAL
  Filled 2019-04-09 (×7): qty 2

## 2019-04-09 MED ORDER — MAGNESIUM CITRATE PO SOLN
1.0000 | Freq: Once | ORAL | Status: DC | PRN
  Filled 2019-04-09: qty 296

## 2019-04-09 MED ORDER — DEXAMETHASONE SODIUM PHOSPHATE 10 MG/ML IJ SOLN
4.0000 mg | Freq: Three times a day (TID) | INTRAMUSCULAR | Status: DC
  Administered 2019-04-10: 4 mg via INTRAVENOUS
  Filled 2019-04-09 (×3): qty 0.4

## 2019-04-09 SURGICAL SUPPLY — 67 items
BULB RESERV EVAC DRAIN JP 100C (MISCELLANEOUS) ×3 IMPLANT
BUR NEURO DRILL SOFT 3.0X3.8M (BURR) ×3 IMPLANT
CANISTER SUCT 1200ML W/VALVE (MISCELLANEOUS) ×6 IMPLANT
CHLORAPREP W/TINT 26 (MISCELLANEOUS) ×6 IMPLANT
COUNTER NEEDLE 20/40 LG (NEEDLE) ×3 IMPLANT
COVER LIGHT HANDLE STERIS (MISCELLANEOUS) ×6 IMPLANT
COVER WAND RF STERILE (DRAPES) ×3 IMPLANT
CRADLE LAMINECT ARM (MISCELLANEOUS) ×3 IMPLANT
DERMABOND ADVANCED (GAUZE/BANDAGES/DRESSINGS) ×2
DERMABOND ADVANCED .7 DNX12 (GAUZE/BANDAGES/DRESSINGS) ×1 IMPLANT
DRAIN CHANNEL JP 10F RND 20C F (MISCELLANEOUS) ×3 IMPLANT
DRAPE C-ARM 42X72 X-RAY (DRAPES) ×6 IMPLANT
DRAPE LAPAROTOMY 77X122 PED (DRAPES) ×3 IMPLANT
DRAPE MICROSCOPE SPINE 48X150 (DRAPES) ×3 IMPLANT
DRAPE POUCH INSTRU U-SHP 10X18 (DRAPES) ×3 IMPLANT
DRAPE SURG 17X11 SM STRL (DRAPES) ×12 IMPLANT
DRSG TEGADERM 2-3/8X2-3/4 SM (GAUZE/BANDAGES/DRESSINGS) ×3 IMPLANT
DRSG TEGADERM 4X4.75 (GAUZE/BANDAGES/DRESSINGS) ×3 IMPLANT
DRSG TELFA 3X8 NADH (GAUZE/BANDAGES/DRESSINGS) ×3 IMPLANT
ELECT CAUTERY BLADE TIP 2.5 (TIP) ×3
ELECT REM PT RETURN 9FT ADLT (ELECTROSURGICAL) ×3
ELECTRODE CAUTERY BLDE TIP 2.5 (TIP) ×1 IMPLANT
ELECTRODE REM PT RTRN 9FT ADLT (ELECTROSURGICAL) ×1 IMPLANT
FEE INTRAOP MONITOR IMPULS NCS (MISCELLANEOUS) ×1 IMPLANT
FRAME EYE SHIELD (PROTECTIVE WEAR) ×6 IMPLANT
GLOVE BIOGEL PI IND STRL 7.0 (GLOVE) ×1 IMPLANT
GLOVE BIOGEL PI INDICATOR 7.0 (GLOVE) ×2
GLOVE SURG SYN 7.0 (GLOVE) ×6 IMPLANT
GLOVE SURG SYN 8.5  E (GLOVE) ×6
GLOVE SURG SYN 8.5 E (GLOVE) ×3 IMPLANT
GOWN SRG XL LVL 3 NONREINFORCE (GOWNS) ×1 IMPLANT
GOWN STRL NON-REIN TWL XL LVL3 (GOWNS) ×2
GOWN STRL REUS W/TWL MED LVL3 (GOWN DISPOSABLE) ×3 IMPLANT
GRADUATE 1200CC STRL 31836 (MISCELLANEOUS) ×3 IMPLANT
INTRAOP MONITOR FEE IMPULS NCS (MISCELLANEOUS) ×1
INTRAOP MONITOR FEE IMPULSE (MISCELLANEOUS) ×2
IV CATH ANGIO 12GX3 LT BLUE (NEEDLE) ×3 IMPLANT
KIT TURNOVER KIT A (KITS) ×3 IMPLANT
MARKER SKIN DUAL TIP RULER LAB (MISCELLANEOUS) ×6 IMPLANT
NEEDLE HYPO 22GX1.5 SAFETY (NEEDLE) ×3 IMPLANT
NS IRRIG 1000ML POUR BTL (IV SOLUTION) ×3 IMPLANT
PACK LAMINECTOMY NEURO (CUSTOM PROCEDURE TRAY) ×3 IMPLANT
PIN CASPAR 14 (PIN) ×1 IMPLANT
PIN CASPAR 14MM (PIN) ×3
PLATE ANT CERV XTEND 4 LV 63 (Plate) ×3 IMPLANT
PUTTY DBX 5CC (Putty) ×3 IMPLANT
SCREW VAR 4.2 XD SELF DRILL 14 (Screw) ×3 IMPLANT
SCREW VAR 4.2 XD SELF DRILL 16 (Screw) ×3 IMPLANT
SCREW VAR SELF TAP 3.6MM 14MM (Screw) ×3 IMPLANT
SPACER C HEDRON 12X14 6 7D (Spacer) ×12 IMPLANT
SPACER COALITION 12X14 7D 6MM (Spine Construct) ×3 IMPLANT
SPOGE SURGIFLO 8M (HEMOSTASIS) ×2
SPONGE KITTNER 5P (MISCELLANEOUS) ×6 IMPLANT
SPONGE SURGIFLO 8M (HEMOSTASIS) ×1 IMPLANT
SURGIFLO W/THROMBIN 8M KIT (HEMOSTASIS) ×3 IMPLANT
SUT ETHILON 3-0 FS-10 30 BLK (SUTURE) ×3
SUT SILK 2 0 (SUTURE)
SUT SILK 2-0 18XBRD TIE 12 (SUTURE) IMPLANT
SUT V-LOC 90 ABS DVC 3-0 CL (SUTURE) ×6 IMPLANT
SUT VIC AB 3-0 SH 8-18 (SUTURE) ×3 IMPLANT
SUTURE EHLN 3-0 FS-10 30 BLK (SUTURE) ×1 IMPLANT
SYR 30ML LL (SYRINGE) ×3 IMPLANT
TAPE CLOTH 3X10 WHT NS LF (GAUZE/BANDAGES/DRESSINGS) ×3 IMPLANT
TOWEL OR 17X26 4PK STRL BLUE (TOWEL DISPOSABLE) ×12 IMPLANT
TRAY FOLEY MTR SLVR 16FR STAT (SET/KITS/TRAYS/PACK) IMPLANT
TUBING CONNECTING 10 (TUBING) ×2 IMPLANT
TUBING CONNECTING 10' (TUBING) ×1

## 2019-04-09 NOTE — Anesthesia Preprocedure Evaluation (Addendum)
Anesthesia Evaluation  Patient identified by MRN, date of birth, ID band Patient awake    Reviewed: Allergy & Precautions, H&P , NPO status , Patient's Chart, lab work & pertinent test results  History of Anesthesia Complications Negative for: history of anesthetic complications  Airway Mallampati: III  TM Distance: <3 FB Neck ROM: limited    Dental  (+) Chipped, Poor Dentition   Pulmonary neg pulmonary ROS, neg shortness of breath, asthma , sleep apnea , pneumonia, COPD, former smoker,           Cardiovascular Exercise Tolerance: Good hypertension, (-) angina(-) Past MI and (-) DOE      Neuro/Psych PSYCHIATRIC DISORDERS Depression Bipolar Disorder  Neuromuscular disease    GI/Hepatic Neg liver ROS, GERD  Medicated and Controlled,  Endo/Other  negative endocrine ROS  Renal/GU      Musculoskeletal  (+) Arthritis ,   Abdominal   Peds  Hematology negative hematology ROS (+)   Anesthesia Other Findings Past Medical History: No date: Arthritis     Comment:  hands No date: Asthma No date: Barrett's esophagus No date: Cancer (HCC)     Comment:  neuroendocrine ca No date: COPD (chronic obstructive pulmonary disease) (HCC) No date: Depression No date: GERD (gastroesophageal reflux disease) No date: Hypertension No date: Motion sickness     Comment:  reading in car 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[place]) 2017: Pneumonia No date: Sleep apnea  Past Surgical History: No date: ABDOMINAL HYSTERECTOMY 07/19/2012: BOWEL RESECTION No date: CESAREAN SECTION     Comment:  x3 03/05/2017: ESOPHAGOGASTRODUODENOSCOPY (EGD) WITH PROPOFOL; N/A     Comment:  Procedure: ESOPHAGOGASTRODUODENOSCOPY (EGD) WITH               PROPOFOL;  Surgeon: [name], MD;  Location: [place];  Service: Endoscopy;  Laterality: N/A; 03/30/2017: ESOPHAGOGASTRODUODENOSCOPY (EGD) WITH PROPOFOL; N/A     Comment:  REPEAT  IN 03/2019 No date: HAND SURGERY; Right  BMI    Body Mass Index:  28.01 kg/m      Reproductive/Obstetrics negative OB ROS                             Anesthesia Physical  Anesthesia Plan  ASA: III  Anesthesia Plan: General ETT   Post-op Pain Management:    Induction: Intravenous  PONV Risk Score and Plan:   Airway Management Planned: Oral ETT and Video Laryngoscope Planned  Additional Equipment:   Intra-op Plan:   Post-operative Plan: Extubation in OR  Informed Consent: I have reviewed the patients History and Physical, chart, labs and discussed the procedure including the risks, benefits and alternatives for the proposed anesthesia with the patient or authorized representative who has indicated his/her understanding and acceptance.     Dental Advisory Given  Plan Discussed with: Anesthesiologist, CRNA and Surgeon  Anesthesia Plan Comments: (Patient consented for risks of anesthesia including but not limited to:  - adverse reactions to medications - damage to teeth, lips or other oral mucosa - sore throat or hoarseness - Damage to heart, brain, lungs or loss of life  Patient voiced understanding.)       Anesthesia Quick Evaluation

## 2019-04-09 NOTE — Consult Note (Addendum)
Pharmacy Antibiotic Note  Bailey Flores is a 58 y.o. female admitted on (Not on file) with surgical prophylaxis.  Pharmacy has been consulted for Cefazolin dosing.  Plan: 1. Cefazolin 2g x1 dose to be given 30 minutes prior to procedure.       No data recorded.  No results for input(s): WBC, CREATININE, LATICACIDVEN, VANCOTROUGH, VANCOPEAK, VANCORANDOM, GENTTROUGH, GENTPEAK, GENTRANDOM, TOBRATROUGH, TOBRAPEAK, TOBRARND, AMIKACINPEAK, AMIKACINTROU, AMIKACIN in the last 168 hours.  Estimated Creatinine Clearance: 53.7 mL/min (by C-G formula based on SCr of 0.68 mg/dL).    No Known Allergies  Thank you for allowing pharmacy to be a part of this patient's care.  Rowland Lathe 04/09/2019 12:03 PM

## 2019-04-09 NOTE — Anesthesia Post-op Follow-up Note (Signed)
Anesthesia QCDR form completed.        

## 2019-04-09 NOTE — H&P (Signed)
I have reviewed and confirmed my history and physical from 03/27/2019 with no additions or changes. Plan for ACDF C3-T1.  She is aware that we may be unable to reach C7-T1 and may stop at C7 with plan for interval C7-T1 foraminotomy.  Risks and benefits reviewed.  Heart sounds normal no MRG. Chest Clear to Auscultation Bilaterally.

## 2019-04-09 NOTE — Anesthesia Procedure Notes (Signed)
Procedure Name: Intubation Performed by: Fredderick Phenix, CRNA Pre-anesthesia Checklist: Patient identified, Emergency Drugs available, Suction available and Patient being monitored Patient Re-evaluated:Patient Re-evaluated prior to induction Oxygen Delivery Method: Circle system utilized Preoxygenation: Pre-oxygenation with 100% oxygen Induction Type: IV induction Ventilation: Mask ventilation without difficulty Laryngoscope Size: McGraph Grade View: Grade I Tube type: Oral Tube size: 7.0 mm Number of attempts: 1 Airway Equipment and Method: Stylet and Oral airway Placement Confirmation: ETT inserted through vocal cords under direct vision,  positive ETCO2 and breath sounds checked- equal and bilateral Secured at: 7 cm Tube secured with: Tape Dental Injury: Teeth and Oropharynx as per pre-operative assessment

## 2019-04-09 NOTE — Telephone Encounter (Signed)
Spoke to pt and relayed below message.  Pt does not wish to proceed with oxygen at this time and would like to repeat SMW in 25mo. Recall has been placed for 61mo. Nothing further is needed at this time.

## 2019-04-09 NOTE — Op Note (Signed)
Indications: Bailey Flores is suffering from cervical myelopathy. she failed conservative management, and elected to proceed with surgery.  Findings: severe cervical stenosis  Preoperative Diagnosis: Cervical myelopathy G95.9 Postoperative Diagnosis: same   EBL: 200 ml IVF: 1100 ml Drains: 1 placed Disposition: Extubated and Stable to PACU Complications: none  A foley catheter was placed.   Preoperative Note:   Risks of surgery discussed include: infection, bleeding, stroke, coma, death, paralysis, CSF leak, nerve/spinal cord injury, numbness, tingling, weakness, complex regional pain syndrome, recurrent stenosis and/or disc herniation, vascular injury, development of instability, neck/back pain, need for further surgery, persistent symptoms, development of deformity, and the risks of anesthesia. The patient understood these risks and agreed to proceed.  Operative Note:   Procedure: 1. Anterior Cervical Discectomy C3-4 including bilateral foraminotomies and end plate preparation  2. Anterior Cervical Discectomy C4-5 including bilateral foraminotomies and end plate preparation  3. Anterior Cervical Discectomy C5-6 including bilateral foraminotomies and end plate preparation  4. Anterior Cervical Discectomy C6-7 including bilateral foraminotomies and end plate preparation 5. Anterior Cervical Discectomy C7-T1 including bilateral foraminotomies and end plate preparation 6. Anterior Spinal Instrumentation C3 to 7 using Globus XTend  7. Anterior arthrodesis from C3 to T1 with placement of biomechanical devices at C3-4, C4-5, C5-6, C6-7, and C7-T1 8. Use of the operative microscope 9. Use of intraoperative flouroscopy  PROCEDURE IN DETAIL: After obtaining informed consent, the patient taken to the operating room, placed in supine position, general anesthesia induced.  The patient had a small shoulder roll placed behind their shoulders.  The patient received preop antibiotics and  IV Decadron.  The patient had 2 neck incisions outlined, was prepped and draped in usual sterile fashion. The incisions wereinjected with local anesthetic.   The superior incision was opened, dissection taken down medial to the carotid artery and jugular vein, lateral to the trachea and esophagus.  The prevertebral fascia identified and a localizing x-ray demonstrated the correct level.  The longus colli were dissected laterally, and self-retaining retractors placed to open the operative field. The microscope was then brought into the field. The inferior incision was also opened in similar fashion to provide exposure from C3 to T1.    Caspar pins were placed into the vertebral bodies at C3 and C5. The distractor was then placed at C3-5, and the annuli at C3-4 and C4-5 were opened using a bovie.  Curettes and pituitary rongeurs used to remove the majority of disk, then the drill was used to remove the posterior osteophyte and begin the foraminotomies. The nerve hook was used to elevate the posterior longitudinal ligament, which was then removed with Kerrison rongeurs. The microblunt nerve hook could be passed out the foramen bilaterally at each level.   Meticulous hemostasis was obtained. A biomechanical device (Globus Hedron 6 mm height x 14 mm width by 12 mm depth) was placed at C3-4. A second biomechanical device (Globus Hedron 6 mm height x 14 mm width by 12 mm depth) was placed at C4-5. Each device had been filled with allograft for aid in arthrodesis.   The distractor was removed, and the [place] retractor repositioned. The pin at C3 was removed and bone wax was used for hemostasis. An additional pin was placed at C7.  The distractor was placed from C5-7, and the annuli at C5-6 and C6-7 were opened using a bovie.  Curettes and pituitary rongeurs used to remove the majority of disk, then the drill was used to remove the posterior osteophyte and begin the  foraminotomies. The nerve hook was used to  elevate the posterior longitudinal ligament, which was then removed with Kerrison rongeurs. The microblunt nerve hook could be passed out the foramen bilaterally at each level.   Meticulous hemostasis was obtained.  Prior to placing mechanical devices, we moved to C7-T1. The C5 caspar pin was moved to T1, and the C7-T1 disc space was distracted and opened with a bovie. Curettes and pituitary rongeurs used to remove the majority of disk, then the drill was used to remove the posterior osteophyte and begin the foraminotomies. The nerve hook was used to elevate the posterior longitudinal ligament, which was then removed with Kerrison rongeurs. The microblunt nerve hook could be passed out the foramen bilaterally at each level.   Meticulous hemostasis was obtained. A biomechanical device (Globus Coalition 45mm height with integrated plate) was inserted after filling with demineralized bone matrix.  A 14 mm screw was placed into C7 and into T1 and final tightened.  The T1 caspar pin was then removed and positioned at C5.  The C5-7 spaces were then distracted once more.   A biomechanical device (Globus Hedron 6 mm height x 14 mm width by 12 mm depth)was placed at C5-6. A second biomechanical device (Globus Hedron 6 mm height x 14 mm width by 12 mm depth) was placed at C6-7. Each device had been filled with allograft for aid in arthrodesis.  The caspar distractor was removed and the pins at C5 and C7 were removed.  Bone wax was used for hemostasis.    A five segment, four level plate (63 mm Globus Xtend) was chosen and placed from C3 to C7.  Two screws placed in the vertebral bodies of all four segments, respectively making sure the screws were behind the locking mechanism.  Final AP and lateral radiographs were taken.   With everything in good position, the wound was irrigated copiously with bacitracin-containing solution and meticulous hemostasis obtained.  A drain was placed. Wound was closed in 2 layers using  interrupted inverted 3-0 Vicryl sutures in the platysma and 3-0 monocryl in the skin.  The wound was dressed with dermabond, the head of bed at 30 degrees, taken to recovery room in stable condition.  No new postop neurological deficits were identified.  All counts were correct at the end of the case.   Neuromonitoring was used throughout with no changes.   I performed the entire procedure with the assistance of [name] PA as an Pensions consultant.  Meade Maw MD

## 2019-04-10 ENCOUNTER — Other Ambulatory Visit: Payer: Self-pay

## 2019-04-10 ENCOUNTER — Observation Stay

## 2019-04-10 ENCOUNTER — Encounter: Payer: Self-pay | Admitting: Neurosurgery

## 2019-04-10 DIAGNOSIS — Z8249 Family history of ischemic heart disease and other diseases of the circulatory system: Secondary | ICD-10-CM | POA: Diagnosis not present

## 2019-04-10 DIAGNOSIS — G3184 Mild cognitive impairment, so stated: Secondary | ICD-10-CM | POA: Diagnosis present

## 2019-04-10 DIAGNOSIS — F419 Anxiety disorder, unspecified: Secondary | ICD-10-CM | POA: Diagnosis present

## 2019-04-10 DIAGNOSIS — M4712 Other spondylosis with myelopathy, cervical region: Secondary | ICD-10-CM | POA: Diagnosis present

## 2019-04-10 DIAGNOSIS — K0889 Other specified disorders of teeth and supporting structures: Secondary | ICD-10-CM | POA: Diagnosis present

## 2019-04-10 DIAGNOSIS — M4802 Spinal stenosis, cervical region: Secondary | ICD-10-CM | POA: Diagnosis present

## 2019-04-10 DIAGNOSIS — Z8349 Family history of other endocrine, nutritional and metabolic diseases: Secondary | ICD-10-CM | POA: Diagnosis not present

## 2019-04-10 DIAGNOSIS — I10 Essential (primary) hypertension: Secondary | ICD-10-CM | POA: Diagnosis present

## 2019-04-10 DIAGNOSIS — Z825 Family history of asthma and other chronic lower respiratory diseases: Secondary | ICD-10-CM | POA: Diagnosis not present

## 2019-04-10 DIAGNOSIS — Z981 Arthrodesis status: Secondary | ICD-10-CM | POA: Diagnosis not present

## 2019-04-10 DIAGNOSIS — F319 Bipolar disorder, unspecified: Secondary | ICD-10-CM | POA: Diagnosis present

## 2019-04-10 DIAGNOSIS — Z818 Family history of other mental and behavioral disorders: Secondary | ICD-10-CM | POA: Diagnosis not present

## 2019-04-10 DIAGNOSIS — F902 Attention-deficit hyperactivity disorder, combined type: Secondary | ICD-10-CM | POA: Diagnosis present

## 2019-04-10 DIAGNOSIS — Z9049 Acquired absence of other specified parts of digestive tract: Secondary | ICD-10-CM | POA: Diagnosis not present

## 2019-04-10 DIAGNOSIS — Z8589 Personal history of malignant neoplasm of other organs and systems: Secondary | ICD-10-CM | POA: Diagnosis not present

## 2019-04-10 DIAGNOSIS — Z87891 Personal history of nicotine dependence: Secondary | ICD-10-CM | POA: Diagnosis not present

## 2019-04-10 DIAGNOSIS — M4312 Spondylolisthesis, cervical region: Secondary | ICD-10-CM | POA: Diagnosis present

## 2019-04-10 DIAGNOSIS — Z8261 Family history of arthritis: Secondary | ICD-10-CM | POA: Diagnosis not present

## 2019-04-10 DIAGNOSIS — K219 Gastro-esophageal reflux disease without esophagitis: Secondary | ICD-10-CM | POA: Diagnosis present

## 2019-04-10 DIAGNOSIS — M2578 Osteophyte, vertebrae: Secondary | ICD-10-CM | POA: Diagnosis present

## 2019-04-10 DIAGNOSIS — Z90710 Acquired absence of both cervix and uterus: Secondary | ICD-10-CM | POA: Diagnosis not present

## 2019-04-10 DIAGNOSIS — M1990 Unspecified osteoarthritis, unspecified site: Secondary | ICD-10-CM | POA: Diagnosis present

## 2019-04-10 DIAGNOSIS — G4730 Sleep apnea, unspecified: Secondary | ICD-10-CM | POA: Diagnosis present

## 2019-04-10 DIAGNOSIS — J439 Emphysema, unspecified: Secondary | ICD-10-CM | POA: Diagnosis present

## 2019-04-10 MED ORDER — METHOCARBAMOL 1000 MG/10ML IJ SOLN
750.0000 mg | Freq: Four times a day (QID) | INTRAVENOUS | Status: DC
  Filled 2019-04-10: qty 7.5

## 2019-04-10 MED ORDER — KETOROLAC TROMETHAMINE 15 MG/ML IJ SOLN
15.0000 mg | Freq: Three times a day (TID) | INTRAMUSCULAR | Status: DC
  Administered 2019-04-10 – 2019-04-11 (×2): 15 mg via INTRAVENOUS
  Filled 2019-04-10 (×2): qty 1

## 2019-04-10 MED ORDER — KETOROLAC TROMETHAMINE 30 MG/ML IJ SOLN
30.0000 mg | Freq: Once | INTRAMUSCULAR | Status: AC
  Administered 2019-04-10: 30 mg via INTRAVENOUS
  Filled 2019-04-10: qty 1

## 2019-04-10 MED ORDER — CHLORHEXIDINE GLUCONATE CLOTH 2 % EX PADS: 6.0000 | MEDICATED_PAD | Freq: Every day | CUTANEOUS | Status: DC

## 2019-04-10 MED ORDER — DIAZEPAM 5 MG PO TABS
5.0000 mg | ORAL_TABLET | Freq: Once | ORAL | Status: AC
  Administered 2019-04-10: 5 mg via ORAL
  Filled 2019-04-10: qty 1

## 2019-04-10 MED ORDER — METHOCARBAMOL 500 MG PO TABS
1000.0000 mg | ORAL_TABLET | Freq: Four times a day (QID) | ORAL | Status: DC
  Administered 2019-04-10 – 2019-04-11 (×4): 1000 mg via ORAL
  Filled 2019-04-10 (×4): qty 2

## 2019-04-10 NOTE — Evaluation (Signed)
Occupational Therapy Evaluation Patient Details Name: Bailey Flores MRN: [id number] DOB: [birth year] Today's Date: 04/10/2019    History of Present Illness Pt is a 58 yo female diagnosed with cervical myelopathy and is s/p C3-T1 ACDF.   PMH includes lumbar fusion, COPD, emphasema, DOE, HTN, and neuroendocrine CA.   Clinical Impression   Pt is 58 year old female s/p C3-T1 ACDF.  Pt was independent in all ADLs prior to surgery with occasional assist from her husband for shoe and socks and is eager to return to PLOF.  Pt currently requires min assist for LB dressing while in seated position due to pain and limited trunk flexion due to cervical brace in place. Pain level was 6/10 throughout session but cooperative and motivated to regain independence in ADLs.  Pt has full sensation in BUEs and hands with hx of tremors and arthritis and education in rec weighted utensils, long straw and rocker knife and AD given with names and prices. Full MMT for shoulders deferred due to surgery and grasp in B hands 4-/5.  Reviewed rec for AD for LB dressing and completed with min assist and cues and reviewed precautions when transferring to and from toilet and couch or chair at home.   Pt would benefit from instruction in dressing techniques with assistive devices for dressing and bathing skills.  Pt's husband present for last part of eval and has back surgery and also uses and needs AD.  Will assess for OT [facility] needs as pt progresses in therapy but most likely will not need any further OT after discharge.     Follow Up Recommendations  No OT follow up    Equipment Recommendations  Other (comment)(sock aid, elastic shoe laces)    Recommendations for Other Services       Precautions / Restrictions Precautions Precautions: Cervical;Fall Precaution Comments: No bending, arching, or twisting; cervical collar may be off in bed, pt may amb to the BR without the collar, don/doff in sitting, may remove collar to  shower Required Braces or Orthoses: Cervical Brace Cervical Brace: Other (comment) Restrictions Weight Bearing Restrictions: No      Mobility Bed Mobility Overal bed mobility: Modified Independent             General bed mobility comments: extra time and effort but no physical assistance needed  Transfers Overall transfer level: Needs assistance Equipment used: Rolling walker (2 wheeled);None Transfers: Sit to/from Stand Sit to Stand: Supervision         General transfer comment: Good eccentric and concentric control    Balance Overall balance assessment: Needs assistance   Sitting balance-Leahy Scale: Normal     Standing balance support: No upper extremity supported;During functional activity Standing balance-Leahy Scale: Fair                             ADL either performed or assessed with clinical judgement   ADL Overall ADL's : Needs assistance/impaired Eating/Feeding: Independent;Set up;Sitting Eating/Feeding Details (indicate cue type and reason): rec long straws and weighted utensils to help with tremors as well as rocker knife to cut meat. Grooming: Wash/dry hands;Wash/dry face;Oral care;Brushing hair;Set up;Sitting;Independent   Upper Body Bathing: Minimal assistance;Set up   Lower Body Bathing: Minimal assistance;Set up;With adaptive equipment   Upper Body Dressing : Minimal assistance;Set up;Sitting   Lower Body Dressing: Minimal assistance;Set up;With adaptive equipment Lower Body Dressing Details (indicate cue type and reason): using reacher and sock aid  Functional mobility during ADLs: Rolling walker;Min guard       Vision Patient Visual Report: No change from baseline       Perception     Praxis      Pertinent Vitals/Pain Pain Assessment: 0-10 Pain Score: 6  Pain Location: neck Pain Descriptors / Indicators: Sore;Aching Pain Intervention(s): Monitored during session;Repositioned;Premedicated before  session     Hand Dominance Right   Extremity/Trunk Assessment Upper Extremity Assessment Upper Extremity Assessment: Generalized weakness;RUE deficits/detail;LUE deficits/detail RUE Deficits / Details: termors in B hands very mild compared to baseline per patient and has arthritis and drops items frequently.  Strength 4-/5 in hands---UE deferred due to recent surgery. RUE Sensation: WNL RUE Coordination: decreased fine motor LUE Deficits / Details: termors in B hands very mild compared to baseline per patient and has arthritis and drops items frequently.  Strength 4-/5 in hands---UE deferred due to recent surgery. LUE Sensation: WNL LUE Coordination: decreased fine motor   Lower Extremity Assessment Lower Extremity Assessment: RLE deficits/detail;LLE deficits/detail RLE Deficits / Details: RLE strength grossly 4/5 RLE Sensation: WNL RLE Coordination: WNL LLE Deficits / Details: LLE strength grossly 4/5; medial calf with decreased sensation to light touch compared to the RLE but chronic per patient LLE Sensation: decreased light touch       Communication Communication Communication: No difficulties   Cognition Arousal/Alertness: Awake/alert Behavior During Therapy: WFL for tasks assessed/performed Overall Cognitive Status: Within Functional Limits for tasks assessed                                     General Comments       Exercises Total Joint Exercises Ankle Circles/Pumps: Strengthening;Both;10 reps Quad Sets: Strengthening;Both;10 reps Gluteal Sets: Strengthening;Both;10 reps Towel Squeeze: Strengthening;Both;10 reps Heel Slides: AROM;Both;5 reps Hip ABduction/ADduction: AROM;Both;Strengthening;10 reps Long Arc Quad: Strengthening;Both;10 reps Knee Flexion: Strengthening;Both;10 reps Marching in Standing: AROM;Both;10 reps;Standing Other Exercises Other Exercises: Cervical precaution education and review during functional tasks   Shoulder  Instructions      Home Living Family/patient expects to be discharged to:: Private residence Living Arrangements: Spouse/significant other Available Help at Discharge: Family;Available 24 hours/day Type of Home: House Home Access: Stairs to enter [employer] of Steps: 3 Entrance Stairs-Rails: Right;Left;Can reach both Home Layout: One level     Bathroom Shower/Tub: Occupational psychologist: Standard Bathroom Accessibility: Yes How Accessible: Accessible via walker Home Equipment: [place] - 4 wheels;Cane - quad;Cane - single point;Shower seat;Adaptive equipment Adaptive Equipment: Reacher;Long-handled shoe horn        Prior Functioning/Environment Level of Independence: Needs assistance  Gait / Transfers Assistance Needed: Ind amb without an AD community distances, no fall history ADL's / Homemaking Assistance Needed: Spouse assists as needed with bathing, dressing, and grooming            OT Problem List: Pain;Decreased activity tolerance;Decreased knowledge of use of DME or AE      OT Treatment/Interventions: Self-care/ADL training;DME and/or AE instruction;Patient/family education    OT Goals(Current goals can be found in the care plan section) Acute Rehab OT Goals Patient Stated Goal: to be independent again with no pain OT Goal Formulation: With patient/family Time For Goal Achievement: 04/24/19 Potential to Achieve Goals: Good ADL Goals Pt Will Perform Upper Body Dressing: Independently;sitting Pt Will Perform Lower Body Dressing: with set-up;with adaptive equipment;Independently;sit to/from stand Pt Will Transfer to Toilet: Independently;with set-up;regular height toilet;grab bars  OT Frequency: Min  1X/week   Barriers to D/C:            Co-evaluation              AM-PAC OT "6 Clicks" Daily Activity     Outcome Measure Help from another person eating meals?: A Little Help from another person taking care of personal grooming?:  None Help from another person toileting, which includes using toliet, bedpan, or urinal?: A Little Help from another person bathing (including washing, rinsing, drying)?: A Little Help from another person to put on and taking off regular upper body clothing?: A Little Help from another person to put on and taking off regular lower body clothing?: A Little 6 Click Score: 19   End of Session Equipment Utilized During Treatment: Gait belt  Activity Tolerance: Patient tolerated treatment well Patient left: in chair;with call bell/phone within reach;with chair alarm set;with family/visitor present  OT Visit Diagnosis: Pain;Muscle weakness (generalized) (M62.81);Other abnormalities of gait and mobility (R26.89) Pain - Right/Left: (bilateral) Pain - part of body: (neck and upper shoulders)                Time: [id number] OT Time Calculation (min): 38 min Charges:  OT General Charges $OT Visit: 1 Visit OT Evaluation $OT Eval Low Complexity: 1 Low OT Treatments $Self Care/Home Management : 23-37 mins  Chrys Racer, OTR/L, [place] ascom [phone] 04/10/19, 1:23 PM

## 2019-04-10 NOTE — Progress Notes (Signed)
Procedure: C3-T1 ACDF Procedure date: 04/09/2019 Diagnosis: Cervical radiculopathy, myelopathy   History: Bailey Flores is s/p C3-T1 ACDF  POD 1: Patient is recovering well.  Complains of 6/10 posterior neck pain.  Swallowing without difficulty. Drain output 120  POD0: Tolerated procedure well. Evaluated in post op recovery still disoriented from anesthesia.  Planes of posterior neck pain Physical Exam: Vitals:   04/10/19 0307 04/10/19 0431  BP: (!) 150/78 139/80  Pulse: 88 80  Resp: 17 17  Temp: 98.3 F (36.8 C) 98.5 F (36.9 C)  SpO2: 98% 97%    General: Alert and oriented   Data:  No results for input(s): NA, K, CL, CO2, BUN, CREATININE, LABGLOM, GLUCOSE, CALCIUM in the last 168 hours. No results for input(s): AST, ALT, ALKPHOS in the last 168 hours.  Invalid input(s): TBILI   No results for input(s): WBC, HGB, HCT, PLT in the last 168 hours. No results for input(s): APTT, INR in the last 168 hours.       Other tests/results:  EXAM: CERVICAL SPINE - 2-3 VIEW 04/10/2019  IMPRESSION: Status post surgical anterior fusion at multiple levels as described Above.  Assessment/Plan:  Bailey Flores is POD0 s/p C3-T1 ACDF. Will continue to monitor  - monitor drain output - mobilize - pain control -DC dexamethasone, change to Toradol.  Increase muscle relaxer to 1000 mg every 6 hours. - DVT prophylaxis - PTOT - Brace - Imaging  [name] PA-C Department of Neurosurgery

## 2019-04-10 NOTE — Progress Notes (Signed)
Physical Therapy Treatment Patient Details Name: Bailey Flores MRN: [id number] DOB: 08-Mar-1961 Today's Date: 04/10/2019    History of Present Illness Pt is a 58 yo female diagnosed with cervical myelopathy and is s/p C3-T1 ACDF.   PMH includes lumbar fusion, COPD, emphasema, DOE, HTN, and neuroendocrine CA.    PT Comments    Pt presented with minor deficits in strength, transfers, gait, balance, and activity tolerance but is performed well overall during the session with no c/o increased neck pain with activity.  At one point near the end of the session the patient stated "I really thought it was going to be worse than this".  Pt presented with min instability during amb but was able to self-correct.  Pt's SpO2 on room air after amb was 92-93% with HR in the low 100s with no c/o adverse symptoms.  Pt will benefit from continued skilled PT services per MD recommendations upon discharge to safely address above deficits for decreased caregiver assistance and eventual return to PLOF.     Follow Up Recommendations  Follow surgeon's recommendation for DC plan and follow-up therapies     Equipment Recommendations  None recommended by PT    Recommendations for Other Services       Precautions / Restrictions Precautions Precautions: Cervical;Fall Precaution Comments: No bending, arching, or twisting; cervical collar may be off in bed, pt may amb to the BR without the collar, don/doff in sitting, may remove collar to shower Required Braces or Orthoses: Cervical Brace Cervical Brace: Other (comment) Restrictions Weight Bearing Restrictions: No    Mobility  Bed Mobility Overal bed mobility: Modified Independent             General bed mobility comments: NT, pt in recliner  Transfers Overall transfer level: Needs assistance Equipment used: None Transfers: Sit to/from Stand Sit to Stand: Supervision         General transfer comment: Good eccentric and concentric  control  Ambulation/Gait Ambulation/Gait assistance: Min guard Gait Distance (Feet): 200 Feet Assistive device: None Gait Pattern/deviations: Step-through pattern;Decreased step length - right;Decreased step length - left Gait velocity: decreased   General Gait Details: Pt ambulated without an AD with min drifting left/right that the pt reports is baseline for her   Stairs Stairs: Yes Stairs assistance: Min guard Stair Management: Two rails Number of Stairs: 4 General stair comments: Good control and stability ascending and descending stairs with B rails   Wheelchair Mobility    Modified Rankin (Stroke Patients Only)       Balance Overall balance assessment: Needs assistance   Sitting balance-Leahy Scale: Normal     Standing balance support: No upper extremity supported;During functional activity Standing balance-Leahy Scale: Fair                              Cognition Arousal/Alertness: Awake/alert Behavior During Therapy: WFL for tasks assessed/performed Overall Cognitive Status: Within Functional Limits for tasks assessed                                        Exercises Total Joint Exercises Ankle Circles/Pumps: Strengthening;Both;10 reps Quad Sets: Strengthening;Both;10 reps Gluteal Sets: Strengthening;Both;10 reps Towel Squeeze: Strengthening;Both;10 reps Heel Slides: AROM;Both;5 reps Hip ABduction/ADduction: AROM;Both;Strengthening;10 reps Long Arc Quad: Strengthening;Both;10 reps;15 reps Knee Flexion: Strengthening;Both;10 reps;15 reps Marching in Standing: AROM;Both;10 reps;Standing Other Exercises Other Exercises: Dynamic standing  balance training with reaching outside BOS with feet apart, together, and semi-tandem with cues to maintain cervical precautions    General Comments        Pertinent Vitals/Pain Pain Assessment: 0-10 Pain Score: 5  Pain Location: neck Pain Descriptors / Indicators: Sore;Aching Pain  Intervention(s): Premedicated before session;Monitored during session    Home Living Family/patient expects to be discharged to:: Private residence Living Arrangements: Spouse/significant other Available Help at Discharge: Family;Available 24 hours/day Type of Home: House Home Access: Stairs to enter Entrance Stairs-Rails: Right;Left;Can reach both Home Layout: One level Home Equipment: Environmental consultant - 4 wheels;Cane - quad;Cane - single point;Shower seat;Adaptive equipment      Prior Function Level of Independence: Needs assistance  Gait / Transfers Assistance Needed: Ind amb without an AD community distances, no fall history ADL's / Homemaking Assistance Needed: Spouse assists as needed with bathing, dressing, and grooming     PT Goals (current goals can now be found in the care plan section) Acute Rehab PT Goals Patient Stated Goal: to be independent again with no pain PT Goal Formulation: With patient Time For Goal Achievement: 04/23/19 Potential to Achieve Goals: Good Progress towards PT goals: Progressing toward goals    Frequency    BID      PT Plan Current plan remains appropriate    Co-evaluation              AM-PAC PT "6 Clicks" Mobility   Outcome Measure  Help needed turning from your back to your side while in a flat bed without using bedrails?: None Help needed moving from lying on your back to sitting on the side of a flat bed without using bedrails?: None Help needed moving to and from a bed to a chair (including a wheelchair)?: A Little Help needed standing up from a chair using your arms (e.g., wheelchair or bedside chair)?: A Little Help needed to walk in hospital room?: A Little Help needed climbing 3-5 steps with a railing? : A Little 6 Click Score: 20    End of Session Equipment Utilized During Treatment: Gait belt;Cervical collar Activity Tolerance: Patient tolerated treatment well Patient left: in chair;with call bell/phone within reach;with  chair alarm set;with SCD's reapplied;with nursing/sitter in room;Other (comment)(cervical collar donned) Nurse Communication: Mobility status PT Visit Diagnosis: Unsteadiness on feet (R26.81);Difficulty in walking, not elsewhere classified (R26.2);Muscle weakness (generalized) (M62.81)     Time: 1535-1600 PT Time Calculation (min) (ACUTE ONLY): 25 min  Charges:  $Gait Training: 8-22 mins $Therapeutic Exercise: 8-22 mins                     D. Scott Darriel Sinquefield PT, DPT 04/10/19, 4:22 PM

## 2019-04-10 NOTE — Progress Notes (Signed)
I agree with student Rinaldo Ratel Charting

## 2019-04-10 NOTE — Anesthesia Postprocedure Evaluation (Signed)
Anesthesia Post Note  Patient: Bailey Flores  Procedure(s) Performed: ANTERIOR CERVICAL DECOMPRESSION/DISCECTOMY FUSION 5 LEVELS C3-T1 (N/A Neck)  Patient location during evaluation: PACU Anesthesia Type: General Level of consciousness: awake and alert and oriented Pain management: pain level controlled Vital Signs Assessment: post-procedure vital signs reviewed and stable Respiratory status: spontaneous breathing Cardiovascular status: blood pressure returned to baseline Anesthetic complications: no     Last Vitals:  Vitals:   04/10/19 0431 04/10/19 0849  BP: 139/80 (!) 144/86  Pulse: 80 91  Resp: 17 17  Temp: 36.9 C 36.4 C  SpO2: 97% 98%    Last Pain:  Vitals:   04/10/19 0849  TempSrc: Oral  PainSc:                  [name]

## 2019-04-10 NOTE — Progress Notes (Signed)
The patient reports new onset of a sore throat when swallowing. No sign of redness or signs of thrush. RN Margreta Journey informed.

## 2019-04-10 NOTE — Evaluation (Signed)
Physical Therapy Evaluation Patient Details Name: Bailey Flores MRN: [id number] DOB: 28-Nov-1960 Today's Date: 04/10/2019   History of Present Illness  Pt is a 57 yo female diagnosed with cervical myelopathy and is s/p C3-T1 ACDF.   PMH includes lumbar fusion, COPD, emphasema, DOE, HTN, and neuroendocrine CA.    Clinical Impression  Pt presented with min deficits in strength, transfers, mobility, gait, balance, and activity tolerance but overall performed well during the session.  Pt was Mod Ind with bed mobility tasks and steady with transfers from various height surfaces.  Pt was steady during amb with a RW but without an AD the pt did drift mildly left/right that she reports is baseline for her.  Pt was steady ascending and descending stairs with B rails with good eccentric and concentric control.  Pt educated on cervical precautions with good carryover and only occasional min verbal cues for compliance during functional activities.  Pt will benefit from continued skilled PT services per surgeon's recommendation to address the above deficits for improved pt safety and return to PLOF.       Follow Up Recommendations Follow surgeon's recommendation for DC plan and follow-up therapies    Equipment Recommendations  None recommended by PT    Recommendations for Other Services       Precautions / Restrictions Precautions Precautions: Cervical;Fall Precaution Comments: No bending, arching, or twisting; cervical collar may be off in bed, pt may amb to the BR without the collar, don/doff in sitting, may remove collar to shower Required Braces or Orthoses: Cervical Brace Cervical Brace: Other (comment) Restrictions Weight Bearing Restrictions: No      Mobility  Bed Mobility Overal bed mobility: Modified Independent             General bed mobility comments: extra time and effort but no physical assistance needed  Transfers Overall transfer level: Needs  assistance Equipment used: Rolling walker (2 wheeled);None Transfers: Sit to/from Stand Sit to Stand: Supervision         General transfer comment: Good eccentric and concentric control  Ambulation/Gait Ambulation/Gait assistance: Min guard Gait Distance (Feet): 200 Feet Assistive device: Rolling walker (2 wheeled);None Gait Pattern/deviations: Step-through pattern;Decreased step length - right;Decreased step length - left Gait velocity: decreased   General Gait Details: Pt ambulated with both a RW and without an AD with min drifting left/right when ambulating without an AD that the pt reports is baseline for her  Stairs Stairs: Yes Stairs assistance: Min guard Stair Management: Two rails Number of Stairs: 4 General stair comments: Good control and stability ascending and descending stairs with B rails  Wheelchair Mobility    Modified Rankin (Stroke Patients Only)       Balance Overall balance assessment: Needs assistance   Sitting balance-Leahy Scale: Normal     Standing balance support: No upper extremity supported;During functional activity Standing balance-Leahy Scale: Fair                               Pertinent Vitals/Pain Pain Assessment: 0-10 Pain Score: 6  Pain Location: neck Pain Descriptors / Indicators: Sore;Aching Pain Intervention(s): Monitored during session;Repositioned;Premedicated before session    Home Living Family/patient expects to be discharged to:: Private residence Living Arrangements: Spouse/significant other Available Help at Discharge: Family;Available 24 hours/day Type of Home: House Home Access: Stairs to enter Entrance Stairs-Rails: Right;Left;Can reach both Entrance Stairs-Number of Steps: 3 Home Layout: One level Home Equipment: Walker - 4 wheels;Cane -  quad;Cane - single point;Shower seat;Adaptive equipment      Prior Function Level of Independence: Needs assistance   Gait / Transfers Assistance Needed: Ind  amb without an AD community distances, no fall history  ADL's / Homemaking Assistance Needed: Spouse assists as needed with bathing, dressing, and grooming        Hand Dominance   Dominant Hand: Right    Extremity/Trunk Assessment   Upper Extremity Assessment Upper Extremity Assessment: Defer to OT evaluation    Lower Extremity Assessment Lower Extremity Assessment: RLE deficits/detail;LLE deficits/detail RLE Deficits / Details: RLE strength grossly 4/5 RLE Sensation: WNL RLE Coordination: WNL LLE Deficits / Details: LLE strength grossly 4/5; medial calf with decreased sensation to light touch compared to the RLE but chronic per patient LLE Sensation: decreased light touch       Communication   Communication: No difficulties  Cognition Arousal/Alertness: Awake/alert Behavior During Therapy: WFL for tasks assessed/performed Overall Cognitive Status: Within Functional Limits for tasks assessed                                        General Comments      Exercises Total Joint Exercises Ankle Circles/Pumps: Strengthening;Both;10 reps Quad Sets: Strengthening;Both;10 reps Gluteal Sets: Strengthening;Both;10 reps Towel Squeeze: Strengthening;Both;10 reps Heel Slides: AROM;Both;5 reps Hip ABduction/ADduction: AROM;Both;Strengthening;10 reps Long Arc Quad: Strengthening;Both;10 reps Knee Flexion: Strengthening;Both;10 reps Marching in Standing: AROM;Both;10 reps;Standing Other Exercises Other Exercises: Cervical precaution education and review during functional tasks   Assessment/Plan    PT Assessment Patient needs continued PT services  PT Problem List Decreased strength;Decreased activity tolerance;Decreased balance;Decreased knowledge of use of DME;Decreased knowledge of precautions       PT Treatment Interventions DME instruction;Gait training;Stair training;Functional mobility training;Therapeutic activities;Therapeutic exercise;Balance  training;Patient/family education    PT Goals (Current goals can be found in the Care Plan section)  Acute Rehab PT Goals Patient Stated Goal: To walk without pain PT Goal Formulation: With patient Time For Goal Achievement: 04/23/19 Potential to Achieve Goals: Good    Frequency BID   Barriers to discharge        Co-evaluation               AM-PAC PT "6 Clicks" Mobility  Outcome Measure Help needed turning from your back to your side while in a flat bed without using bedrails?: None Help needed moving from lying on your back to sitting on the side of a flat bed without using bedrails?: None Help needed moving to and from a bed to a chair (including a wheelchair)?: A Little Help needed standing up from a chair using your arms (e.g., wheelchair or bedside chair)?: A Little Help needed to walk in hospital room?: A Little Help needed climbing 3-5 steps with a railing? : A Little 6 Click Score: 20    End of Session Equipment Utilized During Treatment: Gait belt;Cervical collar Activity Tolerance: Patient tolerated treatment well Patient left: in chair;with call bell/phone within reach;with chair alarm set;with SCD's reapplied;Other (comment)(cervical collar donned) Nurse Communication: Mobility status PT Visit Diagnosis: Unsteadiness on feet (R26.81);Difficulty in walking, not elsewhere classified (R26.2);Muscle weakness (generalized) (M62.81)    Time: [id number] PT Time Calculation (min) (ACUTE ONLY): 45 min   Charges:   PT Evaluation $PT Eval Moderate Complexity: 1 Mod PT Treatments $Gait Training: 8-22 mins $Therapeutic Exercise: 8-22 mins        D. Royetta Asal PT, DPT  04/10/19, 1:16 PM

## 2019-04-10 NOTE — TOC Transition Note (Signed)
Transition of Care (TOC) - CM/SW Discharge Note   Patient Details  Name: Bailey Flores MRN: [id number] Date of Birth: 08/11/1960  Transition of Care (TOC) CM/SW Contact:   J , RN Phone Number: 04/10/2019, 12:29 PM   Clinical Narrative:    Met with the patient in the room to discuss DC polan and needs She lives at home with her husband, she says he spoils her and takes great care of her He proviides all transportation She is up to date with PCP and can afford her medications She was set up with HH services thru The doctor with [facility] prior to surgery She has a RW at home and does not need additional DME NO additional needs   Final next level of care: Home w Home Health Services Barriers to Discharge: Barriers Resolved   Patient Goals and CMS Choice Patient states their goals for this hospitalization and ongoing recovery are:: go home with her loving husband      Discharge Placement                       Discharge Plan and Services   Discharge Planning Services: CM Consult                      HH Arranged: PT HH Agency: [facility](Set up prior to surgery by Doctor)        Social Determinants of Health (SDOH) Interventions     Readmission Risk Interventions No flowsheet data found.     

## 2019-04-11 ENCOUNTER — Encounter: Payer: Self-pay | Admitting: Neurosurgery

## 2019-04-11 MED ORDER — METHYLPREDNISOLONE 4 MG PO TBPK: ORAL_TABLET | ORAL | 0 refills | Status: DC

## 2019-04-11 MED ORDER — DEXAMETHASONE SODIUM PHOSPHATE 10 MG/ML IJ SOLN
8.0000 mg | Freq: Once | INTRAMUSCULAR | Status: AC
  Administered 2019-04-11: 8 mg via INTRAVENOUS
  Filled 2019-04-11: qty 0.8

## 2019-04-11 MED ORDER — METHOCARBAMOL 500 MG PO TABS: 1000.0000 mg | ORAL_TABLET | Freq: Four times a day (QID) | ORAL | 0 refills | Status: AC

## 2019-04-11 MED ORDER — CELECOXIB 100 MG PO CAPS: 100.0000 mg | ORAL_CAPSULE | Freq: Two times a day (BID) | ORAL | 0 refills | Status: DC

## 2019-04-11 MED ORDER — OXYCODONE HCL 5 MG PO TABS: 5.0000 mg | ORAL_TABLET | ORAL | 0 refills | Status: DC | PRN

## 2019-04-11 NOTE — Transfer of Care (Signed)
Immediate Anesthesia Transfer of Care Note  Patient: Bailey Flores  Procedure(s) Performed: ANTERIOR CERVICAL DECOMPRESSION/DISCECTOMY FUSION 5 LEVELS C3-T1 (N/A Neck)  Patient Location: PACU  Anesthesia Type:General  Level of Consciousness: awake  Airway & Oxygen Therapy: Patient connected to face mask oxygen  Post-op Assessment: Post -op Vital signs reviewed and stable  Post vital signs: stable  Last Vitals:  Vitals Value Taken Time  BP 147/84 04/11/19 0403  Temp 36.7 C 04/11/19 0403  Pulse 95 04/11/19 0403  Resp 16 04/11/19 0403  SpO2 92 % 04/11/19 0403    Last Pain:  Vitals:   04/11/19 0403  TempSrc: Oral  PainSc:       Patients Stated Pain Goal: 6 ([id number])  Complications: No apparent anesthesia complications

## 2019-04-11 NOTE — Discharge Summary (Signed)
Procedure: C3-T1 ACDF Procedure date: 04/09/2019 Diagnosis: Cervical radiculopathy, myelopathy   History: Bailey Flores is s/p C3-T1 ACDF  POD2: Continues to recover well.  Pain slightly increased last night did not sleep very well.  Currently rated 6-7/10.  Feels that it is adequately controlled with current pain regimen.  Ambulating, eating, and voiding without issue.  She does complain of increased difficulty with swallowing today.  Able to tolerate liquids and soft food.  Denies any new upper or lower extremity pain/numbness/tingling/weakness. Drain output 50 yesterday  POD 1: Patient is recovering well.  Complains of 6/10 posterior neck pain.  Swallowing without difficulty. Drain output 120  POD0: Tolerated procedure well. Evaluated in post op recovery still disoriented from anesthesia.  Planes of posterior neck pain Physical Exam: Vitals:   04/11/19 0403 04/11/19 0754  BP: (!) 147/84 (!) 149/90  Pulse: 95 97  Resp: 16 18  Temp: 98 F (36.7 C) 98 F (36.7 C)  SpO2: 92% 95%    General: Alert and oriented, sitting up in chair.  Brace present Strength: 5/5 throughout upper and lower extremities Sensation: Intact and symmetric throughout upper and lower extremities Skin: Glue intact at incision sites.  Minimal swelling.  No active bleeding at drain insertion point.   Data:  No results for input(s): NA, K, CL, CO2, BUN, CREATININE, LABGLOM, GLUCOSE, CALCIUM in the last 168 hours. No results for input(s): AST, ALT, ALKPHOS in the last 168 hours.  Invalid input(s): TBILI   No results for input(s): WBC, HGB, HCT, PLT in the last 168 hours. No results for input(s): APTT, INR in the last 168 hours.       Other tests/results:  EXAM: CERVICAL SPINE - 2-3 VIEW 04/10/2019  IMPRESSION: Status post surgical anterior fusion at multiple levels as described Above.  Assessment/Plan:  Bailey Flores is POD2 s/p C3-T1 ACDF.  Drain removed today without issue  and dressed with light gauze and paper tape.  No active bleeding.   Patient is recovering well. She has ambulated, voided, and eaten without issue. Pain adequately controlled on current pain regimen. Will continue post op pain management with tylenol, muscle relaxer, celebrex, and pain medication as needed. Advised to continue stool softener while taking narcotics.  Medrol Dosepak provided for increase in swallowing discomfort noted today. Discussed activity restrictions and wound care. She is scheduled to follow up in clinic in approximately 2 weeks. Advised to contact office if any questions or concerns arise before then.   [name] PA-C Department of Neurosurgery

## 2019-04-11 NOTE — Discharge Instructions (Signed)
Your surgeon has performed an operation on your cervical spine (neck) to relieve pressure on the spinal cord and/or nerves. This involved making an incision in the front of your neck and removing one or more of the discs that support your spine. Next, a small piece of bone, a titanium plate, and screws were used to fuse two or more of the vertebrae (bones) together.  The following are instructions to help in your recovery once you have been discharged from the hospital. Even if you feel well, it is important that you follow these activity guidelines. If you do not let your neck heal properly from the surgery, you can increase the chance of return of your symptoms and other complications.  * Do not take anti-inflammatory medications for 3 months after surgery (naproxen [Aleve], ibuprofen [Advil, Motrin], etc.). These medications can prevent your bones from healing properly. *  Medrol dosepack has been sent in to help with post op swallowing discomfort. You may begin celebrex after the steroid has been completed.  * Take stool softener while taking narcotics to help with possible constipation.  Activity    No bending, lifting, or twisting (BLT). Avoid lifting objects heavier than 10 pounds (gallon milk jug).  Where possible, avoid household activities that involve lifting, bending, reaching, pushing, or pulling such as laundry, vacuuming, grocery shopping, and childcare. Try to arrange for help from friends and family for these activities while your back heals.  Increase physical activity slowly as tolerated.  Taking short walks is encouraged, but avoid strenuous exercise. Do not jog, run, bicycle, lift weights, or participate in any other exercises unless specifically allowed by your doctor.  Talk to your doctor before resuming sexual activity.  You should not drive until cleared by your doctor.  Until released by your doctor, you should not return to work or school.  You should rest at home and  let your body heal.   You may shower three days after your surgery.  After showering, lightly dab your incision dry. Do not take a tub bath or go swimming until approved by your doctor at your follow-up appointment.  If your doctor ordered a cervical collar (neck brace) for you, you should wear it whenever you are out of bed. You may remove it when lying down or sleeping, but you should wear it at all other times. Not all neck surgeries require a cervical collar.  If you smoke, we strongly recommend that you quit.  Smoking has been proven to interfere with normal bone healing and will dramatically reduce the success rate of your surgery. Please contact [facility] (800-QUIT-NOW) and use the resources at www.[facility].com for assistance in stopping smoking.  Surgical Incision   If you have a dressing on your incision, you may remove it two days after your surgery. Keep your incision area clean and dry.  If you have staples or stitches on your incision, you should have a follow up scheduled for removal. If you do not have staples or stitches, you will have steri-strips (small pieces of surgical tape) or Dermabond glue. The steri-strips/glue should begin to peel away within about a week (it is fine if the steri-strips fall off before then). If the strips are still in place one week after your surgery, you may gently remove them.  Diet           You may return to your usual diet. However, you may experience discomfort when swallowing in the first month after your surgery. This is normal.  You may find that softer foods are more comfortable for you to swallow. Be sure to stay hydrated.  When to Contact Bailey Flores  You may experience pain in your neck and/or pain between your shoulder blades. This is normal and should improve in the next few weeks with the help of pain medication, muscle relaxers, and rest. Some patients report that a warm compress on the back of the neck or between the shoulder blades  helps.  However, should you experience any of the following, contact Bailey Flores immediately:  New numbness or weakness  Pain that is progressively getting worse, and is not relieved by your pain medication, muscle relaxers, rest, and warm compresses  Bleeding, redness, swelling, pain, or drainage from surgical incision  Chills or flu-like symptoms  Fever greater than 101.0 F (38.3 C)  Inability to eat, drink fluids, or take medications  Problems with bowel or bladder functions  Difficulty breathing or shortness of breath  Warmth, tenderness, or swelling in your calf Contact Information  During office hours (Monday-Friday 9 am to 5 pm), please call your physician at [phone] and ask for Berdine Addison  After hours and weekends, please call the [place] Operator at [phone] and ask for the Neurosurgery Resident On Call   For a life-threatening emergency, call 911

## 2019-04-11 NOTE — Progress Notes (Signed)
Physical Therapy Treatment Patient Details Name: Bailey Flores MRN: [id number] DOB: [birth year] Today's Date: 04/11/2019    History of Present Illness Pt is a 58 yo female diagnosed with cervical myelopathy and is s/p C3-T1 ACDF.   PMH includes lumbar fusion, COPD, emphasema, DOE, HTN, and neuroendocrine CA.    PT Comments    Pt presented with min deficits in strength, transfers, gait, balance, and activity tolerance but continued to progress well towards goals. Pt was able to amb 200 feet both with and without an AD.  While ambulating without an AD pt was noted to present with L knee hyperextension during stance phase as well as mild drifting left/right, both of which improved during amb with the walker.  Pt was steady ascending and descending 4 steps with only one rail this session secondary to pt concerned that she may not be able to reach both rails at home.  Pt will benefit from PT services per surgeon's recommendation upon discharge to safely address above deficits for decreased caregiver assistance and eventual return to PLOF.     Follow Up Recommendations  Follow surgeon's recommendation for DC plan and follow-up therapies     Equipment Recommendations  None recommended by PT    Recommendations for Other Services       Precautions / Restrictions Precautions Precautions: Cervical;Fall Precaution Comments: No bending, arching, or twisting; cervical collar may be off in bed, pt may amb to the BR without the collar, don/doff in sitting, may remove collar to shower Required Braces or Orthoses: Cervical Brace Restrictions Weight Bearing Restrictions: No    Mobility  Bed Mobility               General bed mobility comments: NT, pt in recliner  Transfers Overall transfer level: Needs assistance Equipment used: None Transfers: Sit to/from Stand Sit to Stand: Supervision         General transfer comment: Good eccentric and concentric  control  Ambulation/Gait Ambulation/Gait assistance: Supervision Gait Distance (Feet): 200 Feet x 2 Assistive device: None;Rolling walker (2 wheeled) Gait Pattern/deviations: Step-through pattern;Decreased step length - right;Decreased step length - left Gait velocity: decreased   General Gait Details: Noted L knee hyperextension during stance phase that pt was able to control with cues when using the RW; min drifting left/right with amb without an AD that improved with the RW   Stairs Stairs: Yes Stairs assistance: Min guard Stair Management: One rail Right Number of Stairs: 4 General stair comments: Good control and stability ascending and descending stairs with B rails with one rail secondary to pt concerned may not be able to reach both rails at home   Wheelchair Mobility    Modified Rankin (Stroke Patients Only)       Balance Overall balance assessment: Needs assistance   Sitting balance-Leahy Scale: Normal     Standing balance support: No upper extremity supported;During functional activity Standing balance-Leahy Scale: Fair                              Cognition Arousal/Alertness: Awake/alert Behavior During Therapy: WFL for tasks assessed/performed Overall Cognitive Status: Within Functional Limits for tasks assessed                                        Exercises Total Joint Exercises Ankle Circles/Pumps: Strengthening;Both;10 reps Quad Sets: Strengthening;Both;10 reps  Gluteal Sets: Strengthening;Both;10 reps Long Arc Quad: Strengthening;Both;10 reps Knee Flexion: Strengthening;Both;10 reps Marching in Standing: AROM;Both;Standing;5 reps Other Exercises Other Exercises: Cervical precaution review    General Comments        Pertinent Vitals/Pain Pain Assessment: 0-10 Pain Score: 8  Pain Location: neck Pain Descriptors / Indicators: Sore;Aching Pain Intervention(s): Premedicated before session;Monitored during session     Home Living                      Prior Function            PT Goals (current goals can now be found in the care plan section) Progress towards PT goals: Progressing toward goals    Frequency    BID      PT Plan Current plan remains appropriate    Co-evaluation              AM-PAC PT "6 Clicks" Mobility   Outcome Measure  Help needed turning from your back to your side while in a flat bed without using bedrails?: None Help needed moving from lying on your back to sitting on the side of a flat bed without using bedrails?: None Help needed moving to and from a bed to a chair (including a wheelchair)?: A Little Help needed standing up from a chair using your arms (e.g., wheelchair or bedside chair)?: A Little Help needed to walk in hospital room?: A Little Help needed climbing 3-5 steps with a railing? : A Little 6 Click Score: 20    End of Session Equipment Utilized During Treatment: Gait belt;Cervical collar Activity Tolerance: Patient tolerated treatment well Patient left: in chair;with call bell/phone within reach;with chair alarm set;with SCD's reapplied;with nursing/sitter in room;Other (comment)(cervical collar donned) Nurse Communication: Mobility status PT Visit Diagnosis: Unsteadiness on feet (R26.81);Difficulty in walking, not elsewhere classified (R26.2);Muscle weakness (generalized) (M62.81)     Time: [id number] PT Time Calculation (min) (ACUTE ONLY): 30 min  Charges:  $Gait Training: 8-22 mins $Therapeutic Exercise: 8-22 mins                     D. Scott Shayleigh Bouldin PT, DPT 04/11/19, 12:10 PM

## 2019-04-21 DIAGNOSIS — I2584 Coronary atherosclerosis due to calcified coronary lesion: Secondary | ICD-10-CM | POA: Insufficient documentation

## 2019-04-21 DIAGNOSIS — I2510 Atherosclerotic heart disease of native coronary artery without angina pectoris: Secondary | ICD-10-CM | POA: Insufficient documentation

## 2019-04-24 DIAGNOSIS — F3176 Bipolar disorder, in full remission, most recent episode depressed: Secondary | ICD-10-CM | POA: Insufficient documentation

## 2019-04-24 NOTE — Progress Notes (Signed)
Virtual Visit via Video Note  I connected with [place] on 04/25/19 at 10:30 AM EST by a video enabled telemedicine application and verified that I am speaking with the correct person using two identifiers.   I discussed the limitations of evaluation and management by telemedicine and the availability of in person appointments. The patient expressed understanding and agreed to proceed.      I discussed the assessment and treatment plan with the patient. The patient was provided an opportunity to ask questions and all were answered. The patient agreed with the plan and demonstrated an understanding of the instructions.   The patient was advised to call back or seek an in-person evaluation if the symptoms worsen or if the condition fails to improve as anticipated.   [place] MD OP Progress Note  04/25/2019 10:46 AM Bailey Flores  MRN:  [id number]  Chief Complaint:  Chief Complaint    Follow-up     [id number] is a 58 year old female, married, unemployed, has a history of bipolar disorder, ADHD, cognitive disorder, COPD, hypertension, chronic pain was evaluated by telemedicine today.  Patient today reports that she recently had surgery of her neck-cervical fusion.  She continues to wear a neck collar and reports that she takes oxycodone as well as Robaxin which helps.  She however continues to struggle with pain especially at night.  Her sleep is also restless due to the same.  She however reports she is making progress.  She has a long recovery process and she is taking it 1 day at a time.  She reports mood wise she is doing okay.  She denies any significant mood swings, anxiety or depression.  She continues to take Vyvanse and denies any concerns.  Her attention and focus continues to be okay.  Patient denies any suicidality, homicidality or perceptual disturbances.  Patient reports her husband continues to be supportive.  Patient reports she had a good Thanksgiving  holiday with her son who visited from [place].  She reports she and her husband are moving to [place] within the next couple of months.  She reports her son wanted them to be closer to him so he can take care of them.  She is excited about the move.    Visit Diagnosis:    ICD-10-CM   1. Bipolar 1 disorder, depressed, full remission ([place])  F31.76   2. Attention deficit hyperactivity disorder (ADHD), predominantly inattentive type  F90.0     Past Psychiatric History: Reviewed past psychiatric history from my progress note on 08/10/2017.  Past Medical History:  Past Medical History:  Diagnosis Date  . Arthritis    hands  . Asthma   . Barrett's esophagus   . Cancer [facility])    neuroendocrine ca  . COPD (chronic obstructive pulmonary disease) ([place])   . Depression   . GERD (gastroesophageal reflux disease)   . Hypertension   . Motion sickness    reading in car  . 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 Orthodontics    permanent retainers, top and bottom  . Pneumonia 2017, 2020  . Sleep apnea     Past Surgical History:  Procedure Laterality Date  . ABDOMINAL HYSTERECTOMY    . ANTERIOR CERVICAL DECOMPRESSION/DISCECTOMY FUSION 4 LEVELS N/A 04/09/2019   Procedure: ANTERIOR CERVICAL DECOMPRESSION/DISCECTOMY FUSION 5 LEVELS C3-T1;  Surgeon: Meade Maw, MD;  Location: [facility] ORS;  Service: Neurosurgery;  Laterality: N/A;  . BIOPSY  03/17/2019   Procedure: BIOPSY;  Surgeon: [name], MD;  Location: [facility];  Service: Endoscopy;;  . BOWEL RESECTION  07/19/2012  . CESAREAN SECTION     x3  . ESOPHAGOGASTRODUODENOSCOPY (EGD) WITH PROPOFOL N/A 03/05/2017   Procedure: ESOPHAGOGASTRODUODENOSCOPY (EGD) WITH PROPOFOL;  Surgeon: [name], MD;  Location: [place];  Service: Endoscopy;  Laterality: N/A;  . ESOPHAGOGASTRODUODENOSCOPY (EGD) WITH PROPOFOL N/A 03/30/2017   REPEAT IN 03/2019  . ESOPHAGOGASTRODUODENOSCOPY (EGD) WITH PROPOFOL N/A 03/17/2019    Procedure: ESOPHAGOGASTRODUODENOSCOPY (EGD) WITH PROPOFOL;  Surgeon: [name], MD;  Location: [place];  Service: Endoscopy;  Laterality: N/A;  . HAND SURGERY Right   . TRANSFORAMINAL LUMBAR INTERBODY FUSION (TLIF) WITH PEDICLE SCREW FIXATION 1 LEVEL N/A 12/05/2017   Procedure: TRANSFORAMINAL LUMBAR INTERBODY FUSION (TLIF) WITH PEDICLE SCREW FIXATION 1 LEVEL-L4-5;  Surgeon: Meade Maw, MD;  Location: [facility] ORS;  Service: Neurosurgery;  Laterality: N/A;    Family Psychiatric History: Reviewed family psychiatric history from my progress note on 08/10/2017.  Family History:  Family History  Problem Relation Age of Onset  . Breast cancer Cousin        mat cousin    Social History: Reviewed social history from my progress note on 08/10/2017. Social History   Socioeconomic History  . Marital status: Married    Spouse name: Not on file  . Number of children: Not on file  . Years of education: Not on file  . Highest education level: Not on file  Occupational History  . Not on file  Social Needs  . Financial resource strain: Not on file  . Food insecurity    Worry: Not on file    Inability: Not on file  . Transportation needs    Medical: Not on file    Non-medical: Not on file  Tobacco Use  . Smoking status: Former Smoker    Packs/day: 2.00    Years: 37.00    Pack years: 74.00    Types: Cigarettes    Quit date: 07/19/2012    Years since quitting: 6.7  . Smokeless tobacco: Never Used  Substance and Sexual Activity  . Alcohol use: Yes    Comment: 2 drinks/mo  . Drug use: No  . Sexual activity: Yes  Lifestyle  . Physical activity    Days per week: Not on file    Minutes per session: Not on file  . Stress: Not on file  Relationships  . Social Herbalist on phone: Not on file    Gets together: Not on file    Attends religious service: Not on file    Active member of club or organization: Not on file    Attends meetings of clubs or  organizations: Not on file    Relationship status: Not on file  Other Topics Concern  . Not on file  Social History Narrative  . Not on file    Allergies: No Known Allergies  Metabolic Disorder Labs: Lab Results  Component Value Date   HGBA1C 5.9 (H) 06/13/2017   MPG 122.63 06/13/2017   Lab Results  Component Value Date   PROLACTIN 4.6 (L) 06/13/2017   Lab Results  Component Value Date   CHOL 183 06/13/2017   TRIG 80 06/13/2017   HDL 92 06/13/2017   CHOLHDL 2.0 06/13/2017   VLDL 16 06/13/2017   LDLCALC 75 06/13/2017   Lab Results  Component Value Date   TSH 1.205 06/13/2017    Therapeutic Level Labs: No results found for: LITHIUM No results found for: VALPROATE No components found  for:  CBMZ  Current Medications: Current Outpatient Medications  Medication Sig Dispense Refill  . aspirin 81 MG chewable tablet Chew 81 mg by mouth daily.    Marland Kitchen acetaminophen (TYLENOL) 650 MG CR tablet Take 325 mg by mouth 2 (two) times daily as needed (when taking hydrocodone).    . ADVAIR DISKUS 250-50 MCG/DOSE AEPB USE 1 INHALATION TWICE A DAY (Patient taking differently: Inhale 1 puff into the lungs 2 (two) times daily. ) 180 each 4  . albuterol (PROVENTIL HFA;VENTOLIN HFA) 108 (90 Base) MCG/ACT inhaler Inhale 1-2 puffs into the lungs every 4 (four) hours as needed for wheezing or shortness of breath.     Marland Kitchen buPROPion (WELLBUTRIN SR) 200 MG 12 hr tablet TAKE 1 TABLET TWICE A DAY (Patient taking differently: Take 200 mg by mouth 2 (two) times daily. ) 180 tablet 4  . celecoxib (CELEBREX) 100 MG capsule Take 1 capsule (100 mg total) by mouth 2 (two) times daily. 60 capsule 0  . Cholecalciferol (VITAMIN D3) 25 MCG (1000 UT) CAPS Take 1,000 Units by mouth at bedtime.     . diphenhydrAMINE (BENADRYL) 25 MG tablet Take 25 mg by mouth daily as needed for allergies.    Marland Kitchen gabapentin (NEURONTIN) 300 MG capsule Take 300 mg 3 (three) times daily by mouth.     Marland Kitchen ipratropium-albuterol (DUONEB)  0.5-2.5 (3) MG/3ML SOLN Inhale 3 mLs into the lungs every 4 (four) hours as needed (Only uses with cold symptoms).     Marland Kitchen lamoTRIgine (LAMICTAL) 100 MG tablet Take 3 tablets (300 mg total) by mouth daily. (Patient taking differently: Take 100-200 mg by mouth See admin instructions. Take 100 mg in the morning and 200 mg at night) 270 tablet 4  . lisdexamfetamine (VYVANSE) 70 MG capsule Take 1 capsule (70 mg total) by mouth daily. 90 capsule 0  . magnesium oxide (MAG-OX) 400 MG tablet Take 400-1,200 mg by mouth See admin instructions. Take 400 mg by mouth twice daily unless experiencing constipation then take 800 mg in the afternoon and 1200 mg in the evening    . methocarbamol (ROBAXIN) 500 MG tablet Take 2 tablets (1,000 mg total) by mouth every 6 (six) hours. 80 tablet 0  . methylPREDNISolone (MEDROL DOSEPAK) 4 MG TBPK tablet Follow package directions 21 tablet 0  . Milk Thistle 150 MG CAPS Take 150 mg by mouth at bedtime.    . montelukast (SINGULAIR) 10 MG tablet Take 10 mg by mouth at bedtime.     . Multiple Vitamins-Minerals (CENTRUM SILVER PO) Take 1 tablet by mouth daily.    Marland Kitchen omeprazole (PRILOSEC) 40 MG capsule Take 40 mg by mouth daily.     Marland Kitchen oxyCODONE (OXY IR/ROXICODONE) 5 MG immediate release tablet Take 1 tablet (5 mg total) by mouth every 3 (three) hours as needed for moderate pain ((score 4 to 6)). 30 tablet 0  . SPIRIVA RESPIMAT 2.5 MCG/ACT AERS Inhale 2 puffs into the lungs every morning.     . valACYclovir (VALTREX) 1000 MG tablet Take 2,000 mg by mouth 2 (two) times daily as needed (herpes simplex).     . valsartan-hydrochlorothiazide (DIOVAN-HCT) 160-12.5 MG tablet Take 1 tablet by mouth at bedtime.      No current facility-administered medications for this visit.      Musculoskeletal: Strength & Muscle Tone: UTA Gait & Station: normal Patient leans: N/A  Psychiatric Specialty Exam: Review of Systems  Musculoskeletal: Positive for neck pain.  Psychiatric/Behavioral: The  patient has insomnia (restless due to  recent surgery of neck , pain).   All other systems reviewed and are negative.   There were no vitals taken for this visit.There is no height or weight on file to calculate BMI.  General Appearance: Casual  Eye Contact:  Fair  Speech:  Clear and Coherent  Volume:  Normal  Mood:  Euthymic  Affect:  Congruent  Thought Process:  Goal Directed and Descriptions of Associations: Intact  Orientation:  Full (Time, Place, and Person)  Thought Content: Logical   Suicidal Thoughts:  No  Homicidal Thoughts:  No  Memory:  Immediate;   Fair Recent;   Fair Remote;   Fair  Judgement:  Fair  Insight:  Fair  Psychomotor Activity:  Normal  Concentration:  Concentration: Fair and Attention Span: Fair  Recall:  [employer] of Knowledge: Fair  Language: Fair  Akathisia:  No  Handed:  Right  AIMS (if indicated): denies tremors, rigidity  Assets:  Communication Skills Desire for Improvement Social Support  ADL's:  Intact  Cognition: WNL  Sleep:  restless   Screenings: PHQ2-9     Pulmonary Rehab from 06/25/2017 in [facility]  PHQ-2 Total Score  4  PHQ-9 Total Score  19       Assessment and Plan:Bailey Flores is a 58 year old Hispanic female who has a history of bipolar disorder, ADHD, cognitive disorder, COPD, GERD was evaluated by telemedicine today.  She continues to have psychosocial stressors of relationship struggles, her own health issues, recent surgery as well as chronic pain.  Patient however is currently doing well on the current medication regimen.  She is planning to relocate to [place].  Discussed with patient that she can be provided with 3 to 6 months supplies of her medications except for the controlled substance Vyvanse.  Advised patient to establish care with primary care provider once she moves to [place] who should be able to refill her medications if needed as well as should be able to refer her to psychiatrist as  needed.  Plan Bipolar disorder in remission Lamictal 300 mg p.o. daily in divided dosage Wellbutrin SR 200 mg p.o. twice daily  For anxiety symptoms-stable Hydroxyzine 25 mg p.o. daily as needed  ADHD-stable Vyvanse 70 mg p.o. daily I have reviewed [place] controlled substance database.  Follow-up in clinic as needed.  Patient is relocating to [place].  I have spent atleast 15 minutes non face to face with patient today. More than 50 % of the time was spent for psychoeducation and supportive psychotherapy and care coordination. This note was generated in part or whole with voice recognition software. Voice recognition is usually quite accurate but there are transcription errors that can and very often do occur. I apologize for any typographical errors that were not detected and corrected.         [name], MD 04/25/2019, 10:46 AM

## 2019-04-25 ENCOUNTER — Encounter: Payer: Self-pay | Admitting: Psychiatry

## 2019-04-25 ENCOUNTER — Ambulatory Visit (INDEPENDENT_AMBULATORY_CARE_PROVIDER_SITE_OTHER): Admitting: Psychiatry

## 2019-04-25 ENCOUNTER — Other Ambulatory Visit: Payer: Self-pay

## 2019-04-25 DIAGNOSIS — F900 Attention-deficit hyperactivity disorder, predominantly inattentive type: Secondary | ICD-10-CM

## 2019-04-25 DIAGNOSIS — F3176 Bipolar disorder, in full remission, most recent episode depressed: Secondary | ICD-10-CM

## 2019-05-06 ENCOUNTER — Telehealth: Payer: Self-pay | Admitting: *Deleted

## 2019-05-06 DIAGNOSIS — Z87891 Personal history of nicotine dependence: Secondary | ICD-10-CM

## 2019-05-07 ENCOUNTER — Inpatient Hospital Stay

## 2019-05-07 NOTE — Telephone Encounter (Signed)
Patient has been notified that annual lung cancer screening low dose CT scan is due currently or will be in near future. Confirmed that patient is within the age range of 55-77, and asymptomatic, (no signs or symptoms of lung cancer). Patient denies illness that would prevent curative treatment for lung cancer if found. Verified smoking history, (former, quit 07/19/12, 74 pack year). The shared decision making visit was done 05/01/17. Patient is agreeable for CT scan being scheduled.

## 2019-05-08 ENCOUNTER — Ambulatory Visit
Admission: RE | Admit: 2019-05-08 | Discharge: 2019-05-08 | Disposition: A | Discharge status: Home / Self Care | Source: Ambulatory Visit | Attending: Oncology | Admitting: Oncology

## 2019-05-08 ENCOUNTER — Other Ambulatory Visit: Payer: Self-pay

## 2019-05-08 DIAGNOSIS — Z87891 Personal history of nicotine dependence: Secondary | ICD-10-CM | POA: Insufficient documentation

## 2019-05-12 ENCOUNTER — Telehealth: Payer: Self-pay | Admitting: *Deleted

## 2019-05-12 NOTE — Telephone Encounter (Signed)
Notified patient of LDCT lung cancer screening program results with recommendation for 6 month follow up imaging. Also notified of incidental findings noted below and is encouraged to discuss further with PCP who will receive a copy of this note and/or the CT report. Patient verbalizes understanding.   Patient reports a chronic cough productive of green sputum, denies fever. Patient is encouraged to contact pulmonologist to make appt to follow up on her symptoms and this CT finding.   IMPRESSION: 1. Lung-RADS 3, probably benign findings. Short-term follow-up in 6 months is recommended with repeat low-dose chest CT without contrast (please use the following order, "CT CHEST LCS NODULE FOLLOW-UP W/O CM"). Since the prior screening CT, progression of bibasilar bronchial wall thickening, mucoid impaction, and peribronchovascular reticulonodular opacities. Again suspicious for chronic infection or aspiration. Areas of more nodular opacity are most likely infectious or inflammatory. 2. Age advanced coronary artery atherosclerosis. Recommend assessment of coronary risk factors and consideration of medical therapy. 3.  Emphysema (ICD10-J43.9). 4. Similar right subcoracoid recess calcified mass which could represent synovial chondromatosis or bursitis.

## 2019-05-13 ENCOUNTER — Encounter: Payer: Self-pay | Admitting: Pulmonary Disease

## 2019-05-13 ENCOUNTER — Ambulatory Visit (INDEPENDENT_AMBULATORY_CARE_PROVIDER_SITE_OTHER): Admitting: Pulmonary Disease

## 2019-05-13 DIAGNOSIS — I7090 Unspecified atherosclerosis: Secondary | ICD-10-CM

## 2019-05-13 DIAGNOSIS — G4733 Obstructive sleep apnea (adult) (pediatric): Secondary | ICD-10-CM | POA: Diagnosis not present

## 2019-05-13 DIAGNOSIS — R918 Other nonspecific abnormal finding of lung field: Secondary | ICD-10-CM | POA: Diagnosis not present

## 2019-05-13 DIAGNOSIS — J449 Chronic obstructive pulmonary disease, unspecified: Secondary | ICD-10-CM | POA: Diagnosis not present

## 2019-05-13 NOTE — Patient Instructions (Signed)
Please call the office once you get back to [place] to arrange for sputum culture, and AFB sputum culture  Follow up in 1 month

## 2019-05-13 NOTE — Progress Notes (Signed)
Bailey Flores, Bailey Flores, Bailey Flores  Chief Complaint  Patient presents with  . Follow-up    Patient needs Ct scan results. Patient has been having green/brown sputum for the last week.    Constitutional:  There were no vitals taken for this visit.  Deferred.  Past Medical History:  Barrett's esophagus, Neuroendocrine cancer, Depression, GERD, PNA  Brief Summary:  Bailey Flores is a 58 y.o. female former smoker with COPD Bailey obstructive sleep apnea.  Virtual Visit via Telephone Note  I connected with Bailey Flores on 05/13/19 at 11:00 AM EST by telephone Bailey verified that I am speaking with the correct person using two identifiers.  Location: Patient: [place] Provider: Medical office   I discussed the limitations, risks, security Bailey privacy concerns of performing an evaluation Bailey management service by telephone Bailey the availability of in person appointments. I also discussed with the patient that there may be a patient responsible charge related to this service. The patient expressed understanding Bailey agreed to proceed.  She is followed by Bailey Flores.  She is currently in [place].  She is in the process of moving to [place], Bailey will move there in a couple of months.  She had lung cancer screening low dose CT chest done on 05/09/19.  This showed nodular airway impaction, atherosclerosis, Bailey emphysema (reviewed by me).  She was advised to discuss with Flores.  She has chronic cough with brown sputum.  Not having chest pain, fever, wheeze, sweats, hemoptysis, or weight loss.  She feels her breathing is okay on inhaler therapy.  She had neck surgery recently, Bailey has noticed trouble swallowing after this.  She also has episodes of reflux.     Physical Exam:  Deferred.  Assessment/Plan:   Nodular infiltrates on recent CT chest. - main concern for atypical infection/airway inflammation, perhaps from reflux - will arrange for sputum  culture Bailey sputum for AFB when she returns to [place] - advised her to arrange for medical team to follow up on CT chest when she moves to [place]  COPD with chronic bronchitis Bailey emphysema. - continue advair, spiriva, singulair Bailey prn albuterol  Obstructive sleep apnea. - she hasn't been on therapy recently - advised her to have further assessment when she moves to [place]  Atherosclerosis on CT chest. - she doesn't have symptoms to suggest coronary angina - advised her to f/u with PCP to discuss life style modification Bailey management of cholesterol    Patient Instructions  Please call the office once you get back to [place] to arrange for sputum culture, Bailey AFB sputum culture  Follow up in 1 month   I discussed the assessment Bailey treatment plan with the patient. The patient was provided an opportunity to ask questions Bailey all were answered. The patient agreed with the plan Bailey demonstrated an understanding of the instructions.   The patient was advised to call back or seek an in-person evaluation if the symptoms worsen or if the condition fails to improve as anticipated.  I provided 23 minutes of non-face-to-face time during this encounter.  Chesley Mires, MD [place] Pager: [phone] 05/13/2019, 11:11 AM  Flow Sheet     Flores tests:  PFT 09/06/17 >> FEV1 1.41 (67%), FEV1% 62, TLC 3.22 (79%), DLCO 80%   Chest imaging:  CT chest lung cancer screening 05/09/19 >> atherosclerosis, mild/mod emphysema, bronchial wall thickening, multiple nodules up to 1.5 cm  Sleep tests:  HST 07/03/17 >> AHI 20.5, SpO2  low 61%  Medications:   Allergies as of 05/13/2019   No Known Allergies     Medication List       Accurate as of May 13, 2019 11:11 AM. If you have any questions, ask your nurse or doctor.        acetaminophen 650 MG CR tablet Commonly known as: TYLENOL Take 325 mg by mouth 2 (two) times daily as needed (when  taking hydrocodone).   Advair Diskus 250-50 MCG/DOSE Aepb Generic drug: Fluticasone-Salmeterol USE 1 INHALATION TWICE A DAY What changed: See the new instructions.   albuterol 108 (90 Base) MCG/ACT inhaler Commonly known as: VENTOLIN HFA Inhale 1-2 puffs into the lungs every 4 (four) hours as needed for wheezing or shortness of breath.   aspirin 81 MG chewable tablet Chew 81 mg by mouth daily.   buPROPion 200 MG 12 hr tablet Commonly known as: WELLBUTRIN SR TAKE 1 TABLET TWICE A DAY   celecoxib 100 MG capsule Commonly known as: CeleBREX Take 1 capsule (100 mg total) by mouth 2 (two) times daily.   CENTRUM SILVER PO Take 1 tablet by mouth daily.   diphenhydrAMINE 25 MG tablet Commonly known as: BENADRYL Take 25 mg by mouth daily as needed for allergies.   gabapentin 300 MG capsule Commonly known as: NEURONTIN Take 300 mg 3 (three) times daily by mouth.   ipratropium-albuterol 0.5-2.5 (3) MG/3ML Soln Commonly known as: DUONEB Inhale 3 mLs into the lungs every 4 (four) hours as needed (Only uses with cold symptoms).   lamoTRIgine 100 MG tablet Commonly known as: LAMICTAL Take 3 tablets (300 mg total) by mouth daily. What changed:   how much to take  when to take this  additional instructions   lisdexamfetamine 70 MG capsule Commonly known as: VYVANSE Take 1 capsule (70 mg total) by mouth daily.   magnesium oxide 400 MG tablet Commonly known as: MAG-OX Take 400-1,200 mg by mouth See admin instructions. Take 400 mg by mouth twice daily unless experiencing constipation then take 800 mg in the afternoon Bailey 1200 mg in the evening   methocarbamol 500 MG tablet Commonly known as: ROBAXIN Take 2 tablets (1,000 mg total) by mouth every 6 (six) hours.   methylPREDNISolone 4 MG Tbpk tablet Commonly known as: MEDROL DOSEPAK Follow package directions   Milk Thistle 150 MG Caps Take 150 mg by mouth at bedtime.   montelukast 10 MG tablet Commonly known as:  SINGULAIR Take 10 mg by mouth at bedtime.   omeprazole 40 MG capsule Commonly known as: PRILOSEC Take 40 mg by mouth daily.   oxyCODONE 5 MG immediate release tablet Commonly known as: Oxy IR/ROXICODONE Take 1 tablet (5 mg total) by mouth every 3 (three) hours as needed for moderate pain ((score 4 to 6)).   Spiriva Respimat 2.5 MCG/ACT Aers Generic drug: Tiotropium Bromide Monohydrate Inhale 2 puffs into the lungs every morning.   valACYclovir 1000 MG tablet Commonly known as: VALTREX Take 2,000 mg by mouth 2 (two) times daily as needed (herpes simplex).   valsartan-hydrochlorothiazide 160-12.5 MG tablet Commonly known as: DIOVAN-HCT Take 1 tablet by mouth at bedtime.   Vitamin D3 25 MCG (1000 UT) Caps Take 1,000 Units by mouth at bedtime.       Past Surgical History:  She  has a past surgical history that includes Bowel resection (07/19/2012); Abdominal hysterectomy; Cesarean section; Hand surgery (Right); Esophagogastroduodenoscopy (egd) with propofol (N/A, 03/05/2017); Esophagogastroduodenoscopy (egd) with propofol (N/A, 03/30/2017); Transforaminal lumbar interbody fusion (tlif) with pedicle  screw fixation 1 level (N/A, 12/05/2017); Esophagogastroduodenoscopy (egd) with propofol (N/A, 03/17/2019); biopsy (03/17/2019); Bailey Anterior cervical decompression/discectomy fusion 4 level (N/A, 04/09/2019).  Family History:  Her family history includes Breast cancer in her cousin.  Social History:  She  reports that she quit smoking about 6 years ago. Her smoking use included cigarettes. She has a 74.00 pack-year smoking history. She has never used smokeless tobacco. She reports current alcohol use. She reports that she does not use drugs.

## 2019-05-26 ENCOUNTER — Other Ambulatory Visit: Payer: Self-pay

## 2019-05-26 DIAGNOSIS — J449 Chronic obstructive pulmonary disease, unspecified: Secondary | ICD-10-CM

## 2019-05-26 NOTE — Progress Notes (Signed)
Pt called in stating that [place] now [place] and would like sputum cultures.  Order has been placed based off of last OV note. Nothing further is needed.

## 2019-05-29 ENCOUNTER — Inpatient Hospital Stay: Attending: Internal Medicine

## 2019-05-29 ENCOUNTER — Other Ambulatory Visit: Payer: Self-pay

## 2019-05-29 ENCOUNTER — Other Ambulatory Visit: Payer: Self-pay | Admitting: *Deleted

## 2019-05-29 DIAGNOSIS — C7A8 Other malignant neuroendocrine tumors: Secondary | ICD-10-CM

## 2019-05-29 LAB — HEPATIC FUNCTION PANEL
ALT: 19 U/L (ref 0–44)
AST: 19 U/L (ref 15–41)
Albumin: 3.9 g/dL (ref 3.5–5.0)
Alkaline Phosphatase: 80 U/L (ref 38–126)
Bilirubin, Direct: <0.1 mg/dL (ref 0.0–0.2)
Total Bilirubin: 0.6 mg/dL (ref 0.3–1.2)
Total Protein: 6.7 g/dL (ref 6.5–8.1)

## 2019-06-01 ENCOUNTER — Encounter: Payer: Self-pay | Admitting: Internal Medicine

## 2019-06-03 LAB — CHROMOGRANIN A: Chromogranin A (ng/mL): 552.1 ng/mL — ABNORMAL HIGH (ref 0.0–101.8)

## 2019-06-04 ENCOUNTER — Other Ambulatory Visit: Payer: Self-pay | Admitting: Internal Medicine

## 2019-06-05 ENCOUNTER — Other Ambulatory Visit
Admission: RE | Admit: 2019-06-05 | Discharge: 2019-06-05 | Disposition: A | Discharge status: Home / Self Care | Source: Ambulatory Visit | Attending: Pulmonary Disease | Admitting: Pulmonary Disease

## 2019-06-05 DIAGNOSIS — J449 Chronic obstructive pulmonary disease, unspecified: Secondary | ICD-10-CM | POA: Insufficient documentation

## 2019-06-06 LAB — EXPECTORATED SPUTUM ASSESSMENT W GRAM STAIN, RFLX TO RESP C

## 2019-06-08 LAB — ACID FAST SMEAR (AFB, MYCOBACTERIA): Acid Fast Smear: NEGATIVE

## 2019-06-08 LAB — CULTURE, RESPIRATORY W GRAM STAIN: Gram Stain: NONE SEEN

## 2019-06-10 ENCOUNTER — Telehealth: Payer: Self-pay | Admitting: Pulmonary Disease

## 2019-06-10 MED ORDER — SULFAMETHOXAZOLE-TRIMETHOPRIM 800-160 MG PO TABS: 1.0000 | ORAL_TABLET | Freq: Two times a day (BID) | ORAL | 0 refills | Status: DC

## 2019-06-10 NOTE — Telephone Encounter (Signed)
Please let her know sputum culture from 06/05/19 is growing bacteria called Stenotrophomonas maltophilia.     Please send script for Bactrim DS one pill bid for 7 days.  Dispense 14 with no refills.

## 2019-06-10 NOTE — Telephone Encounter (Signed)
Pt is aware of results and voiced her understanding.  Rx for Bactrim has been sent to preferred pharmacy.  Nothing further is needed.

## 2019-06-19 NOTE — Progress Notes (Signed)
Sputum positive for MAC.  Routing to Dr. Mortimer Fries to follow up with patient.

## 2019-06-20 ENCOUNTER — Other Ambulatory Visit: Payer: Self-pay | Admitting: Internal Medicine

## 2019-06-20 DIAGNOSIS — A310 Pulmonary mycobacterial infection: Secondary | ICD-10-CM

## 2019-07-01 ENCOUNTER — Ambulatory Visit (INDEPENDENT_AMBULATORY_CARE_PROVIDER_SITE_OTHER): Admitting: Psychiatry

## 2019-07-01 ENCOUNTER — Other Ambulatory Visit: Payer: Self-pay

## 2019-07-01 ENCOUNTER — Encounter: Payer: Self-pay | Admitting: Psychiatry

## 2019-07-01 DIAGNOSIS — F3131 Bipolar disorder, current episode depressed, mild: Secondary | ICD-10-CM | POA: Diagnosis not present

## 2019-07-01 DIAGNOSIS — F900 Attention-deficit hyperactivity disorder, predominantly inattentive type: Secondary | ICD-10-CM | POA: Diagnosis not present

## 2019-07-01 MED ORDER — LAMOTRIGINE 25 MG PO TABS: 25.0000 mg | ORAL_TABLET | Freq: Every day | ORAL | 1 refills | Status: DC

## 2019-07-01 MED ORDER — LISDEXAMFETAMINE DIMESYLATE 70 MG PO CAPS: 70.0000 mg | ORAL_CAPSULE | Freq: Every day | ORAL | 0 refills | Status: DC

## 2019-07-01 NOTE — Progress Notes (Signed)
Provider Location : [facility] Patient Location : Road   Virtual Visit via Video Note  I connected with [place] on 07/01/19 at  3:40 PM EST by a video enabled telemedicine application and verified that I am speaking with the correct person using two identifiers.   I discussed the limitations of evaluation and management by telemedicine and the availability of in person appointments. The patient expressed understanding and agreed to proceed.    I discussed the assessment and treatment plan with the patient. The patient was provided an opportunity to ask questions and all were answered. The patient agreed with the plan and demonstrated an understanding of the instructions.   The patient was advised to call back or seek an in-person evaluation if the symptoms worsen or if the condition fails to improve as anticipated.   [place] MD OP Progress Note  07/01/2019 4:11 PM Bailey Flores  MRN:  [id number]  Chief Complaint:  Chief Complaint    Follow-up     HPI: Bailey Flores is a 59 year old female, married, unemployed, has a history of bipolar disorder, ADHD, cognitive disorder, COPD, hypertension, chronic pain was evaluated by telemedicine today.  Patient reports she is currently at the side of the road 5 miles away from home since she has a flat tire.  She had gone bike riding with her husband Max.  Patient today reports she is currently recovering from her neck surgery.  She does struggle with pain and takes oxycodone and muscle relaxants.  That does help her.  She reports however recently she has been struggling with mood lability.  She reports she cries too easily and does not know why she does that.  She does not have any significant psychosocial stressors other than her recent surgery as well as her getting ready to relocate to [place] to be closer to her son.  She reports she is currently looking for a home there however has not been able to buy anything yet.  That does make her  stressed out.  Patient continues to have good support system from her husband Max.  She denies any suicidality, homicidality or perceptual disturbances.  She is compliant on medications as prescribed and denies side effects.  Patient denies any other concerns today.  Visit Diagnosis:    ICD-10-CM   1. Bipolar disorder, current episode depressed, mild (HCC)  F31.31 lamoTRIgine (LAMICTAL) 25 MG tablet  2. Attention deficit hyperactivity disorder (ADHD), predominantly inattentive type  F90.0 lisdexamfetamine (VYVANSE) 70 MG capsule    Past Psychiatric History: I have reviewed past psychiatric history from my progress note on 08/10/2017.  Past Medical History:  Past Medical History:  Diagnosis Date  . Arthritis    hands  . Asthma   . Barrett's esophagus   . Cancer [facility])    neuroendocrine ca  . COPD (chronic obstructive pulmonary disease) ([place])   . Depression   . GERD (gastroesophageal reflux disease)   . Hypertension   . Motion sickness    reading in car  . 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 Orthodontics    permanent retainers, top and bottom  . Pneumonia 2017, 2020  . Sleep apnea     Past Surgical History:  Procedure Laterality Date  . ABDOMINAL HYSTERECTOMY    . ANTERIOR CERVICAL DECOMPRESSION/DISCECTOMY FUSION 4 LEVELS N/A 04/09/2019   Procedure: ANTERIOR CERVICAL DECOMPRESSION/DISCECTOMY FUSION 5 LEVELS C3-T1;  Surgeon: Meade Maw, MD;  Location: [facility];  Service: Neurosurgery;  Laterality: N/A;  . BIOPSY  03/17/2019  Procedure: BIOPSY;  Surgeon: [name], MD;  Location: [place];  Service: Endoscopy;;  . BOWEL RESECTION  07/19/2012  . CESAREAN SECTION     x3  . ESOPHAGOGASTRODUODENOSCOPY (EGD) WITH PROPOFOL N/A 03/05/2017   Procedure: ESOPHAGOGASTRODUODENOSCOPY (EGD) WITH PROPOFOL;  Surgeon: [name], MD;  Location: [place];  Service: Endoscopy;  Laterality: N/A;  . ESOPHAGOGASTRODUODENOSCOPY (EGD) WITH PROPOFOL N/A  03/30/2017   REPEAT IN 03/2019  . ESOPHAGOGASTRODUODENOSCOPY (EGD) WITH PROPOFOL N/A 03/17/2019   Procedure: ESOPHAGOGASTRODUODENOSCOPY (EGD) WITH PROPOFOL;  Surgeon: [name], MD;  Location: [place];  Service: Endoscopy;  Laterality: N/A;  . HAND SURGERY Right   . TRANSFORAMINAL LUMBAR INTERBODY FUSION (TLIF) WITH PEDICLE SCREW FIXATION 1 LEVEL N/A 12/05/2017   Procedure: TRANSFORAMINAL LUMBAR INTERBODY FUSION (TLIF) WITH PEDICLE SCREW FIXATION 1 LEVEL-L4-5;  Surgeon: Meade Maw, MD;  Location: [facility];  Service: Neurosurgery;  Laterality: N/A;    Family Psychiatric History: I have reviewed family psychiatric history from my progress note on 08/10/2017.  Family History:  Family History  Problem Relation Age of Onset  . Breast cancer Cousin        mat cousin    Social History: Reviewed social history from my progress note on 08/10/2017. Social History   Socioeconomic History  . Marital status: Married    Spouse name: Not on file  . Number of children: Not on file  . Years of education: Not on file  . Highest education level: Not on file  Occupational History  . Not on file  Tobacco Use  . Smoking status: Former Smoker    Packs/day: 2.00    Years: 37.00    Pack years: 74.00    Types: Cigarettes    Quit date: 07/19/2012    Years since quitting: 6.9  . Smokeless tobacco: Never Used  Substance and Sexual Activity  . Alcohol use: Yes    Comment: 2 drinks/mo  . Drug use: No  . Sexual activity: Yes  Other Topics Concern  . Not on file  Social History Narrative  . Not on file   Social Determinants of Health   Financial Resource Strain:   . Difficulty of Paying Living Expenses: Not on file  Food Insecurity:   . Worried About Charity fundraiser in the Last Year: Not on file  . Ran Out of Food in the Last Year: Not on file  Transportation Needs:   . Lack of Transportation (Medical): Not on file  . Lack of Transportation (Non-Medical): Not on file   Physical Activity:   . Days of Exercise per Week: Not on file  . Minutes of Exercise per Session: Not on file  Stress:   . Feeling of Stress : Not on file  Social Connections:   . Frequency of Communication with Friends and Family: Not on file  . Frequency of Social Gatherings with Friends and Family: Not on file  . Attends Religious Services: Not on file  . Active Member of Clubs or Organizations: Not on file  . Attends Archivist Meetings: Not on file  . Marital Status: Not on file    Allergies: No Known Allergies  Metabolic Disorder Labs: Lab Results  Component Value Date   HGBA1C 5.9 (H) 06/13/2017   MPG 122.63 06/13/2017   Lab Results  Component Value Date   PROLACTIN 4.6 (L) 06/13/2017   Lab Results  Component Value Date   CHOL 183 06/13/2017   TRIG 80 06/13/2017   HDL 92  06/13/2017   CHOLHDL 2.0 06/13/2017   VLDL 16 06/13/2017   LDLCALC 75 06/13/2017   Lab Results  Component Value Date   TSH 1.205 06/13/2017    Therapeutic Level Labs: No results found for: LITHIUM No results found for: VALPROATE No components found for:  CBMZ  Current Medications: Current Outpatient Medications  Medication Sig Dispense Refill  . acetaminophen (TYLENOL) 650 MG CR tablet Take 325 mg by mouth 2 (two) times daily as needed (when taking hydrocodone).    . ADVAIR DISKUS 250-50 MCG/DOSE AEPB USE 1 INHALATION TWICE A DAY (Patient taking differently: Inhale 1 puff into the lungs 2 (two) times daily. ) 180 each 4  . albuterol (PROVENTIL HFA;VENTOLIN HFA) 108 (90 Base) MCG/ACT inhaler Inhale 1-2 puffs into the lungs every 4 (four) hours as needed for wheezing or shortness of breath.     Marland Kitchen aspirin 81 MG chewable tablet Chew 81 mg by mouth daily.    Marland Kitchen buPROPion (WELLBUTRIN SR) 200 MG 12 hr tablet TAKE 1 TABLET TWICE A DAY (Patient taking differently: Take 200 mg by mouth 2 (two) times daily. ) 180 tablet 4  . celecoxib (CELEBREX) 100 MG capsule Take 1 capsule (100 mg total)  by mouth 2 (two) times daily. 60 capsule 0  . Cholecalciferol (VITAMIN D3) 25 MCG (1000 UT) CAPS Take 1,000 Units by mouth at bedtime.     . diphenhydrAMINE (BENADRYL) 25 MG tablet Take 25 mg by mouth daily as needed for allergies.    Marland Kitchen gabapentin (NEURONTIN) 300 MG capsule Take 300 mg 3 (three) times daily by mouth.     Marland Kitchen ipratropium-albuterol (DUONEB) 0.5-2.5 (3) MG/3ML SOLN Inhale 3 mLs into the lungs every 4 (four) hours as needed (Only uses with cold symptoms).     Marland Kitchen lamoTRIgine (LAMICTAL) 100 MG tablet Take 3 tablets (300 mg total) by mouth daily. (Patient taking differently: Take 100-200 mg by mouth See admin instructions. Take 100 mg in the morning and 200 mg at night) 270 tablet 4  . lamoTRIgine (LAMICTAL) 25 MG tablet Take 1-2 tablets (25-50 mg total) by mouth daily. Start taking 1 tablet for 2 weeks and increase to 2 tablets after that ( combine with 300 mg) 60 tablet 1  . lisdexamfetamine (VYVANSE) 70 MG capsule Take 1 capsule (70 mg total) by mouth daily. 90 capsule 0  . magnesium oxide (MAG-OX) 400 MG tablet Take 400-1,200 mg by mouth See admin instructions. Take 400 mg by mouth twice daily unless experiencing constipation then take 800 mg in the afternoon and 1200 mg in the evening    . Magnesium Oxide 500 MG TABS Take by mouth.    . methocarbamol (ROBAXIN) 500 MG tablet Take 2 tablets (1,000 mg total) by mouth every 6 (six) hours. 80 tablet 0  . Milk Thistle 150 MG CAPS Take 150 mg by mouth at bedtime.    . montelukast (SINGULAIR) 10 MG tablet Take 10 mg by mouth at bedtime.     . Multiple Vitamins-Minerals (CENTRUM SILVER PO) Take 1 tablet by mouth daily.    Marland Kitchen omeprazole (PRILOSEC) 40 MG capsule Take 40 mg by mouth daily.     Marland Kitchen oxyCODONE (OXY IR/ROXICODONE) 5 MG immediate release tablet Take 1 tablet (5 mg total) by mouth every 3 (three) hours as needed for moderate pain ((score 4 to 6)). 30 tablet 0  . SPIRIVA RESPIMAT 2.5 MCG/ACT AERS Inhale 2 puffs into the lungs every morning.      . sulfamethoxazole-trimethoprim (BACTRIM DS) 800-160  MG tablet Take 1 tablet by mouth 2 (two) times daily. 14 tablet 0  . valACYclovir (VALTREX) 1000 MG tablet Take 2,000 mg by mouth 2 (two) times daily as needed (herpes simplex).     . valsartan-hydrochlorothiazide (DIOVAN-HCT) 160-12.5 MG tablet Take 1 tablet by mouth at bedtime.      No current facility-administered medications for this visit.     Musculoskeletal: Strength & Muscle Tone: UTA Gait & Station: normal Patient leans: N/A  Psychiatric Specialty Exam: Review of Systems  Musculoskeletal: Positive for neck pain.  Psychiatric/Behavioral: Positive for dysphoric mood. Negative for agitation, behavioral problems, confusion, decreased concentration, hallucinations, self-injury, sleep disturbance and suicidal ideas. The patient is not nervous/anxious and is not hyperactive.        Mood lability  All other systems reviewed and are negative.   There were no vitals taken for this visit.There is no height or weight on file to calculate BMI.  General Appearance: Casual  Eye Contact:  Fair  Speech:  Clear and Coherent  Volume:  Normal  Mood:  Mood lability cries easily  Affect:  Congruent  Thought Process:  Goal Directed and Descriptions of Associations: Intact  Orientation:  Full (Time, Place, and Person)  Thought Content: Logical   Suicidal Thoughts:  No  Homicidal Thoughts:  No  Memory:  Immediate;   Fair Recent;   Fair Remote;   Fair  Judgement:  Fair  Insight:  Fair  Psychomotor Activity:  Normal  Concentration:  Concentration: Fair and Attention Span: Fair  Recall:  [employer] of Knowledge: Fair  Language: Fair  Akathisia:  No  Handed:  Right  AIMS (if indicated): denies tremors   Assets:  Communication Skills Desire for Improvement Housing Intimacy Social Support  ADL's:  Intact  Cognition: WNL  Sleep:  Fair   Screenings: PHQ2-9     Pulmonary Rehab from 06/25/2017 in [facility]   PHQ-2 Total Score  4  PHQ-9 Total Score  19       Assessment and Plan: Arora is a 59 year old Hispanic female who has a history of bipolar disorder, ADHD, cognitive disorder, COPD, GERD was evaluated by telemedicine today.  She has psychosocial stressors of relationship struggles, her own health issues, recent surgery and upcoming relocation to [place].  Patient is currently struggling with mood lability and will benefit from medication readjustment.  Plan as noted below.  Plan Bipolar disorder most recent depressed, mild-unstable Increase Lamictal to 325 mg p.o. daily for 2 weeks and then to Lamictal 350 mg after that. Wellbutrin SR 200 mg p.o. twice daily Offered referral for psychotherapy-patient declines.  Anxiety disorder-stable Hydroxyzine 25 mg p.o. daily as needed  ADHD-stable Vyvanse 70 mg p.o. daily I have reviewed [place] controlled substance database.  Follow-up in clinic in 2 to 4 weeks or sooner if needed.  March 11 at 3 PM  I have spent atleast 20 minutes non face to face with patient today. More than 50 % of the time was spent for ordering medications and test ,psychoeducation and supportive psychotherapy and care coordination,as well as documenting clinical information in electronic health record. This note was generated in part or whole with voice recognition software. Voice recognition is usually quite accurate but there are transcription errors that can and very often do occur. I apologize for any typographical errors that were not detected and corrected.      Bailey Alert, MD 07/01/2019, 4:11 PM

## 2019-07-02 LAB — AFB ORGANISM ID BY DNA PROBE
M avium complex: POSITIVE — AB
M tuberculosis complex: NEGATIVE

## 2019-07-02 LAB — MAC SUSCEPTIBILITY BROTH
Amikacin: 8
Clarithromycin: 0.5
Moxifloxacin: 1
Streptomycin: 16

## 2019-07-02 LAB — ACID FAST CULTURE WITH REFLEXED SENSITIVITIES (MYCOBACTERIA): Acid Fast Culture: POSITIVE — AB

## 2019-07-26 ENCOUNTER — Other Ambulatory Visit: Payer: Self-pay | Admitting: Internal Medicine

## 2019-07-28 ENCOUNTER — Inpatient Hospital Stay: Admitting: Internal Medicine

## 2019-07-28 ENCOUNTER — Inpatient Hospital Stay

## 2019-07-31 ENCOUNTER — Encounter: Payer: Self-pay | Admitting: Psychiatry

## 2019-07-31 ENCOUNTER — Ambulatory Visit (INDEPENDENT_AMBULATORY_CARE_PROVIDER_SITE_OTHER): Admitting: Psychiatry

## 2019-07-31 ENCOUNTER — Other Ambulatory Visit: Payer: Self-pay

## 2019-07-31 DIAGNOSIS — F3131 Bipolar disorder, current episode depressed, mild: Secondary | ICD-10-CM

## 2019-07-31 DIAGNOSIS — F900 Attention-deficit hyperactivity disorder, predominantly inattentive type: Secondary | ICD-10-CM

## 2019-07-31 NOTE — Progress Notes (Signed)
Provider Location : [facility] Patient Location : Home  Virtual Visit via Video Note  I connected with [place] on 07/31/19 at  3:00 PM EST by a video enabled telemedicine application and verified that I am speaking with the correct person using two identifiers.   I discussed the limitations of evaluation and management by telemedicine and the availability of in person appointments. The patient expressed understanding and agreed to proceed.    I discussed the assessment and treatment plan with the patient. The patient was provided an opportunity to ask questions and all were answered. The patient agreed with the plan and demonstrated an understanding of the instructions.   The patient was advised to call back or seek an in-person evaluation if the symptoms worsen or if the condition fails to improve as anticipated.   [place] MD OP Progress Note  07/31/2019 3:57 PM Jaclin Finks  MRN:  [id number]  Chief Complaint:  Chief Complaint    Follow-up     HPI: Bailey Flores is a 59 year old female, married, unemployed, has a history of bipolar disorder, ADHD, cognitive disorder, COPD, hypertension, chronic pain was evaluated by telemedicine today.  Patient today reports she was recently diagnosed with a lung infection .  She reports she is relieved that she cannot infect anyone else around her.  She reports she is going to start rifampin on March 24, prescribed by her pulmonologist.  She reports she wanted to discuss the interaction of rifampin with her medications which she is taking for her mood symptoms.  She reports overall her mood symptoms are improving.  She does feel anxious about her situational stressors of current health issues and the current pandemic.  However overall she is making progress on the current dosage of medications including the recent readjustment of Lamictal.  She has not yet gone up to 350 mg of the Lamictal and is currently on the 325 mg.  She is agreeable to  increasing the dosage.  She reports sleep is better now that her neck pain is better.  She recovered well from her neck surgery.  Patient denies any suicidality, homicidality or perceptual disturbances.  Patient reports her husband continues to be supportive.  She had been previously thinking about moving to [place] to be closer to her son however she currently reports she is not planning on doing that anymore and is going to stay back in [place].  She is not interested in psychotherapy sessions and would like to just make use of her husband's support system for now and take rides on his bike which are kind of therapeutic for her.  Visit Diagnosis:    ICD-10-CM   1. Bipolar disorder, current episode depressed, mild ([place])  F31.31   2. Attention deficit hyperactivity disorder (ADHD), predominantly inattentive type  F90.0     Past Psychiatric History: I have reviewed past psychiatric history from my progress note on 08/10/2017  Past Medical History:  Past Medical History:  Diagnosis Date  . Arthritis    hands  . Asthma   . Barrett's esophagus   . Cancer [facility])    neuroendocrine ca  . COPD (chronic obstructive pulmonary disease) ([place])   . Depression   . GERD (gastroesophageal reflux disease)   . Hypertension   . Motion sickness    reading in car  . 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 Orthodontics    permanent retainers, top and bottom  . Pneumonia 2017, 2020  . Sleep apnea     Past  Surgical History:  Procedure Laterality Date  . ABDOMINAL HYSTERECTOMY    . ANTERIOR CERVICAL DECOMPRESSION/DISCECTOMY FUSION 4 LEVELS N/A 04/09/2019   Procedure: ANTERIOR CERVICAL DECOMPRESSION/DISCECTOMY FUSION 5 LEVELS C3-T1;  Surgeon: Meade Maw, MD;  Location: [facility];  Service: Neurosurgery;  Laterality: N/A;  . BIOPSY  03/17/2019   Procedure: BIOPSY;  Surgeon: [name], MD;  Location: [place];  Service: Endoscopy;;  . BOWEL RESECTION  07/19/2012  .  CESAREAN SECTION     x3  . ESOPHAGOGASTRODUODENOSCOPY (EGD) WITH PROPOFOL N/A 03/05/2017   Procedure: ESOPHAGOGASTRODUODENOSCOPY (EGD) WITH PROPOFOL;  Surgeon: [name], MD;  Location: [place];  Service: Endoscopy;  Laterality: N/A;  . ESOPHAGOGASTRODUODENOSCOPY (EGD) WITH PROPOFOL N/A 03/30/2017   REPEAT IN 03/2019  . ESOPHAGOGASTRODUODENOSCOPY (EGD) WITH PROPOFOL N/A 03/17/2019   Procedure: ESOPHAGOGASTRODUODENOSCOPY (EGD) WITH PROPOFOL;  Surgeon: [name], MD;  Location: [place];  Service: Endoscopy;  Laterality: N/A;  . HAND SURGERY Right   . TRANSFORAMINAL LUMBAR INTERBODY FUSION (TLIF) WITH PEDICLE SCREW FIXATION 1 LEVEL N/A 12/05/2017   Procedure: TRANSFORAMINAL LUMBAR INTERBODY FUSION (TLIF) WITH PEDICLE SCREW FIXATION 1 LEVEL-L4-5;  Surgeon: Meade Maw, MD;  Location: [facility];  Service: Neurosurgery;  Laterality: N/A;    Family Psychiatric History: Reviewed family psychiatric history from my progress note on 08/10/2017 Family History:  Family History  Problem Relation Age of Onset  . Breast cancer Cousin        mat cousin    Social History: Reviewed social history from my progress note on 08/10/2017 Social History   Socioeconomic History  . Marital status: Married    Spouse name: Not on file  . Number of children: Not on file  . Years of education: Not on file  . Highest education level: Not on file  Occupational History  . Not on file  Tobacco Use  . Smoking status: Former Smoker    Packs/day: 2.00    Years: 37.00    Pack years: 74.00    Types: Cigarettes    Quit date: 07/19/2012    Years since quitting: 7.0  . Smokeless tobacco: Never Used  Substance and Sexual Activity  . Alcohol use: Yes    Comment: 2 drinks/mo  . Drug use: No  . Sexual activity: Yes  Other Topics Concern  . Not on file  Social History Narrative  . Not on file   Social Determinants of Health   Financial Resource Strain:   . Difficulty of Paying  Living Expenses:   Food Insecurity:   . Worried About Charity fundraiser in the Last Year:   . Arboriculturist in the Last Year:   Transportation Needs:   . Film/video editor (Medical):   Marland Kitchen Lack of Transportation (Non-Medical):   Physical Activity:   . Days of Exercise per Week:   . Minutes of Exercise per Session:   Stress:   . Feeling of Stress :   Social Connections:   . Frequency of Communication with Friends and Family:   . Frequency of Social Gatherings with Friends and Family:   . Attends Religious Services:   . Active Member of Clubs or Organizations:   . Attends Archivist Meetings:   Marland Kitchen Marital Status:     Allergies: No Known Allergies  Metabolic Disorder Labs: Lab Results  Component Value Date   HGBA1C 5.9 (H) 06/13/2017   MPG 122.63 06/13/2017   Lab Results  Component Value Date   PROLACTIN 4.6 (L)  06/13/2017   Lab Results  Component Value Date   CHOL 183 06/13/2017   TRIG 80 06/13/2017   HDL 92 06/13/2017   CHOLHDL 2.0 06/13/2017   VLDL 16 06/13/2017   LDLCALC 75 06/13/2017   Lab Results  Component Value Date   TSH 1.205 06/13/2017    Therapeutic Level Labs: No results found for: LITHIUM No results found for: VALPROATE No components found for:  CBMZ  Current Medications: Current Outpatient Medications  Medication Sig Dispense Refill  . azithromycin (ZITHROMAX) 500 MG tablet Take by mouth.    . ethambutol (MYAMBUTOL) 400 MG tablet Take by mouth.    . rifampin (RIFADIN) 300 MG capsule Take by mouth.    Marland Kitchen acetaminophen (TYLENOL) 650 MG CR tablet Take 325 mg by mouth 2 (two) times daily as needed (when taking hydrocodone).    . ADVAIR DISKUS 250-50 MCG/DOSE AEPB USE 1 INHALATION TWICE A DAY 180 each 3  . albuterol (PROVENTIL HFA;VENTOLIN HFA) 108 (90 Base) MCG/ACT inhaler Inhale 1-2 puffs into the lungs every 4 (four) hours as needed for wheezing or shortness of breath.     Marland Kitchen aspirin 81 MG chewable tablet Chew 81 mg by mouth daily.     Marland Kitchen buPROPion (WELLBUTRIN SR) 200 MG 12 hr tablet TAKE 1 TABLET TWICE A DAY (Patient taking differently: Take 200 mg by mouth 2 (two) times daily. ) 180 tablet 4  . celecoxib (CELEBREX) 100 MG capsule Take 1 capsule (100 mg total) by mouth 2 (two) times daily. 60 capsule 0  . Cholecalciferol (VITAMIN D3) 25 MCG (1000 UT) CAPS Take 1,000 Units by mouth at bedtime.     . diphenhydrAMINE (BENADRYL) 25 MG tablet Take 25 mg by mouth daily as needed for allergies.    Marland Kitchen ethambutol (MYAMBUTOL) 400 MG tablet     . gabapentin (NEURONTIN) 300 MG capsule Take 300 mg 3 (three) times daily by mouth.     Marland Kitchen ipratropium-albuterol (DUONEB) 0.5-2.5 (3) MG/3ML SOLN Inhale 3 mLs into the lungs every 4 (four) hours as needed (Only uses with cold symptoms).     Marland Kitchen lamoTRIgine (LAMICTAL) 100 MG tablet Take 3 tablets (300 mg total) by mouth daily. (Patient taking differently: Take 100-200 mg by mouth See admin instructions. Take 100 mg in the morning and 200 mg at night) 270 tablet 4  . lamoTRIgine (LAMICTAL) 25 MG tablet Take 1-2 tablets (25-50 mg total) by mouth daily. Start taking 1 tablet for 2 weeks and increase to 2 tablets after that ( combine with 300 mg) 60 tablet 1  . lisdexamfetamine (VYVANSE) 70 MG capsule Take 1 capsule (70 mg total) by mouth daily. 90 capsule 0  . magnesium oxide (MAG-OX) 400 MG tablet Take 400-1,200 mg by mouth See admin instructions. Take 400 mg by mouth twice daily unless experiencing constipation then take 800 mg in the afternoon and 1200 mg in the evening    . Magnesium Oxide 500 MG TABS Take by mouth.    . methocarbamol (ROBAXIN) 500 MG tablet Take 2 tablets (1,000 mg total) by mouth every 6 (six) hours. 80 tablet 0  . Milk Thistle 150 MG CAPS Take 150 mg by mouth at bedtime.    . montelukast (SINGULAIR) 10 MG tablet Take 10 mg by mouth at bedtime.     . Multiple Vitamins-Minerals (CENTRUM SILVER PO) Take 1 tablet by mouth daily.    Marland Kitchen omeprazole (PRILOSEC) 40 MG capsule Take 40 mg by  mouth daily.     Marland Kitchen oxyCODONE (  OXY IR/ROXICODONE) 5 MG immediate release tablet Take 1 tablet (5 mg total) by mouth every 3 (three) hours as needed for moderate pain ((score 4 to 6)). 30 tablet 0  . rifampin (RIFADIN) 300 MG capsule     . SPIRIVA RESPIMAT 2.5 MCG/ACT AERS Inhale 2 puffs into the lungs every morning.     . sulfamethoxazole-trimethoprim (BACTRIM DS) 800-160 MG tablet Take 1 tablet by mouth 2 (two) times daily. 14 tablet 0  . valACYclovir (VALTREX) 1000 MG tablet Take 2,000 mg by mouth 2 (two) times daily as needed (herpes simplex).     . valsartan-hydrochlorothiazide (DIOVAN-HCT) 160-12.5 MG tablet Take 1 tablet by mouth at bedtime.     Marland Kitchen ZITHROMAX 500 MG tablet      No current facility-administered medications for this visit.     Musculoskeletal: Strength & Muscle Tone: UTA Gait & Station: normal Patient leans: N/A  Psychiatric Specialty Exam: Review of Systems  Respiratory: Positive for cough.   Musculoskeletal: Positive for neck pain.  Psychiatric/Behavioral: The patient is nervous/anxious.   All other systems reviewed and are negative.   There were no vitals taken for this visit.There is no height or weight on file to calculate BMI.  General Appearance: Casual  Eye Contact:  Fair  Speech:  Clear and Coherent  Volume:  Normal  Mood:  Anxious  Affect:  Congruent  Thought Process:  Goal Directed and Descriptions of Associations: Intact  Orientation:  Full (Time, Place, and Person)  Thought Content: Logical   Suicidal Thoughts:  No  Homicidal Thoughts:  No  Memory:  Immediate;   Fair Recent;   Fair Remote;   Fair  Judgement:  Fair  Insight:  Fair  Psychomotor Activity:  Restlessness  Concentration:  Concentration: Fair and Attention Span: Fair  Recall:  [employer] of Knowledge: Fair  Language: Fair  Akathisia:  No  Handed:  Right  AIMS (if indicated): UTA  Assets:  Communication Skills Desire for Improvement Housing Intimacy Social  Support Talents/Skills  ADL's:  Intact  Cognition: WNL  Sleep:  Fair   Screenings: PHQ2-9     Pulmonary Rehab from 06/25/2017 [facility]  PHQ-2 Total Score  4  PHQ-9 Total Score  19       Assessment and Plan: Shandria is a 59 year old Hispanic female, has a history of bipolar disorder, ADHD, cognitive disorder, COPD, GERD was evaluated by telemedicine today.  Patient with psychosocial stressors of relationship struggles, her own health issues, recent surgery is currently struggling with new health problems which are making her anxious.  She however has been coping okay ,will continue to benefit from medication readjustment.  Plan as noted below.  Plan Bipolar disorder most recent depressed, mild-improving She is currently on Lamictal 325 mg p.o. daily. Recommended patient  increase Lamictal to 350 mg p.o. daily. Wellbutrin SR 200 mg p.o. twice daily Offered psychotherapy sessions-she declined.   Anxiety disorder-stable She can continue to make use of hydroxyzine 25 mg p.o. daily as needed  ADHD-stable Vyvanse 70 mg p.o. daily. I have reviewed [place] controlled substance database.  Discussed with patient drug to drug interaction between rifampin and her medications like Lamictal and Wellbutrin.  Rifampin cannot reduce the serum concentration of Lamictal and Wellbutrin.  She is currently being readjusted to a higher dosage of Lamictal.  Wellbutrin is at the maximum dosage at this time.  Discussed with patient to monitor her symptoms closely for further medication management.  I have reviewed medical  records in [facility] per Dr.Sood , Dr.Kasa- " patient with diagnosis of Mycobacterium avium complex:.  Follow-up in clinic in 5 weeks or sooner if needed.  I have spent atleast 20 minutes non face to face with patient today. More than 50 % of the time was spent for preparing to see the patient ( e.g., review of test, records ), obtaining and to review and separately  obtained history , ordering medications and test ,psychoeducation and supportive psychotherapy and care coordination,as well as documenting clinical information in electronic health record. This note was generated in part or whole with voice recognition software. Voice recognition is usually quite accurate but there are transcription errors that can and very often do occur. I apologize for any typographical errors that were not detected and corrected.      [name], MD 07/31/2019, 3:57 PM

## 2019-08-31 ENCOUNTER — Other Ambulatory Visit: Payer: Self-pay | Admitting: Psychiatry

## 2019-08-31 DIAGNOSIS — F3131 Bipolar disorder, current episode depressed, mild: Secondary | ICD-10-CM

## 2019-09-01 MED ORDER — LAMOTRIGINE 25 MG PO TABS: 50.0000 mg | ORAL_TABLET | Freq: Every day | ORAL | 1 refills | Status: DC

## 2019-09-01 NOTE — Telephone Encounter (Signed)
I have sent Lamictal 50 mg to pharmacy.

## 2019-09-04 ENCOUNTER — Encounter: Payer: Self-pay | Admitting: Psychiatry

## 2019-09-04 ENCOUNTER — Ambulatory Visit (INDEPENDENT_AMBULATORY_CARE_PROVIDER_SITE_OTHER): Admitting: Psychiatry

## 2019-09-04 ENCOUNTER — Other Ambulatory Visit: Payer: Self-pay

## 2019-09-04 DIAGNOSIS — F3131 Bipolar disorder, current episode depressed, mild: Secondary | ICD-10-CM | POA: Diagnosis not present

## 2019-09-04 DIAGNOSIS — F900 Attention-deficit hyperactivity disorder, predominantly inattentive type: Secondary | ICD-10-CM | POA: Diagnosis not present

## 2019-09-04 MED ORDER — LAMOTRIGINE 200 MG PO TABS: 200.0000 mg | ORAL_TABLET | Freq: Two times a day (BID) | ORAL | 0 refills | Status: DC

## 2019-09-04 MED ORDER — TRAZODONE HCL 50 MG PO TABS: 25.0000 mg | ORAL_TABLET | Freq: Every evening | ORAL | 0 refills | Status: DC | PRN

## 2019-09-04 MED ORDER — BUPROPION HCL ER (SR) 200 MG PO TB12: 200.0000 mg | ORAL_TABLET | Freq: Two times a day (BID) | ORAL | 0 refills | Status: DC

## 2019-09-04 NOTE — Progress Notes (Signed)
Provider Location : [facility] Patient Location : Home  Virtual Visit via Video Note  I connected with Bailey Flores on 09/04/19 at  1:00 PM EDT by a video enabled telemedicine application and verified that I am speaking with the correct person using two identifiers.   I discussed the limitations of evaluation and management by telemedicine and the availability of in person appointments. The patient expressed understanding and agreed to proceed.    I discussed the assessment and treatment plan with the patient. The patient was provided an opportunity to ask questions and all were answered. The patient agreed with the plan and demonstrated an understanding of the instructions.   The patient was advised to call back or seek an in-person evaluation if the symptoms worsen or if the condition fails to improve as anticipated.  [place] MD OP Progress Note  09/04/2019 6:05 PM Bailey Flores  MRN:  [id number]  Chief Complaint:  Chief Complaint    Follow-up     HPI: Bailey Flores is a 59 year old female, married, unemployed, has a history of bipolar disorder, ADHD, cognitive disorder, COPD, hypertension, chronic pain was evaluated by telemedicine today.  Patient today reports she is currently struggling with irritability, mood lability, fatigue during the day and sleep problems at night.  This has been getting worse since the past few weeks.  Patient is currently on rifampin per pulmonologist.  She was also started on ethambutol however that had to be discontinued due to side effects.  Patient continues to have physical problems which does have an impact on her mood as well.  She denies any suicidality, homicidality or perceptual disturbances.  Patient is compliant on her medications and denies side effects.  Patient denies any other concerns today. Visit Diagnosis:    ICD-10-CM   1. Bipolar disorder, current episode depressed, mild (HCC)  F31.31 traZODone (DESYREL) 50 MG tablet     lamoTRIgine (LAMICTAL) 200 MG tablet    DISCONTINUED: lamoTRIgine (LAMICTAL) 200 MG tablet  2. Attention deficit hyperactivity disorder (ADHD), predominantly inattentive type  F90.0 buPROPion (WELLBUTRIN SR) 200 MG 12 hr tablet    Past Psychiatric History: I have reviewed past psychiatric history from my progress note on 08/10/2017  Past Medical History:  Past Medical History:  Diagnosis Date  . Arthritis    hands  . Asthma   . Barrett's esophagus   . Cancer [facility])    neuroendocrine ca  . COPD (chronic obstructive pulmonary disease) ([place])   . Depression   . GERD (gastroesophageal reflux disease)   . Hypertension   . Motion sickness    reading in car  . 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 Orthodontics    permanent retainers, top and bottom  . Pneumonia 2017, 2020  . Sleep apnea     Past Surgical History:  Procedure Laterality Date  . ABDOMINAL HYSTERECTOMY    . ANTERIOR CERVICAL DECOMPRESSION/DISCECTOMY FUSION 4 LEVELS N/A 04/09/2019   Procedure: ANTERIOR CERVICAL DECOMPRESSION/DISCECTOMY FUSION 5 LEVELS C3-T1;  Surgeon: Meade Maw, MD;  Location: [facility];  Service: Neurosurgery;  Laterality: N/A;  . BIOPSY  03/17/2019   Procedure: BIOPSY;  Surgeon: [name], MD;  Location: [place];  Service: Endoscopy;;  . BOWEL RESECTION  07/19/2012  . CESAREAN SECTION     x3  . ESOPHAGOGASTRODUODENOSCOPY (EGD) WITH PROPOFOL N/A 03/05/2017   Procedure: ESOPHAGOGASTRODUODENOSCOPY (EGD) WITH PROPOFOL;  Surgeon: [name], MD;  Location: [place];  Service: Endoscopy;  Laterality: N/A;  . ESOPHAGOGASTRODUODENOSCOPY (EGD) WITH PROPOFOL N/A  03/30/2017   REPEAT IN 03/2019  . ESOPHAGOGASTRODUODENOSCOPY (EGD) WITH PROPOFOL N/A 03/17/2019   Procedure: ESOPHAGOGASTRODUODENOSCOPY (EGD) WITH PROPOFOL;  Surgeon: [name], MD;  Location: [name];  Service: Endoscopy;  Laterality: N/A;  . HAND SURGERY Right   . TRANSFORAMINAL LUMBAR INTERBODY  FUSION (TLIF) WITH PEDICLE SCREW FIXATION 1 LEVEL N/A 12/05/2017   Procedure: TRANSFORAMINAL LUMBAR INTERBODY FUSION (TLIF) WITH PEDICLE SCREW FIXATION 1 LEVEL-L4-5;  Surgeon: Meade Maw, MD;  Location: [facility];  Service: Neurosurgery;  Laterality: N/A;    Family Psychiatric History: I have reviewed family psychiatric history from my progress note on 08/10/2017  Family History:  Family History  Problem Relation Age of Onset  . Breast cancer Cousin        mat cousin    Social History: I have reviewed social history from my progress note on 08/10/2017 Social History   Socioeconomic History  . Marital status: Married    Spouse name: Not on file  . Number of children: Not on file  . Years of education: Not on file  . Highest education level: Not on file  Occupational History  . Not on file  Tobacco Use  . Smoking status: Former Smoker    Packs/day: 2.00    Years: 37.00    Pack years: 74.00    Types: Cigarettes    Quit date: 07/19/2012    Years since quitting: 7.1  . Smokeless tobacco: Never Used  Substance and Sexual Activity  . Alcohol use: Yes    Comment: 2 drinks/mo  . Drug use: No  . Sexual activity: Yes  Other Topics Concern  . Not on file  Social History Narrative  . Not on file   Social Determinants of Health   Financial Resource Strain:   . Difficulty of Paying Living Expenses:   Food Insecurity:   . Worried About Charity fundraiser in the Last Year:   . Arboriculturist in the Last Year:   Transportation Needs:   . Film/video editor (Medical):   Marland Kitchen Lack of Transportation (Non-Medical):   Physical Activity:   . Days of Exercise per Week:   . Minutes of Exercise per Session:   Stress:   . Feeling of Stress :   Social Connections:   . Frequency of Communication with Friends and Family:   . Frequency of Social Gatherings with Friends and Family:   . Attends Religious Services:   . Active Member of Clubs or Organizations:   . Attends Theatre manager Meetings:   Marland Kitchen Marital Status:     Allergies: No Known Allergies  Metabolic Disorder Labs: Lab Results  Component Value Date   HGBA1C 5.9 (H) 06/13/2017   MPG 122.63 06/13/2017   Lab Results  Component Value Date   PROLACTIN 4.6 (L) 06/13/2017   Lab Results  Component Value Date   CHOL 183 06/13/2017   TRIG 80 06/13/2017   HDL 92 06/13/2017   CHOLHDL 2.0 06/13/2017   VLDL 16 06/13/2017   LDLCALC 75 06/13/2017   Lab Results  Component Value Date   TSH 1.205 06/13/2017    Therapeutic Level Labs: No results found for: LITHIUM No results found for: VALPROATE No components found for:  CBMZ  Current Medications: Current Outpatient Medications  Medication Sig Dispense Refill  . azithromycin (ZITHROMAX) 500 MG tablet Take by mouth.    Marland Kitchen acetaminophen (TYLENOL) 650 MG CR tablet Take 325 mg by mouth 2 (two) times daily as needed (when  taking hydrocodone).    . ADVAIR DISKUS 250-50 MCG/DOSE AEPB USE 1 INHALATION TWICE A DAY 180 each 3  . albuterol (PROVENTIL HFA;VENTOLIN HFA) 108 (90 Base) MCG/ACT inhaler Inhale 1-2 puffs into the lungs every 4 (four) hours as needed for wheezing or shortness of breath.     Marland Kitchen aspirin 81 MG chewable tablet Chew 81 mg by mouth daily.    Marland Kitchen azithromycin (ZITHROMAX) 500 MG tablet Take by mouth.    Marland Kitchen buPROPion (WELLBUTRIN SR) 200 MG 12 hr tablet Take 1 tablet (200 mg total) by mouth 2 (two) times daily. 180 tablet 0  . celecoxib (CELEBREX) 100 MG capsule Take 1 capsule (100 mg total) by mouth 2 (two) times daily. 60 capsule 0  . Cholecalciferol (VITAMIN D3) 25 MCG (1000 UT) CAPS Take 1,000 Units by mouth at bedtime.     . diphenhydrAMINE (BENADRYL) 25 MG tablet Take 25 mg by mouth daily as needed for allergies.    Marland Kitchen ethambutol (MYAMBUTOL) 400 MG tablet     . ethambutol (MYAMBUTOL) 400 MG tablet Take by mouth.    . gabapentin (NEURONTIN) 300 MG capsule Take 300 mg 3 (three) times daily by mouth.     Marland Kitchen ipratropium-albuterol (DUONEB)  0.5-2.5 (3) MG/3ML SOLN Inhale 3 mLs into the lungs every 4 (four) hours as needed (Only uses with cold symptoms).     Marland Kitchen lamoTRIgine (LAMICTAL) 200 MG tablet Take 1 tablet (200 mg total) by mouth 2 (two) times daily. 180 tablet 0  . lisdexamfetamine (VYVANSE) 70 MG capsule Take 1 capsule (70 mg total) by mouth daily. 90 capsule 0  . magnesium oxide (MAG-OX) 400 MG tablet Take 400-1,200 mg by mouth See admin instructions. Take 400 mg by mouth twice daily unless experiencing constipation then take 800 mg in the afternoon and 1200 mg in the evening    . Magnesium Oxide 500 MG TABS Take by mouth.    . methocarbamol (ROBAXIN) 500 MG tablet Take 2 tablets (1,000 mg total) by mouth every 6 (six) hours. 80 tablet 0  . Milk Thistle 150 MG CAPS Take 150 mg by mouth at bedtime.    . montelukast (SINGULAIR) 10 MG tablet Take 10 mg by mouth at bedtime.     . Multiple Vitamins-Minerals (CENTRUM SILVER PO) Take 1 tablet by mouth daily.    Marland Kitchen omeprazole (PRILOSEC) 40 MG capsule Take 40 mg by mouth daily.     Marland Kitchen oxyCODONE (OXY IR/ROXICODONE) 5 MG immediate release tablet Take 1 tablet (5 mg total) by mouth every 3 (three) hours as needed for moderate pain ((score 4 to 6)). 30 tablet 0  . rifampin (RIFADIN) 300 MG capsule     . rifampin (RIFADIN) 300 MG capsule Take by mouth.    . SPIRIVA RESPIMAT 2.5 MCG/ACT AERS Inhale 2 puffs into the lungs every morning.     . sulfamethoxazole-trimethoprim (BACTRIM DS) 800-160 MG tablet Take 1 tablet by mouth 2 (two) times daily. 14 tablet 0  . traZODone (DESYREL) 50 MG tablet Take 0.5-1 tablets (25-50 mg total) by mouth at bedtime as needed for sleep. 90 tablet 0  . valACYclovir (VALTREX) 1000 MG tablet Take 2,000 mg by mouth 2 (two) times daily as needed (herpes simplex).     . valsartan-hydrochlorothiazide (DIOVAN-HCT) 160-12.5 MG tablet Take 1 tablet by mouth at bedtime.     Marland Kitchen ZITHROMAX 500 MG tablet      No current facility-administered medications for this visit.      Musculoskeletal: Strength & Muscle  Tone: [place]: normal Patient leans: N/A  Psychiatric Specialty Exam: Review of Systems  Constitutional: Positive for fatigue.  Musculoskeletal: Positive for myalgias.  Psychiatric/Behavioral: Positive for sleep disturbance. The patient is nervous/anxious.        Irritability  All other systems reviewed and are negative.   There were no vitals taken for this visit.There is no height or weight on file to calculate BMI.  General Appearance: Casual  Eye Contact:  Fair  Speech:  Clear and Coherent  Volume:  Normal  Mood:  Anxious  Affect:  Congruent  Thought Process:  Goal Directed and Descriptions of Associations: Intact  Orientation:  Full (Time, Place, and Person)  Thought Content: Logical   Suicidal Thoughts:  No  Homicidal Thoughts:  No  Memory:  Immediate;   Fair Recent;   Fair Remote;   Fair  Judgement:  Fair  Insight:  Fair  Psychomotor Activity:  Normal  Concentration:  Concentration: Fair and Attention Span: Fair  Recall:  [employer] of Knowledge: Fair  Language: Fair  Akathisia:  No  Handed:  Right  AIMS (if indicated):UTA  Assets:  Communication Skills Desire for Improvement Social Support  ADL's:  Intact  Cognition: WNL  Sleep:  Poor   Screenings: PHQ2-9     Pulmonary Rehab from 06/25/2017 in [facility]  PHQ-2 Total Score  4  PHQ-9 Total Score  19       Assessment and Plan: Bailey Flores is a 59 year old Hispanic female who has a history of bipolar disorder, ADHD, cognitive disorder, COPD, GERD was evaluated by telemedicine today.  Patient with psychosocial stressors of relationship struggles, her own health issues, recent surgery, lung infection currently on rifampin and azithromycin, was evaluated by telemedicine today.  Patient is currently struggling with mood lability and sleep issues, likely due to her medication interaction with rifampin and also her physical symptoms of pain.   Patient will benefit from medication readjustment and psychotherapy sessions.  Plan Bipolar disorder most recent depressed, mild-unstable Increase Lamictal to 375 mg p.o. daily for the next 2 weeks and then to 200 mg p.o. twice daily-total of 400 mg p.o. daily after that. Continue Wellbutrin SR 200 mg p.o. twice daily Add trazodone 25 to 50 mg p.o. nightly as needed Patient was offered psychotherapy sessions-declined.  Anxiety disorder-unstable We will monitor closely. Continue hydroxyzine 25 mg p.o. daily as needed Lamictal dosage has been increased.  ADHD-stable Vyvanse 70 mg p.o. daily I have reviewed [place] controlled substance database.  Discussed with patient drug to drug interaction between rifampin and her medications like Lamictal and Wellbutrin.  Rifampin can reduce the serum concentration of Lamictal and Wellbutrin.  Her Lamictal dosage is currently being increased.  Follow-up in clinic in 2 to 3 weeks or sooner if needed.  I have spent atleast 30 minutes non face to face with patient today. More than 50 % of the time was spent for preparing to see the patient ( e.g., review of test, records ), obtaining and to review and separately obtained history , ordering medications and test ,psychoeducation and supportive psychotherapy and care coordination,as well as documenting clinical information in electronic health record. This note was generated in part or whole with voice recognition software. Voice recognition is usually quite accurate but there are transcription errors that can and very often do occur. I apologize for any typographical errors that were not detected and corrected.     [name], MD 09/04/2019, 6:05 PM

## 2019-09-25 ENCOUNTER — Other Ambulatory Visit: Payer: Self-pay | Admitting: Orthopedic Surgery

## 2019-09-25 ENCOUNTER — Telehealth: Payer: Self-pay

## 2019-09-25 DIAGNOSIS — F900 Attention-deficit hyperactivity disorder, predominantly inattentive type: Secondary | ICD-10-CM

## 2019-09-25 DIAGNOSIS — M19011 Primary osteoarthritis, right shoulder: Secondary | ICD-10-CM

## 2019-09-25 NOTE — Telephone Encounter (Signed)
pt called left message that she needs a refill on her vyvanse

## 2019-09-26 MED ORDER — LISDEXAMFETAMINE DIMESYLATE 70 MG PO CAPS: 70.0000 mg | ORAL_CAPSULE | Freq: Every day | ORAL | 0 refills | Status: DC

## 2019-09-26 NOTE — Telephone Encounter (Signed)
I have sent Vyvanse to her pharmacy at [place] home delivery.

## 2019-09-29 ENCOUNTER — Ambulatory Visit: Admitting: Pulmonary Disease

## 2019-10-02 ENCOUNTER — Telehealth (INDEPENDENT_AMBULATORY_CARE_PROVIDER_SITE_OTHER): Admitting: Psychiatry

## 2019-10-02 ENCOUNTER — Other Ambulatory Visit: Payer: Self-pay

## 2019-10-02 ENCOUNTER — Encounter: Payer: Self-pay | Admitting: Psychiatry

## 2019-10-02 DIAGNOSIS — R413 Other amnesia: Secondary | ICD-10-CM | POA: Diagnosis not present

## 2019-10-02 DIAGNOSIS — F900 Attention-deficit hyperactivity disorder, predominantly inattentive type: Secondary | ICD-10-CM | POA: Diagnosis not present

## 2019-10-02 DIAGNOSIS — F3175 Bipolar disorder, in partial remission, most recent episode depressed: Secondary | ICD-10-CM | POA: Diagnosis not present

## 2019-10-02 NOTE — Progress Notes (Signed)
Provider Location : [facility] Patient Location : Home  Virtual Visit via Video Flores  I connected with Bailey Flores on 10/02/19 at  9:30 AM EDT by a video enabled telemedicine application and verified that I am speaking with the correct person using two identifiers.   I discussed the limitations of evaluation and management by telemedicine and the availability of in person appointments. The patient expressed understanding and agreed to proceed.     I discussed the assessment and treatment plan with the patient. The patient was provided an opportunity to ask questions and all were answered. The patient agreed with the plan and demonstrated an understanding of the instructions.   The patient was advised to call back or seek an in-person evaluation if the symptoms worsen or if the condition fails to improve as anticipated.   Bailey Flores  10/02/2019 12:32 PM [name]  MRN:  [id number]  Chief Complaint:  Chief Complaint    Follow-up     HPI: Bailey Flores is a 59 year old female, married, employed, has a history of bipolar disorder, ADHD, cognitive disorder, COPD, hypertension, chronic pain was evaluated by telemedicine today.  Patient today reports she is currently making progress with regards to her mood lability.  She is not as irritable as she was before.  Patient however does report increased appetite recently.  She reports she does not know if it is a side effect of her medication.  Her Lamictal was recently readjusted and she wonders whether the Lamictal could be contributing to it.  Patient is also on other new medications per pulmonologist for her recent medical problems.  Patient reports she continues to have several psychosocial stressors.  She and her husband are currently planning to relocate to new house and is in the process of buying a new one.  She also reports she may need another surgery soon.  She is planning to get her shoulder  repaired.  Patient however reports her husband continues to be supportive.  Overall she has been coping okay.  She reports sleep is improved.  She does report possible memory problems which are worsening.  She does have a history of memory issues and had neurology appointment in the past.  She also reports she had cognitive testing in the past.  She wonders whether she needs to go back for another testing.  Patient denies any suicidality, homicidality or perceptual disturbances.  She is compliant on her medications as prescribed.  She denies any other concerns today.  Visit Diagnosis:    ICD-10-CM   1. Bipolar disorder, in partial remission, most recent episode depressed ([place])  F31.75   2. Attention deficit hyperactivity disorder (ADHD), predominantly inattentive type  F90.0   3. Memory problem  R41.3     Past Psychiatric History: I have reviewed past psychiatric history from my progress Flores on 08/10/2017.  Past Medical History:  Past Medical History:  Diagnosis Date  . Arthritis    hands  . Asthma   . Barrett's esophagus   . Cancer [facility])    neuroendocrine ca  . COPD (chronic obstructive pulmonary disease) ([place])   . Depression   . GERD (gastroesophageal reflux disease)   . Hypertension   . Motion sickness    reading in car  . 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 Orthodontics    permanent retainers, top and bottom  . Pneumonia 2017, 2020  . Sleep apnea     Past Surgical History:  Procedure Laterality Date  .  ABDOMINAL HYSTERECTOMY    . ANTERIOR CERVICAL DECOMPRESSION/DISCECTOMY FUSION 4 LEVELS N/A 04/09/2019   Procedure: ANTERIOR CERVICAL DECOMPRESSION/DISCECTOMY FUSION 5 LEVELS C3-T1;  Surgeon: Meade Maw, MD;  Location: [facility];  Service: Neurosurgery;  Laterality: N/A;  . BIOPSY  03/17/2019   Procedure: BIOPSY;  Surgeon: [name], MD;  Location: [place];  Service: Endoscopy;;  . BOWEL RESECTION  07/19/2012  . CESAREAN SECTION     x3   . ESOPHAGOGASTRODUODENOSCOPY (EGD) WITH PROPOFOL N/A 03/05/2017   Procedure: ESOPHAGOGASTRODUODENOSCOPY (EGD) WITH PROPOFOL;  Surgeon: [name], MD;  Location: [place];  Service: Endoscopy;  Laterality: N/A;  . ESOPHAGOGASTRODUODENOSCOPY (EGD) WITH PROPOFOL N/A 03/30/2017   REPEAT IN 03/2019  . ESOPHAGOGASTRODUODENOSCOPY (EGD) WITH PROPOFOL N/A 03/17/2019   Procedure: ESOPHAGOGASTRODUODENOSCOPY (EGD) WITH PROPOFOL;  Surgeon: [name], MD;  Location: [place];  Service: Endoscopy;  Laterality: N/A;  . HAND SURGERY Right   . TRANSFORAMINAL LUMBAR INTERBODY FUSION (TLIF) WITH PEDICLE SCREW FIXATION 1 LEVEL N/A 12/05/2017   Procedure: TRANSFORAMINAL LUMBAR INTERBODY FUSION (TLIF) WITH PEDICLE SCREW FIXATION 1 LEVEL-L4-5;  Surgeon: Meade Maw, MD;  Location: [facility];  Service: Neurosurgery;  Laterality: N/A;    Family Psychiatric History: I have reviewed family psychiatric history from my progress Flores on 08/10/2017.  Family History:  Family History  Problem Relation Age of Onset  . Breast cancer Cousin        mat cousin    Social History: I have reviewed social history from my progress Flores on 08/10/2017. Social History   Socioeconomic History  . Marital status: Married    Spouse name: Not on file  . Number of children: Not on file  . Years of education: Not on file  . Highest education level: Not on file  Occupational History  . Not on file  Tobacco Use  . Smoking status: Former Smoker    Packs/day: 2.00    Years: 37.00    Pack years: 74.00    Types: Cigarettes    Quit date: 07/19/2012    Years since quitting: 7.2  . Smokeless tobacco: Never Used  Substance and Sexual Activity  . Alcohol use: Yes    Comment: 2 drinks/mo  . Drug use: No  . Sexual activity: Yes  Other Topics Concern  . Not on file  Social History Narrative  . Not on file   Social Determinants of Health   Financial Resource Strain:   . Difficulty of Paying Living  Expenses:   Food Insecurity:   . Worried About Charity fundraiser in the Last Year:   . Arboriculturist in the Last Year:   Transportation Needs:   . Film/video editor (Medical):   Marland Kitchen Lack of Transportation (Non-Medical):   Physical Activity:   . Days of Exercise per Week:   . Minutes of Exercise per Session:   Stress:   . Feeling of Stress :   Social Connections:   . Frequency of Communication with Friends and Family:   . Frequency of Social Gatherings with Friends and Family:   . Attends Religious Services:   . Active Member of Clubs or Organizations:   . Attends Archivist Meetings:   Marland Kitchen Marital Status:     Allergies: No Known Allergies  Metabolic Disorder Labs: Lab Results  Component Value Date   HGBA1C 5.9 (H) 06/13/2017   MPG 122.63 06/13/2017   Lab Results  Component Value Date   PROLACTIN 4.6 (L) 06/13/2017  Lab Results  Component Value Date   CHOL 183 06/13/2017   TRIG 80 06/13/2017   HDL 92 06/13/2017   CHOLHDL 2.0 06/13/2017   VLDL 16 06/13/2017   LDLCALC 75 06/13/2017   Lab Results  Component Value Date   TSH 1.205 06/13/2017    Therapeutic Level Labs: No results found for: LITHIUM No results found for: VALPROATE No components found for:  CBMZ  Current Medications: Current Outpatient Medications  Medication Sig Dispense Refill  . acetaminophen (TYLENOL) 650 MG CR tablet Take 325 mg by mouth 2 (two) times daily as needed (when taking hydrocodone).    . ADVAIR DISKUS 250-50 MCG/DOSE AEPB USE 1 INHALATION TWICE A DAY 180 each 3  . albuterol (PROVENTIL HFA;VENTOLIN HFA) 108 (90 Base) MCG/ACT inhaler Inhale 1-2 puffs into the lungs every 4 (four) hours as needed for wheezing or shortness of breath.     Marland Kitchen azithromycin (ZITHROMAX) 500 MG tablet Take by mouth.    Marland Kitchen azithromycin (ZITHROMAX) 500 MG tablet Take by mouth.    Marland Kitchen buPROPion (WELLBUTRIN SR) 200 MG 12 hr tablet Take 1 tablet (200 mg total) by mouth 2 (two) times daily. 180 tablet 0   . celecoxib (CELEBREX) 100 MG capsule Take 1 capsule (100 mg total) by mouth 2 (two) times daily. 60 capsule 0  . Cholecalciferol (VITAMIN D3) 25 MCG (1000 UT) CAPS Take 1,000 Units by mouth at bedtime.     Marland Kitchen ethambutol (MYAMBUTOL) 400 MG tablet     . ethambutol (MYAMBUTOL) 400 MG tablet Take by mouth.    . gabapentin (NEURONTIN) 300 MG capsule Take 300 mg 3 (three) times daily by mouth.     . lamoTRIgine (LAMICTAL) 200 MG tablet Take 1 tablet (200 mg total) by mouth 2 (two) times daily. 180 tablet 0  . lisdexamfetamine (VYVANSE) 70 MG capsule Take 1 capsule (70 mg total) by mouth daily. 90 capsule 0  . methocarbamol (ROBAXIN) 500 MG tablet Take 2 tablets (1,000 mg total) by mouth every 6 (six) hours. 80 tablet 0  . montelukast (SINGULAIR) 10 MG tablet Take 10 mg by mouth at bedtime.     Marland Kitchen omeprazole (PRILOSEC) 40 MG capsule Take 40 mg by mouth daily.     Marland Kitchen oxyCODONE (OXY IR/ROXICODONE) 5 MG immediate release tablet Take 1 tablet (5 mg total) by mouth every 3 (three) hours as needed for moderate pain ((score 4 to 6)). 30 tablet 0  . rifampin (RIFADIN) 300 MG capsule     . rifampin (RIFADIN) 300 MG capsule Take by mouth.    . SPIRIVA RESPIMAT 2.5 MCG/ACT AERS Inhale 2 puffs into the lungs every morning.     . sulfamethoxazole-trimethoprim (BACTRIM DS) 800-160 MG tablet Take 1 tablet by mouth 2 (two) times daily. 14 tablet 0  . traZODone (DESYREL) 50 MG tablet Take 0.5-1 tablets (25-50 mg total) by mouth at bedtime as needed for sleep. 90 tablet 0  . valACYclovir (VALTREX) 1000 MG tablet Take 2,000 mg by mouth 2 (two) times daily as needed (herpes simplex).     . valsartan-hydrochlorothiazide (DIOVAN-HCT) 160-12.5 MG tablet Take 1 tablet by mouth at bedtime.     Marland Kitchen aspirin 81 MG chewable tablet Chew 81 mg by mouth daily.    . diphenhydrAMINE (BENADRYL) 25 MG tablet Take 25 mg by mouth daily as needed for allergies.    Marland Kitchen ipratropium-albuterol (DUONEB) 0.5-2.5 (3) MG/3ML SOLN Inhale 3 mLs into the  lungs every 4 (four) hours as needed (Only uses with cold  symptoms).      No current facility-administered medications for this visit.     Musculoskeletal: Strength & Muscle Tone: UTA Gait & Station: UTA Patient leans: N/A  Psychiatric Specialty Exam: Review of Systems  Psychiatric/Behavioral: Positive for dysphoric mood (Improving). Negative for hallucinations, self-injury, sleep disturbance and suicidal ideas. The patient is not nervous/anxious and is not hyperactive.   All other systems reviewed and are negative.   There were no vitals taken for this visit.There is no height or weight on file to calculate BMI.  General Appearance: UTA  Eye Contact:  UTA  Speech:  Clear and Coherent  Volume:  Normal  Mood:  Dysphoric improving  Affect:  UTA  Thought Process:  Goal Directed and Descriptions of Associations: Intact  Orientation:  Full (Time, Place, and Person)  Thought Content: Logical   Suicidal Thoughts:  No  Homicidal Thoughts:  No  Memory:  Immediate;   Fair Recent;   Fair Remote;   Fair does report short term memory loss  Judgement:  Fair  Insight:  Fair  Psychomotor Activity:  UTA  Concentration:  Concentration: Fair and Attention Span: Fair  Recall:  [employer] of Knowledge: Fair  Language: Fair  Akathisia:  No  Handed:  Right  AIMS (if indicated): UTA  Assets:  Communication Skills Desire for Improvement Housing Intimacy Social Support Talents/Skills Transportation  ADL's:  Intact  Cognition: WNL  Sleep:  Improving   Screenings: PHQ2-9     Pulmonary Rehab from [facility]/25/2017 in [facility]  PHQ-2 Total Score  4  PHQ-9 Total Score  19       Assessment and Plan: Aley Hemmer is a 59 year old Hispanic female who has a history of bipolar disorder, ADHD, cognitive disorder, COPD, GERD was evaluated by telemedicine today.  Patient with psychosocial stressors of relationship struggles, her own health issues, recent surgery,  upcoming surgery, lung infection currently on rifampin and azithromycin was evaluated by telemedicine today.  Patient reports mood symptoms as improving however possibly has adverse side effects to her medications.  Patient will continue to benefit from the following medication readjustment and psychotherapy sessions as needed.  Plan as noted below.  Plan Bipolar disorder in partial remission Continue Lamictal 200 mg p.o. twice daily. Continue Wellbutrin SR 200 mg p.o. twice daily Trazodone 25 to 50 mg p.o. nightly as needed  ADHD-stable Vyvanse 70 mg p.o. daily I have reviewed [place] controlled substance database  Memory problems-unstable Patient advised to reach out to Dr. Melrose Nakayama -neurology.  Patient advised to watch her diet, replace healthy snacks when she is craving food.  Unknown if her increased appetite is due to her Lamictal or not.  However patient does not want to make any current medication changes.  We'll monitor closely.  Follow-up in clinic in 8 weeks or sooner if needed.  I have spent atleast 20 minutes non face to face with patient today. More than 50 % of the time was spent for preparing to see the patient ( e.g., review of test, records ), obtaining and to review and separately obtained history , ordering medications and test ,psychoeducation and supportive psychotherapy and care coordination,as well as documenting clinical information in electronic health record. This Flores was generated in part or whole with voice recognition software. Voice recognition is usually quite accurate but there are transcription errors that can and very often do occur. I apologize for any typographical errors that were not detected and corrected.  [name], MD 10/02/2019, 12:32 PM

## 2019-10-04 ENCOUNTER — Other Ambulatory Visit: Payer: Self-pay | Admitting: Psychiatry

## 2019-10-04 DIAGNOSIS — F900 Attention-deficit hyperactivity disorder, predominantly inattentive type: Secondary | ICD-10-CM

## 2019-10-06 ENCOUNTER — Ambulatory Visit

## 2019-10-15 DIAGNOSIS — R413 Other amnesia: Secondary | ICD-10-CM | POA: Insufficient documentation

## 2019-10-23 ENCOUNTER — Other Ambulatory Visit: Payer: Self-pay | Admitting: Family Medicine

## 2019-10-23 DIAGNOSIS — Z1231 Encounter for screening mammogram for malignant neoplasm of breast: Secondary | ICD-10-CM

## 2019-10-24 ENCOUNTER — Other Ambulatory Visit: Payer: Self-pay | Admitting: Acute Care

## 2019-10-24 ENCOUNTER — Other Ambulatory Visit (HOSPITAL_COMMUNITY): Payer: Self-pay | Admitting: Acute Care

## 2019-10-24 DIAGNOSIS — R413 Other amnesia: Secondary | ICD-10-CM

## 2019-10-27 ENCOUNTER — Ambulatory Visit (INDEPENDENT_AMBULATORY_CARE_PROVIDER_SITE_OTHER): Admitting: Pulmonary Disease

## 2019-10-27 ENCOUNTER — Other Ambulatory Visit: Payer: Self-pay

## 2019-10-27 ENCOUNTER — Encounter: Payer: Self-pay | Admitting: Pulmonary Disease

## 2019-10-27 VITALS — BP 150/80 | HR 87 | Temp 97.0°F | Ht <= 58 in | Wt 118.0 lb

## 2019-10-27 DIAGNOSIS — R911 Solitary pulmonary nodule: Secondary | ICD-10-CM

## 2019-10-27 DIAGNOSIS — A310 Pulmonary mycobacterial infection: Secondary | ICD-10-CM | POA: Diagnosis not present

## 2019-10-27 DIAGNOSIS — J449 Chronic obstructive pulmonary disease, unspecified: Secondary | ICD-10-CM | POA: Diagnosis not present

## 2019-10-27 DIAGNOSIS — R0683 Snoring: Secondary | ICD-10-CM

## 2019-10-27 MED ORDER — TRELEGY ELLIPTA 100-62.5-25 MCG/INH IN AEPB
1.0000 | INHALATION_SPRAY | Freq: Every day | RESPIRATORY_TRACT | 0 refills | Status: DC
Start: 2019-10-27 — End: 2019-12-22

## 2019-10-27 MED ORDER — TRELEGY ELLIPTA 100-62.5-25 MCG/INH IN AEPB: 1.0000 | INHALATION_SPRAY | Freq: Every day | RESPIRATORY_TRACT | 3 refills | Status: DC

## 2019-10-27 NOTE — Addendum Note (Signed)
Addended by: Lia Foyer R on: 10/27/2019 01:46 PM   Modules accepted: Orders

## 2019-10-27 NOTE — Progress Notes (Signed)
[facility]  Chief Complaint  Patient presents with  . Follow-up    Patient still has shortness of breath with exertion mostly when she walks and talks at the same time. But states she can walk to the mailbox back for forth at least twice with no issues. Patient has cough with green sputum.     Constitutional:  BP (!) 150/80 (BP Location: Left Arm, Patient Position: Sitting, Cuff Size: Normal)   Pulse 87   Temp (!) 97 F (36.1 C) (Temporal)   Ht 4' 9.87" (1.47 m)   Wt 118 lb (53.5 kg)   SpO2 97%   BMI 24.77 kg/m   Past Medical History:  Barrett's esophagus, Neuroendocrine cancer, Depression, GERD, PNA  Brief Summary:  Bailey Flores is a 59 y.o. female former smoker with COPD, MAI, and obstructive sleep apnea.  Subjective:  Her plans changed and she will continue to live in [place].  Since I spoke with her in January she was found to have Stenotrophomonas and MAI.  She is now followed by Dr. Ola Spurr with ID.  With antibiotic combination her breathing has improved and she is able to keep her weight up better.  She has occasional cough with clear to green sputum.  This has been getting better.  Occasional wheezing.  Not having fever, or hemoptysis.  She is keeping up with activity better.  Has occasional snoring, but feels her sleep is better also.  She has noticed trouble with her memory and her neurologist advised her to get sleep apnea reassessed.  Physical Exam:   Appearance - well kempt   ENMT - no sinus tenderness, no oral exudate, no LAN, Mallampati 3 airway, no stridor  Respiratory - equal breath sounds bilaterally, no wheezing or rales  CV - s1s2 regular rate and rhythm, no murmurs  Ext - no clubbing, no edema  Skin - no rashes  Psych - normal mood and affect   Assessment/Plan:   Nodular infiltrates on CT chest from 05/09/19. - in setting of Stenotrophomonas and MAI - followed by Dr. Ola Spurr with  ID at [facility] - started on azithromycin, rifampin, ethambutol 08/13/19 - will arrange for follow up CT chest w/o contrast   COPD with chronic bronchitis and emphysema. - will have her try trelegy in place of advair and spiriva - continue singulair and prn albuterol   Obstructive sleep apnea. - she hasn't been on therapy recently - she continues to have snoring - will arrange for home sleep study to assess current status  Memory loss. - followed by Dr. Melrose Nakayama with [facility] neurology  A total of  36 minutes spent addressing patient care issues on day of visit.   Follow up:   Patient Instructions  Try using trelegy one puff daily  Stop using advair and spiriva while using trelegy  Will schedule CT chest and home sleep study, and call with results  Follow up in 3 months   Signature:  Chesley Mires, MD [place] Pager: [phone] 10/27/2019, 12:44 PM  Flow Sheet     Pulmonary tests:   PFT 09/06/17 >> FEV1 1.41 (67%), FEV1% 62, TLC 3.22 (79%), DLCO 80%   Chest imaging:   CT chest lung cancer screening 05/09/19 >> atherosclerosis, mild/mod emphysema, bronchial wall thickening, multiple nodules up to 1.5 cm  Micro data:   Sputum 06/05/19 >> Stenothrophomonas, MAI  Sputum 07/16/19 >> MAI  Sputum 09/17/19 >> MAI  Sleep tests:   HST 07/03/17 >>  AHI 20.5, SpO2 low 61%  Medications:   Allergies as of 10/27/2019   No Known Allergies     Medication List       Accurate as of October 27, 2019 12:44 PM. If you have any questions, ask your nurse or doctor.        STOP taking these medications   Advair Diskus 250-50 MCG/DOSE Aepb Generic drug: Fluticasone-Salmeterol Stopped by: Chesley Mires, MD   Spiriva Respimat 2.5 MCG/ACT Aers Generic drug: Tiotropium Bromide Monohydrate Stopped by: Chesley Mires, MD   sulfamethoxazole-trimethoprim 800-160 MG tablet Commonly known as: BACTRIM DS Stopped by: Chesley Mires, MD     TAKE these medications    acetaminophen 650 MG CR tablet Commonly known as: TYLENOL Take 325 mg by mouth 2 (two) times daily as needed (when taking hydrocodone).   albuterol 108 (90 Base) MCG/ACT inhaler Commonly known as: VENTOLIN HFA Inhale 1-2 puffs into the lungs every 4 (four) hours as needed for wheezing or shortness of breath.   aspirin 81 MG chewable tablet Chew 81 mg by mouth daily.   azithromycin 500 MG tablet Commonly known as: ZITHROMAX Take by mouth.   azithromycin 500 MG tablet Commonly known as: ZITHROMAX Take by mouth.   buPROPion 200 MG 12 hr tablet Commonly known as: WELLBUTRIN SR TAKE 1 TABLET TWICE A DAY   celecoxib 100 MG capsule Commonly known as: CeleBREX Take 1 capsule (100 mg total) by mouth 2 (two) times daily.   diphenhydrAMINE 25 MG tablet Commonly known as: BENADRYL Take 25 mg by mouth daily as needed for allergies.   ethambutol 400 MG tablet Commonly known as: MYAMBUTOL 3 1/2 TID What changed: Another medication with the same name was removed. Continue taking this medication, and follow the directions you see here. Changed by: Chesley Mires, MD   gabapentin 300 MG capsule Commonly known as: NEURONTIN Take 300 mg 3 (three) times daily by mouth.   ipratropium-albuterol 0.5-2.5 (3) MG/3ML Soln Commonly known as: DUONEB Inhale 3 mLs into the lungs every 4 (four) hours as needed (Only uses with cold symptoms).   lamoTRIgine 200 MG tablet Commonly known as: LaMICtal Take 1 tablet (200 mg total) by mouth 2 (two) times daily.   lisdexamfetamine 70 MG capsule Commonly known as: VYVANSE Take 1 capsule (70 mg total) by mouth daily.   methocarbamol 500 MG tablet Commonly known as: ROBAXIN Take 2 tablets (1,000 mg total) by mouth every 6 (six) hours.   montelukast 10 MG tablet Commonly known as: SINGULAIR Take 10 mg by mouth at bedtime.   omeprazole 40 MG capsule Commonly known as: PRILOSEC Take 40 mg by mouth daily.   oxyCODONE 5 MG immediate release  tablet Commonly known as: Oxy IR/ROXICODONE Take 1 tablet (5 mg total) by mouth every 3 (three) hours as needed for moderate pain ((score 4 to 6)). What changed: additional instructions   rifampin 300 MG capsule Commonly known as: RIFADIN   rifampin 300 MG capsule Commonly known as: RIFADIN Take by mouth.   traZODone 50 MG tablet Commonly known as: DESYREL Take 0.5-1 tablets (25-50 mg total) by mouth at bedtime as needed for sleep.   Trelegy Ellipta 100-62.5-25 MCG/INH Aepb Generic drug: Fluticasone-Umeclidin-Vilant Inhale 1 puff into the lungs daily. Started by: Chesley Mires, MD   Trelegy Ellipta 100-62.5-25 MCG/INH Aepb Generic drug: Fluticasone-Umeclidin-Vilant Inhale 1 puff into the lungs daily. Started by: Chesley Mires, MD   valACYclovir 1000 MG tablet Commonly known as: VALTREX Take 2,000 mg by mouth 2 (two)  times daily as needed (herpes simplex).   valsartan-hydrochlorothiazide 160-12.5 MG tablet Commonly known as: DIOVAN-HCT Take 1 tablet by mouth at bedtime.   Vitamin D3 25 MCG (1000 UT) Caps Take 1,000 Units by mouth at bedtime.       Past Surgical History:  She  has a past surgical history that includes Bowel resection (07/19/2012); Abdominal hysterectomy; Cesarean section; Hand surgery (Right); Esophagogastroduodenoscopy (egd) with propofol (N/A, 03/05/2017); Esophagogastroduodenoscopy (egd) with propofol (N/A, 03/30/2017); Transforaminal lumbar interbody fusion (tlif) with pedicle screw fixation 1 level (N/A, 12/05/2017); Esophagogastroduodenoscopy (egd) with propofol (N/A, 03/17/2019); biopsy (03/17/2019); and Anterior cervical decompression/discectomy fusion 4 level (N/A, 04/09/2019).  Family History:  Her family history includes Breast cancer in her cousin.  Social History:  She  reports that she quit smoking about 7 years ago. Her smoking use included cigarettes. She has a 74.00 pack-year smoking history. She has never used smokeless tobacco. She reports  current alcohol use. She reports that she does not use drugs.

## 2019-10-27 NOTE — Patient Instructions (Signed)
Try using trelegy one puff daily  Stop using advair and spiriva while using trelegy  Will schedule CT chest and home sleep study, and call with results  Follow up in 3 months

## 2019-11-06 ENCOUNTER — Ambulatory Visit
Admission: RE | Admit: 2019-11-06 | Discharge: 2019-11-06 | Disposition: A | Discharge status: Home / Self Care | Source: Ambulatory Visit | Attending: Acute Care | Admitting: Acute Care

## 2019-11-06 ENCOUNTER — Ambulatory Visit
Admission: RE | Admit: 2019-11-06 | Discharge: 2019-11-06 | Disposition: A | Discharge status: Home / Self Care | Source: Ambulatory Visit | Attending: Pulmonary Disease | Admitting: Pulmonary Disease

## 2019-11-06 ENCOUNTER — Other Ambulatory Visit: Payer: Self-pay

## 2019-11-06 ENCOUNTER — Other Ambulatory Visit: Payer: Self-pay | Admitting: Pulmonary Disease

## 2019-11-06 DIAGNOSIS — R413 Other amnesia: Secondary | ICD-10-CM | POA: Diagnosis not present

## 2019-11-06 DIAGNOSIS — Z1211 Encounter for screening for malignant neoplasm of colon: Secondary | ICD-10-CM

## 2019-11-06 DIAGNOSIS — R911 Solitary pulmonary nodule: Secondary | ICD-10-CM

## 2019-11-06 MED ORDER — NA SULFATE-K SULFATE-MG SULF 17.5-3.13-1.6 GM/177ML PO SOLN: 1.0000 | Freq: Once | ORAL | 0 refills | Status: AC

## 2019-11-07 ENCOUNTER — Ambulatory Visit

## 2019-11-10 ENCOUNTER — Other Ambulatory Visit: Payer: Self-pay | Admitting: Psychiatry

## 2019-11-10 ENCOUNTER — Telehealth

## 2019-11-10 DIAGNOSIS — F3131 Bipolar disorder, current episode depressed, mild: Secondary | ICD-10-CM

## 2019-11-12 ENCOUNTER — Telehealth: Payer: Self-pay | Admitting: *Deleted

## 2019-11-12 NOTE — Telephone Encounter (Signed)
Notified patient of LDCT lung cancer screening program results with recommendation for 6 month follow up imaging. Also notified of incidental findings noted below and is encouraged to discuss further with PCP who will receive a copy of this note and/or the CT report. Patient verbalizes understanding and reports she is undergoing treatment for MAC pulmonary infection.   IMPRESSION: 1. Lung-RADS 3, probably benign findings. Short-term follow-up in 6 months is recommended with repeat low-dose chest CT without contrast (please use the following order, "CT CHEST LCS NODULE FOLLOW-UP W/O CM"). Although the bibasilar aeration has improved and previously described nodules of interest have improved to resolved, there are new right-sided pulmonary nodules including at up to volume derived equivalent diameter 5.9 mm. Favored to be infectious. Consider antibiotic therapy prior to CT follow-up. 2.  Emphysema (ICD10-J43.9). 3. Age advanced coronary artery atherosclerosis. Recommend assessment of coronary risk factors and consideration of medical therapy.

## 2019-11-13 ENCOUNTER — Telehealth: Payer: Self-pay | Admitting: Pulmonary Disease

## 2019-11-13 MED ORDER — TRELEGY ELLIPTA 100-62.5-25 MCG/INH IN AEPB: 1.0000 | INHALATION_SPRAY | Freq: Every day | RESPIRATORY_TRACT | 3 refills | Status: AC

## 2019-11-13 NOTE — Telephone Encounter (Signed)
Patient contacted, Trelegy refill sent to correct pharmacy for 90 day supply.

## 2019-11-20 ENCOUNTER — Ambulatory Visit
Admission: RE | Admit: 2019-11-20 | Discharge: 2019-11-20 | Disposition: A | Discharge status: Home / Self Care | Source: Ambulatory Visit | Attending: Orthopedic Surgery | Admitting: Orthopedic Surgery

## 2019-11-20 ENCOUNTER — Other Ambulatory Visit: Payer: Self-pay

## 2019-11-20 DIAGNOSIS — M19011 Primary osteoarthritis, right shoulder: Secondary | ICD-10-CM | POA: Insufficient documentation

## 2019-11-25 ENCOUNTER — Telehealth: Payer: Self-pay | Admitting: Pulmonary Disease

## 2019-11-25 ENCOUNTER — Other Ambulatory Visit: Payer: Self-pay | Admitting: Nurse Practitioner

## 2019-11-25 DIAGNOSIS — Z87891 Personal history of nicotine dependence: Secondary | ICD-10-CM

## 2019-11-25 DIAGNOSIS — M542 Cervicalgia: Secondary | ICD-10-CM

## 2019-11-25 NOTE — Telephone Encounter (Signed)
LDCT chest 11/06/19 >> coronary calcification, mild centrilobular emphysema, improved RLL thickening/impaction, basilar nodules improved, new nodule RUL 5.9 mm   Attempted to call patient but not answer at listed number.  Please let her know that CT chest shows improvement in most of previous findings from mycobacterial infection. She does have a new small nodule in the upper part of her right lung which is likely related to her infection.  She will need f/u CT chest in December 2021 (please use the following order, "CT CHEST LCS NODULE FOLLOW-UP W/O CM).

## 2019-11-25 NOTE — Telephone Encounter (Signed)
Spoke to pt and advised of CT results per Sr Sood. Pt verbalized understanding. CT Chest LCS Nodule f/u order placed for 04/2020. Nothing further needed at this time.

## 2019-11-27 ENCOUNTER — Other Ambulatory Visit: Payer: Self-pay

## 2019-11-27 ENCOUNTER — Telehealth: Admitting: Psychiatry

## 2019-11-27 ENCOUNTER — Ambulatory Visit
Admission: RE | Admit: 2019-11-27 | Discharge: 2019-11-27 | Disposition: A | Discharge status: Home / Self Care | Source: Ambulatory Visit | Attending: Nurse Practitioner | Admitting: Nurse Practitioner

## 2019-11-27 ENCOUNTER — Other Ambulatory Visit
Admission: RE | Admit: 2019-11-27 | Discharge: 2019-11-27 | Disposition: A | Discharge status: Home / Self Care | Source: Ambulatory Visit | Attending: Gastroenterology | Admitting: Gastroenterology

## 2019-11-27 DIAGNOSIS — M542 Cervicalgia: Secondary | ICD-10-CM | POA: Diagnosis not present

## 2019-11-27 DIAGNOSIS — Z20822 Contact with and (suspected) exposure to covid-19: Secondary | ICD-10-CM | POA: Diagnosis not present

## 2019-11-27 DIAGNOSIS — M4802 Spinal stenosis, cervical region: Secondary | ICD-10-CM | POA: Insufficient documentation

## 2019-11-27 DIAGNOSIS — M4803 Spinal stenosis, cervicothoracic region: Secondary | ICD-10-CM | POA: Insufficient documentation

## 2019-11-27 DIAGNOSIS — Z01812 Encounter for preprocedural laboratory examination: Secondary | ICD-10-CM | POA: Insufficient documentation

## 2019-11-27 LAB — SARS CORONAVIRUS 2 (TAT 6-24 HRS): SARS Coronavirus 2: NEGATIVE

## 2019-11-28 ENCOUNTER — Other Ambulatory Visit: Payer: Self-pay | Admitting: Nurse Practitioner

## 2019-11-28 ENCOUNTER — Other Ambulatory Visit

## 2019-11-28 DIAGNOSIS — Z981 Arthrodesis status: Secondary | ICD-10-CM

## 2019-12-02 ENCOUNTER — Encounter
Admission: RE | Disposition: A | Discharge status: Home / Self Care | Payer: Self-pay | Source: Home / Self Care | Attending: Gastroenterology

## 2019-12-02 ENCOUNTER — Ambulatory Visit: Admitting: Anesthesiology

## 2019-12-02 ENCOUNTER — Other Ambulatory Visit: Payer: Self-pay

## 2019-12-02 ENCOUNTER — Ambulatory Visit
Admission: RE | Admit: 2019-12-02 | Discharge: 2019-12-02 | Disposition: A | Discharge status: Home / Self Care | Attending: Gastroenterology | Admitting: Gastroenterology

## 2019-12-02 ENCOUNTER — Encounter: Payer: Self-pay | Admitting: Gastroenterology

## 2019-12-02 DIAGNOSIS — G4730 Sleep apnea, unspecified: Secondary | ICD-10-CM | POA: Diagnosis not present

## 2019-12-02 DIAGNOSIS — K219 Gastro-esophageal reflux disease without esophagitis: Secondary | ICD-10-CM | POA: Diagnosis not present

## 2019-12-02 DIAGNOSIS — Z859 Personal history of malignant neoplasm, unspecified: Secondary | ICD-10-CM | POA: Insufficient documentation

## 2019-12-02 DIAGNOSIS — J449 Chronic obstructive pulmonary disease, unspecified: Secondary | ICD-10-CM | POA: Diagnosis not present

## 2019-12-02 DIAGNOSIS — Z803 Family history of malignant neoplasm of breast: Secondary | ICD-10-CM | POA: Diagnosis not present

## 2019-12-02 DIAGNOSIS — Z1211 Encounter for screening for malignant neoplasm of colon: Secondary | ICD-10-CM | POA: Diagnosis not present

## 2019-12-02 DIAGNOSIS — Z87891 Personal history of nicotine dependence: Secondary | ICD-10-CM | POA: Diagnosis not present

## 2019-12-02 DIAGNOSIS — Z981 Arthrodesis status: Secondary | ICD-10-CM | POA: Insufficient documentation

## 2019-12-02 DIAGNOSIS — Z79899 Other long term (current) drug therapy: Secondary | ICD-10-CM | POA: Diagnosis not present

## 2019-12-02 DIAGNOSIS — M1990 Unspecified osteoarthritis, unspecified site: Secondary | ICD-10-CM | POA: Diagnosis not present

## 2019-12-02 DIAGNOSIS — F319 Bipolar disorder, unspecified: Secondary | ICD-10-CM | POA: Insufficient documentation

## 2019-12-02 DIAGNOSIS — I10 Essential (primary) hypertension: Secondary | ICD-10-CM | POA: Diagnosis not present

## 2019-12-02 DIAGNOSIS — Z791 Long term (current) use of non-steroidal anti-inflammatories (NSAID): Secondary | ICD-10-CM | POA: Insufficient documentation

## 2019-12-02 DIAGNOSIS — Z90710 Acquired absence of both cervix and uterus: Secondary | ICD-10-CM | POA: Diagnosis not present

## 2019-12-02 DIAGNOSIS — K640 First degree hemorrhoids: Secondary | ICD-10-CM | POA: Insufficient documentation

## 2019-12-02 DIAGNOSIS — G709 Myoneural disorder, unspecified: Secondary | ICD-10-CM | POA: Insufficient documentation

## 2019-12-02 DIAGNOSIS — K2270 Barrett's esophagus without dysplasia: Secondary | ICD-10-CM | POA: Insufficient documentation

## 2019-12-02 DIAGNOSIS — Z7982 Long term (current) use of aspirin: Secondary | ICD-10-CM | POA: Insufficient documentation

## 2019-12-02 HISTORY — PX: COLONOSCOPY WITH PROPOFOL: SHX5780

## 2019-12-02 SURGERY — COLONOSCOPY WITH PROPOFOL
Anesthesia: General

## 2019-12-02 MED ORDER — SODIUM CHLORIDE 0.9 % IV SOLN: INTRAVENOUS | Status: DC

## 2019-12-02 MED ORDER — PROPOFOL 500 MG/50ML IV EMUL
INTRAVENOUS | Status: DC | PRN
  Administered 2019-12-02: 150 ug/kg/min via INTRAVENOUS

## 2019-12-02 NOTE — Anesthesia Preprocedure Evaluation (Signed)
Anesthesia Evaluation  Patient identified by MRN, date of birth, ID band Patient awake    Reviewed: Allergy & Precautions, H&P , NPO status , Patient's Chart, lab work & pertinent test results  History of Anesthesia Complications Negative for: history of anesthetic complications  Airway Mallampati: III  TM Distance: <3 FB Neck ROM: Full    Dental  (+) Chipped, Poor Dentition   Pulmonary neg shortness of breath, asthma , sleep apnea , pneumonia, COPD, former smoker,           Cardiovascular Exercise Tolerance: Good hypertension, (-) angina(-) Past MI and (-) DOE      Neuro/Psych PSYCHIATRIC DISORDERS Depression Bipolar Disorder  Neuromuscular disease    GI/Hepatic Neg liver ROS, GERD  Medicated and Controlled,  Endo/Other  negative endocrine ROS  Renal/GU      Musculoskeletal  (+) Arthritis ,   Abdominal   Peds  Hematology negative hematology ROS (+)   Anesthesia Other Findings Past Medical History: No date: Arthritis     Comment:  hands No date: Asthma No date: Barrett's esophagus No date: Cancer (HCC)     Comment:  neuroendocrine ca No date: COPD (chronic obstructive pulmonary disease) (HCC) No date: Depression No date: GERD (gastroesophageal reflux disease) No date: Hypertension No date: Motion sickness     Comment:  reading in car 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[place]) 2017: Pneumonia No date: Sleep apnea  Past Surgical History: No date: ABDOMINAL HYSTERECTOMY 07/19/2012: BOWEL RESECTION No date: CESAREAN SECTION     Comment:  x3 03/05/2017: ESOPHAGOGASTRODUODENOSCOPY (EGD) WITH PROPOFOL; N/A     Comment:  Procedure: ESOPHAGOGASTRODUODENOSCOPY (EGD) WITH               PROPOFOL;  Surgeon: [name], MD;  Location: [facility];  Service: Endoscopy;  Laterality: N/A; 03/30/2017: ESOPHAGOGASTRODUODENOSCOPY (EGD) WITH PROPOFOL; N/A     Comment:  REPEAT IN 03/2019 No date:  HAND SURGERY; Right  BMI    Body Mass Index:  28.01 kg/m      Reproductive/Obstetrics negative OB ROS                             Anesthesia Physical  Anesthesia Plan  ASA: III  Anesthesia Plan: General   Post-op Pain Management:    Induction: Intravenous  PONV Risk Score and Plan: 3 and Ondansetron, Propofol infusion and TIVA  Airway Management Planned: Natural Airway and Nasal Cannula  Additional Equipment: None  Intra-op Plan:   Post-operative Plan: Extubation in OR  Informed Consent: I have reviewed the patients History and Physical, chart, labs and discussed the procedure including the risks, benefits and alternatives for the proposed anesthesia with the patient or authorized representative who has indicated his/her understanding and acceptance.     Dental Advisory Given  Plan Discussed with: Anesthesiologist, CRNA and Surgeon  Anesthesia Plan Comments: (Patient consented for risks of anesthesia including but not limited to:  - adverse reactions to medications - damage to teeth, lips or other oral mucosa - sore throat or hoarseness - Damage to heart, brain, lungs or loss of life  Patient voiced understanding.)        Anesthesia Quick Evaluation

## 2019-12-02 NOTE — Transfer of Care (Signed)
Immediate Anesthesia Transfer of Care Note  Patient: Bailey Flores  Procedure(s) Performed: COLONOSCOPY WITH PROPOFOL (N/A )  Patient Location: PACU and Endoscopy Unit  Anesthesia Type:General  Level of Consciousness: drowsy  Airway & Oxygen Therapy: Patient Spontanous Breathing  Post-op Assessment: Report given to RN  Post vital signs: stable  Last Vitals:  Vitals Value Taken Time  BP    Temp    Pulse    Resp    SpO2      Last Pain:  Vitals:   12/02/19 1100  TempSrc: Temporal  PainSc: 8          Complications: No complications documented.

## 2019-12-02 NOTE — Op Note (Signed)
[facility] Gastroenterology Patient Name: Bailey Flores Procedure Date: 12/02/2019 10:58 AM MRN: [id number] Account #: [id number] Date of Birth: 1961/04/22 Admit Type: Outpatient Age: 59 Room: [facility] ENDO ROOM 4 Gender: Female Note Status: Finalized Procedure:             Colonoscopy Indications:           Screening for colorectal malignant neoplasm Providers:             [name] MD, MD Referring MD:          [name] (Referring MD) Medicines:             Propofol per Anesthesia Complications:         No immediate complications. Procedure:             Pre-Anesthesia Assessment:                        - Prior to the procedure, a History and Physical was                         performed, and patient medications and allergies were                         reviewed. The patient's tolerance of previous                         anesthesia was also reviewed. The risks and benefits                         of the procedure and the sedation options and risks                         were discussed with the patient. All questions were                         answered, and informed consent was obtained. Prior                         Anticoagulants: The patient has taken no previous                         anticoagulant or antiplatelet agents. ASA Grade                         Assessment: II - A patient with mild systemic disease.                         After reviewing the risks and benefits, the patient                         was deemed in satisfactory condition to undergo the                         procedure.                        After obtaining informed consent, the colonoscope was  passed under direct vision. Throughout the procedure,                         the patient's blood pressure, pulse, and oxygen                         saturations were monitored continuously. The                         Colonoscope was introduced through the  anus and                         advanced to the the cecum, identified by appendiceal                         orifice and ileocecal valve. The colonoscopy was                         performed without difficulty. The patient tolerated                         the procedure well. The quality of the bowel                         preparation was excellent. Findings:      The perianal and digital rectal examinations were normal.      Non-bleeding internal hemorrhoids were found during retroflexion. The       hemorrhoids were Grade I (internal hemorrhoids that do not prolapse). Impression:            - Non-bleeding internal hemorrhoids.                        - No specimens collected. Recommendation:        - Discharge patient to home.                        - Resume previous diet.                        - Continue present medications.                        - Repeat colonoscopy in 10 years for screening unless                         any change in family history or lower GI problems. Procedure Code(s):     --- Professional ---                        [phone], Colonoscopy, flexible; diagnostic, including                         collection of specimen(s) by brushing or washing, when                         performed (separate procedure) Diagnosis Code(s):     --- Professional ---                        Z12.11, Encounter for  screening for malignant neoplasm                         of colon CPT copyright 2019 American Medical Association. All rights reserved. The codes documented in this report are preliminary and upon coder review may  be revised to meet current compliance requirements. [name] MD, MD 12/02/2019 11:38:04 AM This report has been signed electronically. Number of Addenda: 0 Note Initiated On: 12/02/2019 10:58 AM Scope Withdrawal Time: 0 hours 6 minutes 11 seconds  Total Procedure Duration: 0 hours 12 minutes 29 seconds  Estimated Blood Loss:  Estimated blood loss: none.       [facility]

## 2019-12-02 NOTE — Anesthesia Postprocedure Evaluation (Signed)
Anesthesia Post Note  Patient: Bailey Flores  Procedure(s) Performed: COLONOSCOPY WITH PROPOFOL (N/A )  Patient location during evaluation: Endoscopy Anesthesia Type: General Level of consciousness: awake and alert Pain management: pain level controlled Vital Signs Assessment: post-procedure vital signs reviewed and stable Respiratory status: spontaneous breathing, nonlabored ventilation, respiratory function stable and patient connected to nasal cannula oxygen Cardiovascular status: blood pressure returned to baseline and stable Postop Assessment: no apparent nausea or vomiting Anesthetic complications: no   No complications documented.   Last Vitals:  Vitals:   12/02/19 1150 12/02/19 1200  BP: (!) 86/43 123/78  Pulse: 78 66  Resp: 15 16  Temp:    SpO2: 100% 100%    Last Pain:  Vitals:   12/02/19 1200  TempSrc:   PainSc: 9                  Arita Miss

## 2019-12-02 NOTE — H&P (Signed)
Bailey Lame, MD [address] [place],  [address] Phone: [phone] Fax : [phone]  Primary Care Physician:  [name], MD Primary Gastroenterologist:  Dr. Allen Norris  Pre-Procedure History & Physical: HPI:  Bailey Flores is a 59 y.o. female is here for a screening colonoscopy.   Past Medical History:  Diagnosis Date   Arthritis    hands   Asthma    Barrett's esophagus    Cancer ([place])    neuroendocrine ca   COPD (chronic obstructive pulmonary disease) (Bailey Flores)    Depression    GERD (gastroesophageal reflux disease)    Hypertension    Motion sickness    reading in car   Neuroendocrine carcinoma (Bailey Flores) 01/16/2017   Orthodontics    permanent retainers, top and bottom   Pneumonia 2017, 2020   Sleep apnea     Past Surgical History:  Procedure Laterality Date   ABDOMINAL HYSTERECTOMY     ANTERIOR CERVICAL DECOMPRESSION/DISCECTOMY FUSION 4 LEVELS N/A 04/09/2019   Procedure: ANTERIOR CERVICAL DECOMPRESSION/DISCECTOMY FUSION 5 LEVELS C3-T1;  Surgeon: Meade Maw, MD;  Location: [facility];  Service: Neurosurgery;  Laterality: N/A;   BIOPSY  03/17/2019   Procedure: BIOPSY;  Surgeon: Bailey Lame, MD;  Location: [place];  Service: Endoscopy;;   BOWEL RESECTION  07/19/2012   CESAREAN SECTION     x3   ESOPHAGOGASTRODUODENOSCOPY (EGD) WITH PROPOFOL N/A 03/05/2017   Procedure: ESOPHAGOGASTRODUODENOSCOPY (EGD) WITH PROPOFOL;  Surgeon: Bailey Lame, MD;  Location: [place];  Service: Endoscopy;  Laterality: N/A;   ESOPHAGOGASTRODUODENOSCOPY (EGD) WITH PROPOFOL N/A 03/30/2017   REPEAT IN 03/2019   ESOPHAGOGASTRODUODENOSCOPY (EGD) WITH PROPOFOL N/A 03/17/2019   Procedure: ESOPHAGOGASTRODUODENOSCOPY (EGD) WITH PROPOFOL;  Surgeon: Bailey Lame, MD;  Location: [facility];  Service: Endoscopy;  Laterality: N/A;   HAND SURGERY Right    TRANSFORAMINAL LUMBAR INTERBODY FUSION (TLIF) WITH PEDICLE  SCREW FIXATION 1 LEVEL N/A 12/05/2017   Procedure: TRANSFORAMINAL LUMBAR INTERBODY FUSION (TLIF) WITH PEDICLE SCREW FIXATION 1 LEVEL-L4-5;  Surgeon: Meade Maw, MD;  Location: [facility];  Service: Neurosurgery;  Laterality: N/A;    Prior to Admission medications   Medication Sig Start Date End Date Taking? Authorizing Provider  aspirin 81 MG chewable tablet Chew 81 mg by mouth daily.   Yes [provider]  celecoxib (CELEBREX) 100 MG capsule Take 1 capsule (100 mg total) by mouth 2 (two) times daily. 04/11/19  Yes [name], PA-C  valsartan-hydrochlorothiazide (DIOVAN-HCT) 160-12.5 MG tablet Take 1 tablet by mouth at bedtime.  08/05/18  Yes [provider]  acetaminophen (TYLENOL) 650 MG CR tablet Take 325 mg by mouth 2 (two) times daily as needed (when taking hydrocodone).    [provider]  albuterol (PROVENTIL HFA;VENTOLIN HFA) 108 (90 Base) MCG/ACT inhaler Inhale 1-2 puffs into the lungs every 4 (four) hours as needed for wheezing or shortness of breath.     [provider]  azithromycin (ZITHROMAX) 500 MG tablet Take by mouth. 07/30/19 07/29/20  [provider]  azithromycin (ZITHROMAX) 500 MG tablet Take by mouth. 08/27/19 08/26/20  [provider]  buPROPion (WELLBUTRIN SR) 200 MG 12 hr tablet TAKE 1 TABLET TWICE A DAY 10/06/19   [name], MD  Cholecalciferol (VITAMIN D3) 25 MCG (1000 UT) CAPS Take 1,000 Units by mouth at bedtime.     [provider]  diphenhydrAMINE (BENADRYL) 25 MG tablet Take 25 mg by mouth daily as needed for allergies.    [provider]  Docusate Sodium (DSS) 100 MG CAPS Take by mouth.    [provider]  ethambutol (MYAMBUTOL) 400 MG tablet 3 1/2 TID 07/30/19   [provider]  Fluticasone-Umeclidin-Vilant (TRELEGY ELLIPTA) 100-62.5-25 MCG/INH AEPB Inhale 1 puff into the lungs daily. 10/27/19   Chesley Mires, MD  Fluticasone-Umeclidin-Vilant (TRELEGY ELLIPTA) 100-62.5-25  MCG/INH AEPB Inhale 1 puff into the lungs daily. 11/13/19   Chesley Mires, MD  gabapentin (NEURONTIN) 300 MG capsule Take 300 mg 3 (three) times daily by mouth.  02/15/17   [provider]  ibuprofen (ADVIL) 800 MG tablet Take by mouth.    [provider]  ipratropium-albuterol (DUONEB) 0.5-2.5 (3) MG/3ML SOLN Inhale 3 mLs into the lungs every 4 (four) hours as needed (Only uses with cold symptoms).  01/16/17 10/27/19  [provider]  lamoTRIgine (LAMICTAL) 200 MG tablet TAKE 1 TABLET TWICE A DAY (DOSE INCREASE) 11/10/19   [name], MD  lisdexamfetamine (VYVANSE) 70 MG capsule Take 1 capsule (70 mg total) by mouth daily. 09/26/19   [name], MD  methocarbamol (ROBAXIN) 500 MG tablet Take 2 tablets (1,000 mg total) by mouth every 6 (six) hours. 04/11/19   [name], PA-C  montelukast (SINGULAIR) 10 MG tablet Take 10 mg by mouth at bedtime.  01/16/17   [provider]  omeprazole (PRILOSEC) 40 MG capsule Take 40 mg by mouth daily.  01/16/17   [provider]  oxyCODONE (OXY IR/ROXICODONE) 5 MG immediate release tablet Take 1 tablet (5 mg total) by mouth every 3 (three) hours as needed for moderate pain ((score 4 to 6)). Patient taking differently: Take 5 mg by mouth every 3 (three) hours as needed for moderate pain ((score 4 to 6)). Take 2 tablet by mouth every 3 hours as needed for pain 04/11/19   [name], PA-C  rifampin (RIFADIN) 300 MG capsule  07/30/19   [provider]  rifampin (RIFADIN) 300 MG capsule Take by mouth. 07/30/19 07/29/20  [provider]  traMADol (ULTRAM) 50 MG tablet Take 50 mg by mouth every 6 (six) hours as needed. 10/13/19   [provider]  traZODone (DESYREL) 50 MG tablet Take 0.5-1 tablets (25-50 mg total) by mouth at bedtime as needed for sleep. 09/04/19   [name], MD  valACYclovir (VALTREX) 1000 MG tablet Take 2,000 mg by mouth 2 (two) times daily as needed (herpes simplex).  11/01/18    [provider]    Allergies as of 11/06/2019   (No Known Allergies)    Family History  Problem Relation Age of Onset   Breast cancer Cousin        mat cousin    Social History   Socioeconomic History   Marital status: Married    Spouse name: Not on file   Number of children: Not on file   Years of education: Not on file   Highest education level: Not on file  Occupational History   Not on file  Tobacco Use   Smoking status: Former Smoker    Packs/day: 2.00    Years: 37.00    Pack years: 74.00    Types: Cigarettes    Quit date: 07/19/2012    Years since quitting: 7.3   Smokeless tobacco: Never Used  Vaping Use   Vaping Use: Never used  Substance and Sexual Activity   Alcohol use: Yes    Comment: 2 drinks/mo   Drug use: No   Sexual activity: Yes  Other Topics Concern   Not on file  Social History Narrative   Not on file   Social Determinants of Health   Financial Resource Strain:    Difficulty of Paying Living Expenses:   Food Insecurity:    Worried About Charity fundraiser in the Last Year:    Arboriculturist in the Last Year:   Transportation Needs:    Film/video editor (Medical):    Lack of Transportation (Non-Medical):   Physical Activity:    Days of Exercise per Week:    Minutes of Exercise per Session:   Stress:    Feeling of Stress :   Social Connections:    Frequency of Communication with Friends and Family:    Frequency of Social Gatherings with Friends and Family:    Attends Religious Services:    Active Member of Clubs or Organizations:    Attends Music therapist:    Marital Status:   Intimate Partner Violence:    Fear of Current or Ex-Partner:    Emotionally Abused:    Physically Abused:    Sexually Abused:     Review of Systems: See HPI, otherwise negative ROS  Physical Exam: BP (!) 140/91    Pulse 70    Temp (!) 96.7 F (35.9 C) (Temporal)    Resp 16    Ht 4\' 9"   (1.448 m)    Wt 50.3 kg    SpO2 99%    BMI 24.02 kg/m  General:   Alert,  pleasant and cooperative in NAD Head:  Normocephalic and atraumatic. Neck:  Supple; no masses or thyromegaly. Lungs:  Clear throughout to auscultation.    Heart:  Regular rate and rhythm. Abdomen:  Soft, nontender and nondistended. Normal bowel sounds, without guarding, and without rebound.   Neurologic:  Alert and  oriented x4;  grossly normal neurologically.  Impression/Plan: Daryan Cagley is now here to undergo a screening colonoscopy.  Risks, benefits, and alternatives regarding colonoscopy have been reviewed with the patient.  Questions have been answered.  All parties agreeable.

## 2019-12-03 ENCOUNTER — Encounter: Payer: Self-pay | Admitting: Gastroenterology

## 2019-12-05 IMAGING — CT CT CHEST LUNG CANCER SCREENING LOW DOSE W/O CM
1 of 2 series · 10 of 20 positions shown, 13 images · non-contrast
Comparison: None.

CLINICAL DATA: Lung cancer screening. Former smoker. Seventy-five
pack-year history. Asymptomatic.

EXAM:
CT CHEST WITHOUT CONTRAST LOW-DOSE FOR LUNG CANCER SCREENING
TECHNIQUE: Multidetector CT imaging of the chest was performed following the
standard protocol without IV contrast.

[ct lung segmentation data · axial · 0.65mm/px · z∈[-535,-535]mm · 10 of 280 frames shown]
[frame 1/280  mediastinal]
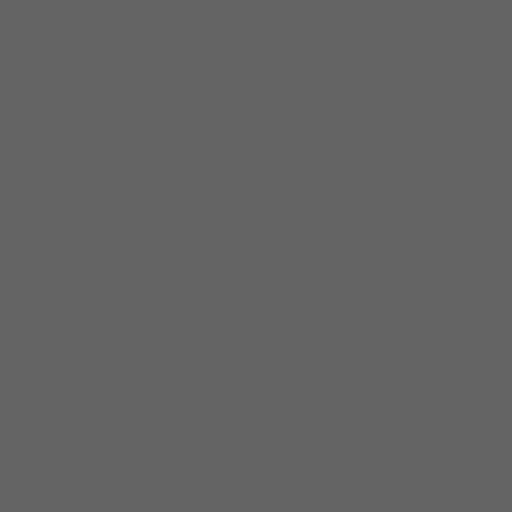
[frame 1/280  lung]
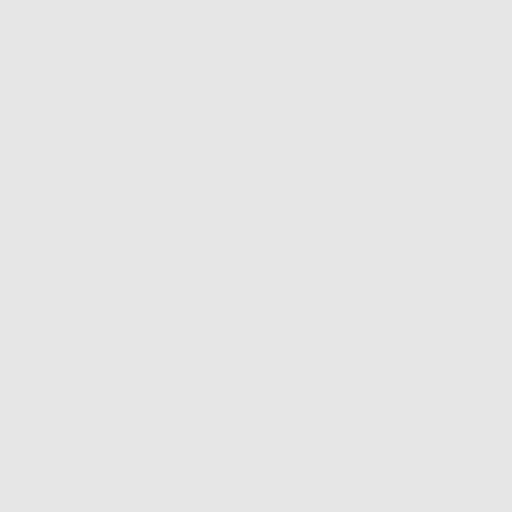
[frame 32/280  lung]
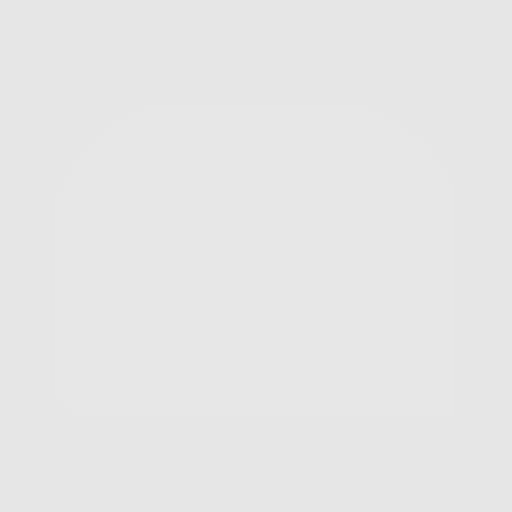
[frame 63/280  lung]
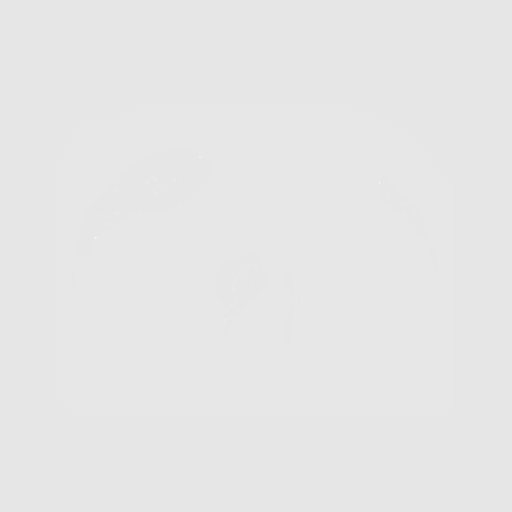
[frame 94/280  lung]
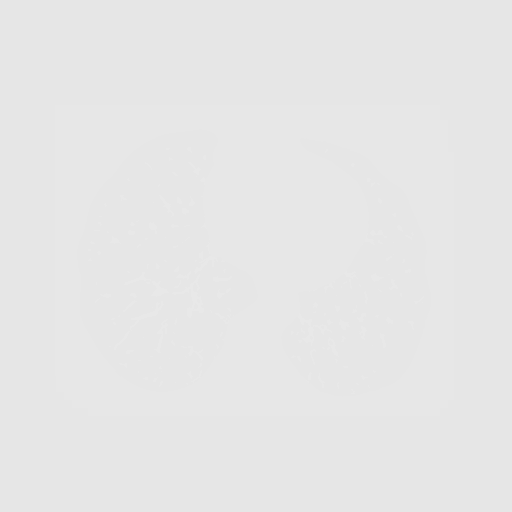
[frame 125/280  mediastinal]
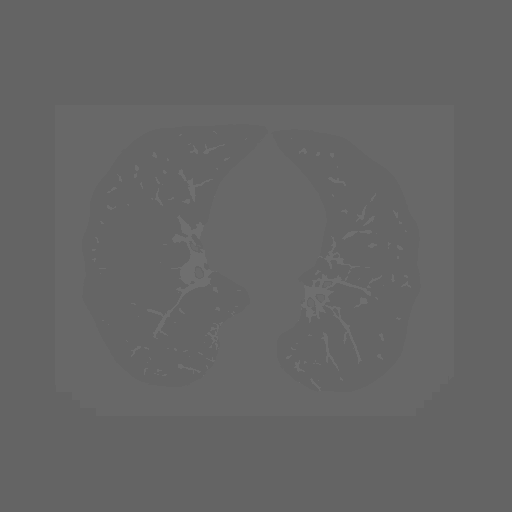
[frame 125/280  lung]
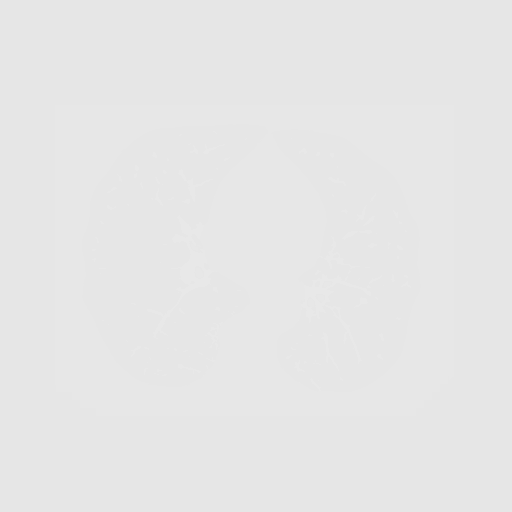
[frame 156/280  lung]
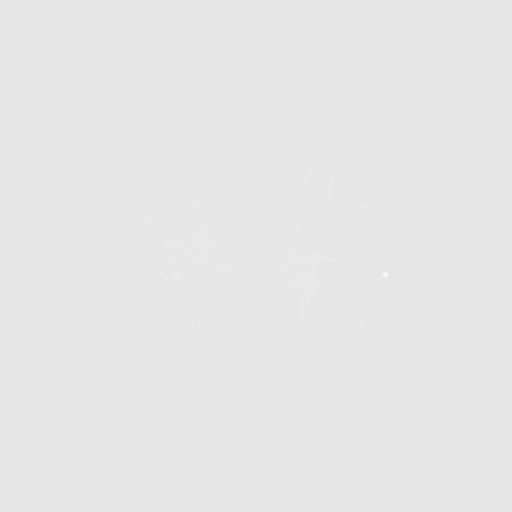
[frame 187/280  lung]
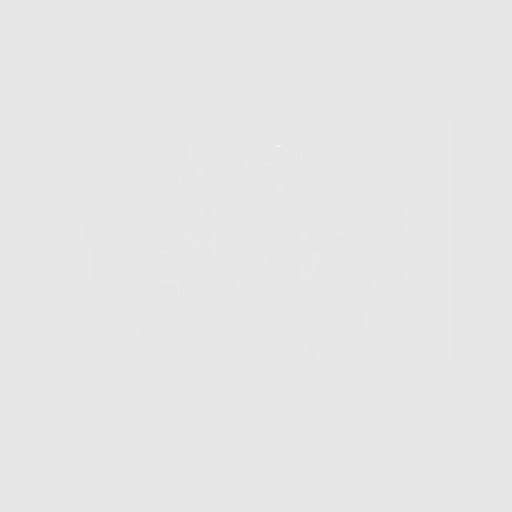
[frame 218/280  lung]
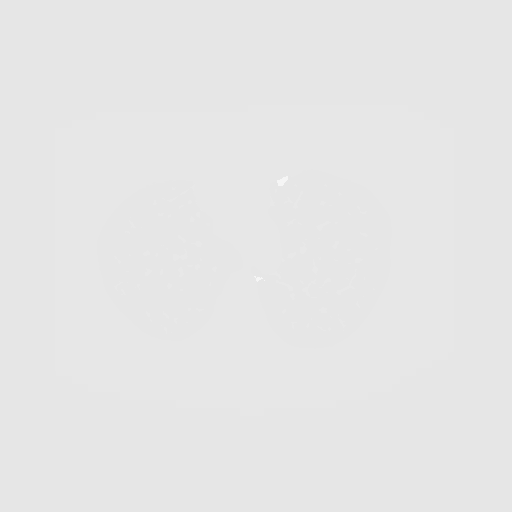
[frame 249/280  mediastinal]
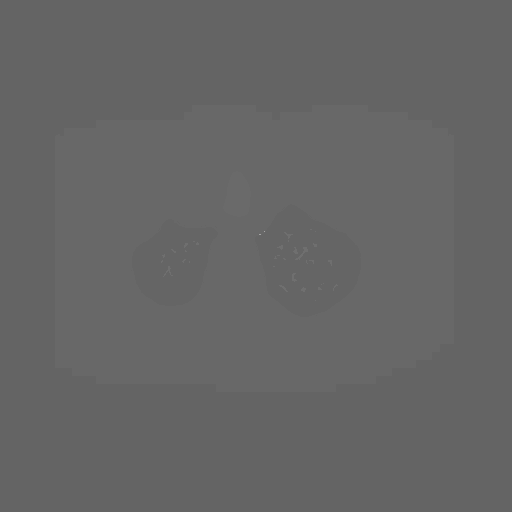
[frame 249/280  lung]
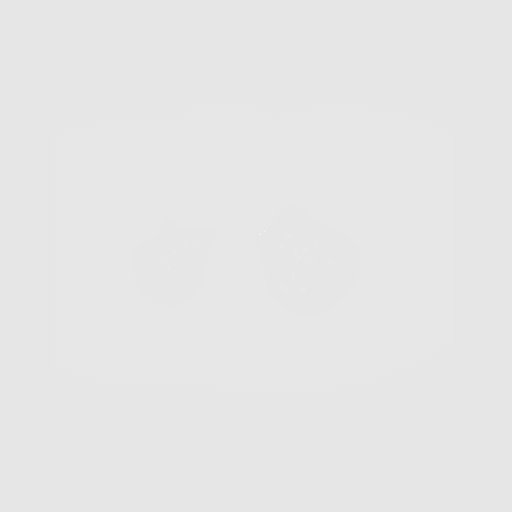
[frame 280/280  lung]
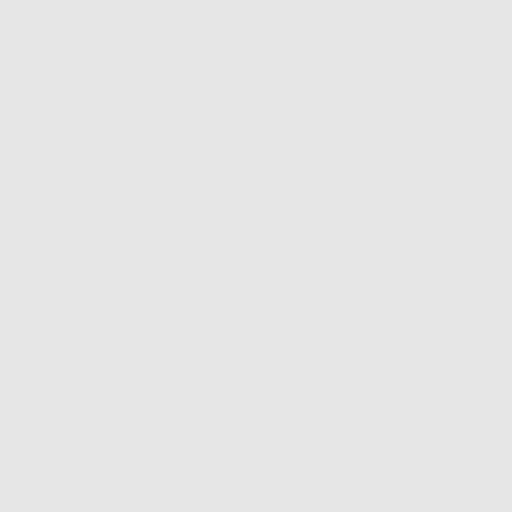

[10 of 20 positions shown; findings below may reference images not displayed]

FINDINGS: Cardiovascular: Normal heart size. No pericardial effusion.
Calcification in the LAD coronary artery.

Mediastinum/Nodes: No enlarged mediastinal, hilar, or axillary lymph
nodes. Thyroid gland, trachea, and esophagus demonstrate no
significant findings.

Lungs/Pleura: Mild changes of centrilobular emphysema. Hyperdense
branching filling defect within peripheral bronchial within the
posterolateral left upper lobe is identified compatible with a
benign process.

Upper Abdomen: No acute abnormality.

Musculoskeletal: No chest wall mass or suspicious bone lesions
identified.
IMPRESSION: 1. Lung-RADS 1, negative. Continue annual screening with low-dose
chest CT without contrast in 12 months.
2.  Emphysema ([id number]).
3. LAD coronary artery calcification.

## 2019-12-09 ENCOUNTER — Ambulatory Visit
Admission: RE | Admit: 2019-12-09 | Discharge: 2019-12-09 | Disposition: A | Discharge status: Home / Self Care | Source: Ambulatory Visit | Attending: Nurse Practitioner | Admitting: Nurse Practitioner

## 2019-12-09 ENCOUNTER — Other Ambulatory Visit: Payer: Self-pay

## 2019-12-09 DIAGNOSIS — Z981 Arthrodesis status: Secondary | ICD-10-CM | POA: Diagnosis present

## 2019-12-15 ENCOUNTER — Telehealth (INDEPENDENT_AMBULATORY_CARE_PROVIDER_SITE_OTHER): Admitting: Psychiatry

## 2019-12-15 ENCOUNTER — Other Ambulatory Visit: Payer: Self-pay

## 2019-12-15 ENCOUNTER — Other Ambulatory Visit: Payer: Self-pay | Admitting: Psychiatry

## 2019-12-15 ENCOUNTER — Encounter: Payer: Self-pay | Admitting: Psychiatry

## 2019-12-15 DIAGNOSIS — F3175 Bipolar disorder, in partial remission, most recent episode depressed: Secondary | ICD-10-CM

## 2019-12-15 DIAGNOSIS — F900 Attention-deficit hyperactivity disorder, predominantly inattentive type: Secondary | ICD-10-CM

## 2019-12-15 DIAGNOSIS — R413 Other amnesia: Secondary | ICD-10-CM | POA: Diagnosis not present

## 2019-12-15 MED ORDER — LISDEXAMFETAMINE DIMESYLATE 20 MG PO CAPS: 20.0000 mg | ORAL_CAPSULE | Freq: Every day | ORAL | 0 refills | Status: DC

## 2019-12-15 MED ORDER — PROPRANOLOL HCL 10 MG PO TABS: 10.0000 mg | ORAL_TABLET | Freq: Three times a day (TID) | ORAL | 1 refills | Status: DC | PRN

## 2019-12-15 NOTE — Progress Notes (Signed)
Provider Location : [facility] Patient Location : Home  Virtual Visit via Video Note  I connected with Bailey Flores on 12/15/19 at  3:20 PM EDT by a video enabled telemedicine application and verified that I am speaking with the correct person using two identifiers.   I discussed the limitations of evaluation and management by telemedicine and the availability of in person appointments. The patient expressed understanding and agreed to proceed.      I discussed the assessment and treatment plan with the patient. The patient was provided an opportunity to ask questions and all were answered. The patient agreed with the plan and demonstrated an understanding of the instructions.   The patient was advised to call back or seek an in-person evaluation if the symptoms worsen or if the condition fails to improve as anticipated.   [facility] MD OP Progress Note  12/15/2019 5:21 PM Cookie Pore  MRN:  [id number]  Chief Complaint:  Chief Complaint    Follow-up     HPI: Bailey Flores is a 59 year old female, married, employed, lives in [place] has a history of bipolar disorder, ADHD, cognitive disorder, COPD, hypertension, chronic pain was evaluated by telemedicine today.  Patient today reports she is currently struggling with right shoulder joint pain.  She reports she has upcoming appointment scheduled for surgery in August.  She also may need a spinal surgery soon.  She is in a lot of pain.  She has limited mobility of her right arm and has to depend on her husband to do a lot of chores and also ADLs.  This does affect her.  She reports she has been having mood lability recently.  Patient reports she also had neuropsychological testing done recently and was told she may not have ADHD.  She reports she had 2 previous testing which showed that she had ADHD.  She understands this is conflicting.  She however would like to get off of Vyvanse if possible.  Patient reports sleep as  okay except for the pain which does make it restless on and off.  Patient denies any suicidality, homicidality or perceptual disturbances.  She is compliant on all of her medications.  Denies side effects.  She is interested in possibly starting psychotherapy sessions.  She understands she had previously declined this and would like to restart psychotherapy sessions if possible.  Patient and her husband recently moved to a new home and reports the relocation is also a stressor for her even though it is a good change and she likes the new home that she bought.  Patient denies any other concerns today.  Visit Diagnosis:    ICD-10-CM   1. Bipolar disorder, in partial remission, most recent episode depressed (HCC)  F31.75 propranolol (INDERAL) 10 MG tablet  2. Attention deficit hyperactivity disorder (ADHD), predominantly inattentive type  F90.0 lisdexamfetamine (VYVANSE) 20 MG capsule  3. Memory problem  R41.3     Past Psychiatric History: I have reviewed past psychiatric history from my progress note on 08/10/2017  Past Medical History:  Past Medical History:  Diagnosis Date  . Arthritis    hands  . Asthma   . Barrett's esophagus   . Cancer [facility])    neuroendocrine ca  . COPD (chronic obstructive pulmonary disease) ([place])   . Depression   . GERD (gastroesophageal reflux disease)   . Hypertension   . Motion sickness    reading in car  . 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 Orthodontics    permanent  retainers, top and bottom  . Pneumonia 2017, 2020  . Sleep apnea     Past Surgical History:  Procedure Laterality Date  . ABDOMINAL HYSTERECTOMY    . ANTERIOR CERVICAL DECOMPRESSION/DISCECTOMY FUSION 4 LEVELS N/A 04/09/2019   Procedure: ANTERIOR CERVICAL DECOMPRESSION/DISCECTOMY FUSION 5 LEVELS C3-T1;  Surgeon: Meade Maw, MD;  Location: [facility];  Service: Neurosurgery;  Laterality: N/A;  . BIOPSY  03/17/2019   Procedure: BIOPSY;  Surgeon: [name], MD;   Location: [facility];  Service: Endoscopy;;  . BOWEL RESECTION  07/19/2012  . CESAREAN SECTION     x3  . COLONOSCOPY WITH PROPOFOL N/A 12/02/2019   Procedure: COLONOSCOPY WITH PROPOFOL;  Surgeon: [name], MD;  Location[facility] ENDOSCOPY;  Service: Endoscopy;  Laterality: N/A;  . ESOPHAGOGASTRODUODENOSCOPY (EGD) WITH PROPOFOL N/A 03/05/2017   Procedure: ESOPHAGOGASTRODUODENOSCOPY (EGD) WITH PROPOFOL;  Surgeon: [name], MD;  Location: [facility];  Service: Endoscopy;  Laterality: N/A;  . ESOPHAGOGASTRODUODENOSCOPY (EGD) WITH PROPOFOL N/A 03/30/2017   REPEAT IN 03/2019  . ESOPHAGOGASTRODUODENOSCOPY (EGD) WITH PROPOFOL N/A 03/17/2019   Procedure: ESOPHAGOGASTRODUODENOSCOPY (EGD) WITH PROPOFOL;  Surgeon: [name], MD;  Location: [facility];  Service: Endoscopy;  Laterality: N/A;  . HAND SURGERY Right   . TRANSFORAMINAL LUMBAR INTERBODY FUSION (TLIF) WITH PEDICLE SCREW FIXATION 1 LEVEL N/A 12/05/2017   Procedure: TRANSFORAMINAL LUMBAR INTERBODY FUSION (TLIF) WITH PEDICLE SCREW FIXATION 1 LEVEL-L4-5;  Surgeon: Meade Maw, MD;  Location: [facility] OR[facility];  Service: Neurosurgery;  Laterality: N/A;    Family Psychiatric History: I have reviewed family psychiatric history from my progress note on 08/10/2017  Family History:  Family History  Problem Relation Age of Onset  . Breast cancer Cousin        mat cousin    Social History: I have reviewed social history from my progress note on 08/10/2017 Social History   Socioeconomic History  . Marital status: Married    Spouse name: Not on file  . Number of children: Not on file  . Years of education: Not on file  . Highest education level: Not on file  Occupational History  . Not on file  Tobacco Use  . Smoking status: Former Smoker    Packs/day: 2.00    Years: 37.00    Pack years: 74.00    Types: Cigarettes    Quit date: 07/19/2012    Years since quitting: 7.4  . Smokeless tobacco: Never Used  Vaping Use   . Vaping Use: Never used  Substance and Sexual Activity  . Alcohol use: Yes    Comment: 2 drinks/mo  . Drug use: No  . Sexual activity: Yes  Other Topics Concern  . Not on file  Social History Narrative  . Not on file   Social Determinants of Health   Financial Resource Strain:   . Difficulty of Paying Living Expenses:   Food Insecurity:   . Worried About Charity fundraiser in the Last Year:   . Arboriculturist in the Last Year:   Transportation Needs:   . Film/video editor (Medical):   Marland Kitchen Lack of Transportation (Non-Medical):   Physical Activity:   . Days of Exercise per Week:   . Minutes of Exercise per Session:   Stress:   . Feeling of Stress :   Social Connections:   . Frequency of Communication with Friends and Family:   . Frequency of Social Gatherings with Friends and Family:   . Attends Religious Services:   . Active  Member of Clubs or Organizations:   . Attends Archivist Meetings:   Marland Kitchen Marital Status:     Allergies: No Known Allergies  Metabolic Disorder Labs: Lab Results  Component Value Date   HGBA1C 5.9 (H) 06/13/2017   MPG 122.63 06/13/2017   Lab Results  Component Value Date   PROLACTIN 4.6 (L) 06/13/2017   Lab Results  Component Value Date   CHOL 183 06/13/2017   TRIG 80 06/13/2017   HDL 92 06/13/2017   CHOLHDL 2.0 06/13/2017   VLDL 16 06/13/2017   LDLCALC 75 06/13/2017   Lab Results  Component Value Date   TSH 1.205 06/13/2017    Therapeutic Level Labs: No results found for: LITHIUM No results found for: VALPROATE No components found for:  CBMZ  Current Medications: Current Outpatient Medications  Medication Sig Dispense Refill  . acetaminophen (TYLENOL) 650 MG CR tablet Take 1,300 mg by mouth 2 (two) times daily as needed (when taking hydrocodone).     Marland Kitchen albuterol (PROVENTIL HFA;VENTOLIN HFA) 108 (90 Base) MCG/ACT inhaler Inhale 1-2 puffs into the lungs every 4 (four) hours as needed for wheezing or shortness of  breath.     Marland Kitchen aspirin 81 MG chewable tablet Chew 81 mg by mouth daily.    Marland Kitchen azithromycin (ZITHROMAX) 500 MG tablet Take by mouth. Takes 3 times a week    . azithromycin (ZITHROMAX) 500 MG tablet Take by mouth.    Marland Kitchen buPROPion (WELLBUTRIN SR) 200 MG 12 hr tablet TAKE 1 TABLET TWICE A DAY 180 tablet 3  . celecoxib (CELEBREX) 100 MG capsule Take 1 capsule (100 mg total) by mouth 2 (two) times daily. (Patient taking differently: Take 200 mg by mouth 2 (two) times daily. ) 60 capsule 0  . Cholecalciferol (VITAMIN D3) 25 MCG (1000 UT) CAPS Take 1,000 Units by mouth at bedtime.     Mariane Baumgarten Sodium (DSS) 100 MG CAPS Take by mouth.    . ethambutol (MYAMBUTOL) 400 MG tablet 3 1/2 three times a week    . Fluticasone-Umeclidin-Vilant (TRELEGY ELLIPTA) 100-62.5-25 MCG/INH AEPB Inhale 1 puff into the lungs daily. 28 each 0  . gabapentin (NEURONTIN) 300 MG capsule Take 600 mg by mouth 3 (three) times daily.     Marland Kitchen lamoTRIgine (LAMICTAL) 200 MG tablet TAKE 1 TABLET TWICE A DAY (DOSE INCREASE) 180 tablet 3  . methocarbamol (ROBAXIN) 500 MG tablet Take 2 tablets (1,000 mg total) by mouth every 6 (six) hours. 80 tablet 0  . montelukast (SINGULAIR) 10 MG tablet Take 10 mg by mouth at bedtime.     Marland Kitchen omeprazole (PRILOSEC) 40 MG capsule Take 40 mg by mouth daily.     Marland Kitchen oxyCODONE (OXY IR/ROXICODONE) 5 MG immediate release tablet Take 1 tablet (5 mg total) by mouth every 3 (three) hours as needed for moderate pain ((score 4 to 6)). (Patient taking differently: Take 5 mg by mouth every 3 (three) hours as needed for moderate pain ((score 4 to 6)). Take 2 tablet by mouth bid) 30 tablet 0  . rifampin (RIFADIN) 300 MG capsule 600 mg. Three times a week    . valACYclovir (VALTREX) 1000 MG tablet Take 2,000 mg by mouth 2 (two) times daily as needed (herpes simplex).     . valsartan-hydrochlorothiazide (DIOVAN-HCT) 160-12.5 MG tablet Take 1 tablet by mouth at bedtime.     . diphenhydrAMINE (BENADRYL) 25 MG tablet Take 25 mg by  mouth daily as needed for allergies. (Patient not taking: Reported on 12/15/2019)    .  Fluticasone-Umeclidin-Vilant (TRELEGY ELLIPTA) 100-62.5-25 MCG/INH AEPB Inhale 1 puff into the lungs daily. 180 each 3  . ibuprofen (ADVIL) 800 MG tablet Take by mouth. (Patient not taking: Reported on 12/15/2019)    . ipratropium-albuterol (DUONEB) 0.5-2.5 (3) MG/3ML SOLN Inhale 3 mLs into the lungs every 4 (four) hours as needed (Only uses with cold symptoms).     . lisdexamfetamine (VYVANSE) 20 MG capsule Take 1 capsule (20 mg total) by mouth daily. 30 capsule 0  . propranolol (INDERAL) 10 MG tablet Take 1 tablet (10 mg total) by mouth 3 (three) times daily as needed. For severe anxiety attacks, agitation 90 tablet 1  . rifampin (RIFADIN) 300 MG capsule Take by mouth. (Patient not taking: Reported on 12/15/2019)    . SUPREP BOWEL PREP KIT 17.5-3.13-1.6 GM/177ML SOLN Take by mouth as directed. (Patient not taking: Reported on 12/15/2019)    . traMADol (ULTRAM) 50 MG tablet Take 50 mg by mouth every 6 (six) hours as needed. (Patient not taking: Reported on 12/15/2019)    . traZODone (DESYREL) 50 MG tablet Take 0.5-1 tablets (25-50 mg total) by mouth at bedtime as needed for sleep. (Patient not taking: Reported on 12/15/2019) 90 tablet 0   No current facility-administered medications for this visit.     Musculoskeletal: Strength & Muscle Tone: UTA Gait & Station: normal Patient leans: N/A  Psychiatric Specialty Exam: Review of Systems  Musculoskeletal: Positive for arthralgias and neck pain.       Rt.shoulder pain  Psychiatric/Behavioral: Negative for agitation, behavioral problems, confusion, decreased concentration, dysphoric mood, hallucinations, self-injury, sleep disturbance and suicidal ideas. The patient is not nervous/anxious and is not hyperactive.   All other systems reviewed and are negative.   There were no vitals taken for this visit.There is no height or weight on file to calculate BMI.  General  Appearance: Casual  Eye Contact:  UTA due to connection problem  Speech:  Clear and Coherent  Volume:  Normal  Mood:  Euthymic  Affect:  Congruent  Thought Process:  Goal Directed and Descriptions of Associations: Intact  Orientation:  Full (Time, Place, and Person)  Thought Content: Logical   Suicidal Thoughts:  No  Homicidal Thoughts:  No  Memory:  Immediate;   Fair Recent;   Fair Remote;   Fair  Judgement:  Fair  Insight:  Fair  Psychomotor Activity:  Normal  Concentration:  Concentration: Fair and Attention Span: Fair  Recall:  [employer] of Knowledge: Fair  Language: Fair  Akathisia:  No  Handed:  Right  AIMS (if indicated): UTA  Assets:  Communication Skills Housing Intimacy Social Support Talents/Skills  ADL's:  Intact  Cognition: WNL  Sleep:  Fair   Screenings: PHQ2-9     Pulmonary Rehab from 06/25/2017 in [facility]  PHQ-2 Total Score 4  PHQ-9 Total Score 19       Assessment and Plan: [facility] is a 59 year old Hispanic female who has a history of bipolar disorder, ADHD, cognitive disorder, COPD, GERD was evaluated by telemedicine today.  Patient with psychosocial stressors of multiple health problems, pain, recent surgery, upcoming surgery and the current pandemic.  Patient continues to struggle with mood lability more so because of her current pain.  Discussed plan as noted below.  Plan Bipolar disorder in partial remission Continue Lamictal 200 mg p.o. twice daily Wellbutrin SR 200 mg p.o. twice daily Trazodone 25 to 50 mg p.o. nightly as needed We will start propranolol 10 mg p.o. 3  times daily as needed for severe anxiety/agitation. Patients mood symptoms likely due to pain. Will recommend sufficient pain management. Will refer patient for CBT.  Patient is already on polypharmacy and will not recommend adding more medications at this time.  ADHD-stable Patient reports she had recent testing done which showed she may  not have ADHD even though she had 2 prior testing which shows she did have ADHD.  She however would like to get off of Vyvanse if possible. We will reduce Vyvanse to 20 mg p.o. daily and advised patient to stop taking it in  4 weeks. I have reviewed [place] controlled substance database. Patient to sign a release to obtain recent neuropsychological testing.  Memory problems-improving We will monitor closely.  She will continue to follow-up with her neurologist.  Follow-up in clinic in 3 to 4 weeks or sooner if needed.  I have spent atleast 20 minutes non face to face with patient today. More than 50 % of the time was spent for preparing to see the patient ( e.g., review of test, records ), obtaining and to review and separately obtained history , ordering medications and test ,psychoeducation and supportive psychotherapy and care coordination,as well as documenting clinical information in electronic health record. This note was generated in part or whole with voice recognition software. Voice recognition is usually quite accurate but there are transcription errors that can and very often do occur. I apologize for any typographical errors that were not detected and corrected.      [name], MD 12/15/2019, 5:21 PM

## 2019-12-19 ENCOUNTER — Other Ambulatory Visit: Payer: Self-pay | Admitting: Orthopedic Surgery

## 2019-12-29 ENCOUNTER — Encounter
Admission: RE | Admit: 2019-12-29 | Discharge: 2019-12-29 | Disposition: A | Discharge status: Home / Self Care | Source: Ambulatory Visit | Attending: Orthopedic Surgery | Admitting: Orthopedic Surgery

## 2019-12-29 ENCOUNTER — Other Ambulatory Visit: Payer: Self-pay

## 2019-12-29 ENCOUNTER — Other Ambulatory Visit

## 2019-12-29 DIAGNOSIS — Z01818 Encounter for other preprocedural examination: Secondary | ICD-10-CM | POA: Diagnosis present

## 2019-12-29 DIAGNOSIS — Z01810 Encounter for preprocedural cardiovascular examination: Secondary | ICD-10-CM

## 2019-12-29 LAB — URINALYSIS, ROUTINE W REFLEX MICROSCOPIC
Bilirubin Urine: NEGATIVE
Glucose, UA: NEGATIVE mg/dL
Hgb urine dipstick: NEGATIVE
Ketones, ur: NEGATIVE mg/dL
Leukocytes,Ua: NEGATIVE
Nitrite: NEGATIVE
Protein, ur: NEGATIVE mg/dL
Specific Gravity, Urine: 1.017 (ref 1.005–1.030)
pH: 5 (ref 5.0–8.0)

## 2019-12-29 LAB — TYPE AND SCREEN
ABO/RH(D): A POS
Antibody Screen: NEGATIVE

## 2019-12-29 LAB — SURGICAL PCR SCREEN
MRSA, PCR: NEGATIVE
Staphylococcus aureus: NEGATIVE

## 2019-12-29 NOTE — Patient Instructions (Signed)
Your procedure is scheduled on: Mon. 8/16 Report to Day Surgery. To find out your arrival time please call [phone] between 1PM - 3PM on Friday 8/13.  Remember: Instructions that are not followed completely may result in serious medical risk,  up to and including death, or upon the discretion of your surgeon and anesthesiologist your  surgery may need to be rescheduled.     _X__ 1. Do not eat food after midnight the night before your procedure.                 No gum chewing or hard candies. You may drink clear liquids up to 2 hours                 before you are scheduled to arrive for your surgery- DO not drink clear                 liquids within 2 hours of the start of your surgery.                 Clear Liquids include:  water, apple juice without pulp, clear Gatorade, G2 or                  Gatorade Zero (avoid Red/Purple/Blue), Black Coffee or Tea (Do not add                 anything to coffee or tea). ___x__2.   Complete the carbohydrate drink provided to you, 2 hours before arrival.  __X__2.  On the morning of surgery brush your teeth with toothpaste and water, you                may rinse your mouth with mouthwash if you wish.  Do not swallow any toothpaste of mouthwash.     _X__ 3.  No Alcohol for 24 hours before or after surgery.   ___ 4.  Do Not Smoke or use e-cigarettes For 24 Hours Prior to Your Surgery.                 Do not use any chewable tobacco products for at least 6 hours prior to                 Surgery.  ___  5.  Do not use any recreational drugs (marijuana, cocaine, heroin, ecstacy, MDMA or other)                For at least one week prior to your surgery.  Combination of these drugs with anesthesia                May have life threatening results.  ____  6.  Bring all medications with you on the day of surgery if instructed.   _x___  7.  Notify your doctor if there is any change in your medical condition      (cold, fever,  infections).     Do not wear jewelry, make-up, hairpins, clips or nail polish. Do not wear lotions, powders, or perfumes.  Do not shave 48 hours prior to surgery.  Do not bring valuables to the hospital.    [facility] is not responsible for any belongings or valuables.  Contacts, dentures or bridgework may not be worn into surgery. Leave your suitcase in the car. After surgery it may be brought to your room. For patients admitted to the hospital, discharge time is determined by your treatment team.   Patients discharged the day of  surgery will not be allowed to drive home.   Make arrangements for someone to be with you for the first 24 hours of your Same Day Discharge.    Please read over the following fact sheets that you were given:   Incentive Spriometry  __x__ Take these medicines the morning of surgery with A SIP OF WATER:    1. acetaminophen (TYLENOL) 650 MG CR tablet  If needed  2. buPROPion (WELLBUTRIN SR) 200 MG 12 hr tablet  3. celecoxib (CELEBREX) 200 MG capsule  4.gabapentin (NEURONTIN) 300 MG capsule  5.lamoTRIgine (LAMICTAL) 200 MG tablet  6.methocarbamol (ROBAXIN) 500 MG tablet              7.omeprazole (PRILOSEC) 40 MG capsule night before and morning of surgery              8.oxyCODONE (OXY IR/ROXICODONE) 5 MG immediate release tablet              9.propranolol (INDERAL) 10 MG tablet if needed ____ Fleet Enema (as directed)   __x__ Use CHG Soap (or wipes) as directed  __x__ Use Benzoyl Peroxide Gel as instructed  ___x_ Use inhalers on the day of surgery albuterol (PROVENTIL HFA;VENTOLIN HFA) 108 (90 Base) MCG/ACT inhaler bring with you. Fluticasone-Umeclidin-Vilant (TRELEGY ELLIPTA) 100-62.5-25 MCG/INH AEPB ____ Stop metformin 2 days prior to surgery    ____ Take 1/2 of usual insulin dose the night before surgery. No insulin the morning          of surgery.   __x__ Stop aspirin today  __x__ Stop Anti-inflammatories  No ibuprofen, aleve after today    May take tylenol   __x__ Stop supplements until after surgery.  Milk Thistle 150 MG CAPS todayx  ____ Bring C-Pap to the hospital.

## 2019-12-31 ENCOUNTER — Other Ambulatory Visit
Admission: RE | Admit: 2019-12-31 | Discharge: 2019-12-31 | Disposition: A | Discharge status: Home / Self Care | Source: Ambulatory Visit | Attending: Orthopedic Surgery | Admitting: Orthopedic Surgery

## 2019-12-31 ENCOUNTER — Other Ambulatory Visit: Payer: Self-pay

## 2019-12-31 DIAGNOSIS — Z01812 Encounter for preprocedural laboratory examination: Secondary | ICD-10-CM | POA: Diagnosis present

## 2019-12-31 DIAGNOSIS — Z20822 Contact with and (suspected) exposure to covid-19: Secondary | ICD-10-CM | POA: Insufficient documentation

## 2020-01-01 ENCOUNTER — Other Ambulatory Visit

## 2020-01-01 LAB — SARS CORONAVIRUS 2 (TAT 6-24 HRS): SARS Coronavirus 2: NEGATIVE

## 2020-01-05 ENCOUNTER — Inpatient Hospital Stay: Admitting: Anesthesiology

## 2020-01-05 ENCOUNTER — Observation Stay

## 2020-01-05 ENCOUNTER — Inpatient Hospital Stay
Admission: RE | Admit: 2020-01-05 | Discharge: 2020-01-06 | DRG: 483 | Disposition: A | Discharge status: Home Health | Attending: Orthopedic Surgery | Admitting: Orthopedic Surgery

## 2020-01-05 ENCOUNTER — Other Ambulatory Visit: Payer: Self-pay

## 2020-01-05 ENCOUNTER — Inpatient Hospital Stay

## 2020-01-05 ENCOUNTER — Encounter
Admission: RE | Disposition: A | Discharge status: Home Health | Payer: Self-pay | Source: Home / Self Care | Attending: Orthopedic Surgery

## 2020-01-05 ENCOUNTER — Encounter: Payer: Self-pay | Admitting: Orthopedic Surgery

## 2020-01-05 DIAGNOSIS — J449 Chronic obstructive pulmonary disease, unspecified: Secondary | ICD-10-CM | POA: Diagnosis present

## 2020-01-05 DIAGNOSIS — Z981 Arthrodesis status: Secondary | ICD-10-CM

## 2020-01-05 DIAGNOSIS — G4730 Sleep apnea, unspecified: Secondary | ICD-10-CM | POA: Diagnosis present

## 2020-01-05 DIAGNOSIS — Z79891 Long term (current) use of opiate analgesic: Secondary | ICD-10-CM

## 2020-01-05 DIAGNOSIS — Z79899 Other long term (current) drug therapy: Secondary | ICD-10-CM

## 2020-01-05 DIAGNOSIS — Z9049 Acquired absence of other specified parts of digestive tract: Secondary | ICD-10-CM | POA: Diagnosis not present

## 2020-01-05 DIAGNOSIS — Z90710 Acquired absence of both cervix and uterus: Secondary | ICD-10-CM

## 2020-01-05 DIAGNOSIS — Z87891 Personal history of nicotine dependence: Secondary | ICD-10-CM

## 2020-01-05 DIAGNOSIS — F319 Bipolar disorder, unspecified: Secondary | ICD-10-CM | POA: Diagnosis present

## 2020-01-05 DIAGNOSIS — Z9851 Tubal ligation status: Secondary | ICD-10-CM

## 2020-01-05 DIAGNOSIS — Z8249 Family history of ischemic heart disease and other diseases of the circulatory system: Secondary | ICD-10-CM | POA: Diagnosis not present

## 2020-01-05 DIAGNOSIS — Z85858 Personal history of malignant neoplasm of other endocrine glands: Secondary | ICD-10-CM | POA: Diagnosis not present

## 2020-01-05 DIAGNOSIS — M19019 Primary osteoarthritis, unspecified shoulder: Secondary | ICD-10-CM | POA: Diagnosis present

## 2020-01-05 DIAGNOSIS — F909 Attention-deficit hyperactivity disorder, unspecified type: Secondary | ICD-10-CM | POA: Diagnosis present

## 2020-01-05 DIAGNOSIS — Z818 Family history of other mental and behavioral disorders: Secondary | ICD-10-CM

## 2020-01-05 DIAGNOSIS — Z825 Family history of asthma and other chronic lower respiratory diseases: Secondary | ICD-10-CM

## 2020-01-05 DIAGNOSIS — K219 Gastro-esophageal reflux disease without esophagitis: Secondary | ICD-10-CM | POA: Diagnosis present

## 2020-01-05 DIAGNOSIS — Z419 Encounter for procedure for purposes other than remedying health state, unspecified: Secondary | ICD-10-CM

## 2020-01-05 DIAGNOSIS — Z96619 Presence of unspecified artificial shoulder joint: Secondary | ICD-10-CM

## 2020-01-05 DIAGNOSIS — I10 Essential (primary) hypertension: Secondary | ICD-10-CM | POA: Diagnosis present

## 2020-01-05 DIAGNOSIS — M19011 Primary osteoarthritis, right shoulder: Principal | ICD-10-CM | POA: Diagnosis present

## 2020-01-05 HISTORY — PX: TOTAL SHOULDER ARTHROPLASTY: SHX126

## 2020-01-05 SURGERY — ARTHROPLASTY, SHOULDER, TOTAL
Anesthesia: General | Site: Shoulder | Laterality: Right

## 2020-01-05 MED ORDER — VANCOMYCIN HCL 1000 MG IV SOLR
INTRAVENOUS | Status: AC
  Filled 2020-01-05: qty 1000

## 2020-01-05 MED ORDER — SENNOSIDES-DOCUSATE SODIUM 8.6-50 MG PO TABS: 1.0000 | ORAL_TABLET | Freq: Every evening | ORAL | Status: DC | PRN

## 2020-01-05 MED ORDER — LIDOCAINE HCL (PF) 1 % IJ SOLN
INTRAMUSCULAR | Status: AC
  Filled 2020-01-05: qty 5

## 2020-01-05 MED ORDER — ROCURONIUM BROMIDE 100 MG/10ML IV SOLN
INTRAVENOUS | Status: DC | PRN
  Administered 2020-01-05: 50 mg via INTRAVENOUS
  Administered 2020-01-05: 30 mg via INTRAVENOUS

## 2020-01-05 MED ORDER — BUPIVACAINE-EPINEPHRINE (PF) 0.25% -1:200000 IJ SOLN
INTRAMUSCULAR | Status: AC
  Filled 2020-01-05: qty 30

## 2020-01-05 MED ORDER — THROMBIN 5000 UNITS EX SOLR
CUTANEOUS | Status: AC
  Filled 2020-01-05: qty 5000

## 2020-01-05 MED ORDER — ALBUTEROL SULFATE (2.5 MG/3ML) 0.083% IN NEBU: 2.5000 mg | INHALATION_SOLUTION | RESPIRATORY_TRACT | Status: DC | PRN

## 2020-01-05 MED ORDER — METOCLOPRAMIDE HCL 5 MG/ML IJ SOLN: 5.0000 mg | Freq: Three times a day (TID) | INTRAMUSCULAR | Status: DC | PRN

## 2020-01-05 MED ORDER — OXYCODONE HCL 5 MG PO TABS
10.0000 mg | ORAL_TABLET | Freq: Two times a day (BID) | ORAL | Status: DC
  Administered 2020-01-05 – 2020-01-06 (×2): 10 mg via ORAL
  Filled 2020-01-05 (×2): qty 2

## 2020-01-05 MED ORDER — CHLORHEXIDINE GLUCONATE 0.12 % MT SOLN: 15.0000 mL | Freq: Once | OROMUCOSAL | Status: AC

## 2020-01-05 MED ORDER — SUGAMMADEX SODIUM 200 MG/2ML IV SOLN
INTRAVENOUS | Status: DC | PRN
  Administered 2020-01-05: 200 mg via INTRAVENOUS

## 2020-01-05 MED ORDER — CLINDAMYCIN PHOSPHATE 900 MG/50ML IV SOLN
INTRAVENOUS | Status: AC
  Filled 2020-01-05: qty 50

## 2020-01-05 MED ORDER — CEFAZOLIN SODIUM-DEXTROSE 1-4 GM/50ML-% IV SOLN
1.0000 g | Freq: Four times a day (QID) | INTRAVENOUS | Status: AC
  Administered 2020-01-05 – 2020-01-06 (×2): 1 g via INTRAVENOUS
  Filled 2020-01-05 (×2): qty 50

## 2020-01-05 MED ORDER — PROPRANOLOL HCL 20 MG PO TABS: 10.0000 mg | ORAL_TABLET | Freq: Three times a day (TID) | ORAL | Status: DC | PRN

## 2020-01-05 MED ORDER — METHOCARBAMOL 500 MG PO TABS
1000.0000 mg | ORAL_TABLET | Freq: Three times a day (TID) | ORAL | Status: DC
  Administered 2020-01-05 – 2020-01-06 (×2): 1000 mg via ORAL
  Filled 2020-01-05 (×2): qty 2

## 2020-01-05 MED ORDER — BISACODYL 10 MG RE SUPP: 10.0000 mg | Freq: Every day | RECTAL | Status: DC | PRN

## 2020-01-05 MED ORDER — HYDROCHLOROTHIAZIDE 12.5 MG PO CAPS
12.5000 mg | ORAL_CAPSULE | Freq: Every day | ORAL | Status: DC
  Administered 2020-01-05: 12.5 mg via ORAL
  Filled 2020-01-05: qty 1

## 2020-01-05 MED ORDER — ONDANSETRON HCL 4 MG/2ML IJ SOLN: 4.0000 mg | Freq: Once | INTRAMUSCULAR | Status: AC

## 2020-01-05 MED ORDER — BUPIVACAINE LIPOSOME 1.3 % IJ SUSP
INTRAMUSCULAR | Status: DC | PRN
Start: 2020-01-05 — End: 2020-01-05
  Administered 2020-01-05: 20 mL

## 2020-01-05 MED ORDER — ONDANSETRON HCL 4 MG/2ML IJ SOLN
INTRAMUSCULAR | Status: AC
  Administered 2020-01-05: 4 mg via INTRAVENOUS
  Filled 2020-01-05: qty 2

## 2020-01-05 MED ORDER — CEFAZOLIN SODIUM-DEXTROSE 2-4 GM/100ML-% IV SOLN
INTRAVENOUS | Status: AC
  Filled 2020-01-05: qty 100

## 2020-01-05 MED ORDER — VASOPRESSIN 20 UNIT/ML IV SOLN
INTRAVENOUS | Status: DC | PRN
  Administered 2020-01-05: 2 [IU] via INTRAVENOUS

## 2020-01-05 MED ORDER — TRANEXAMIC ACID 1000 MG/10ML IV SOLN
INTRAVENOUS | Status: DC | PRN
  Administered 2020-01-05: 1000 mg via INTRAVENOUS

## 2020-01-05 MED ORDER — DEXAMETHASONE SODIUM PHOSPHATE 10 MG/ML IJ SOLN
INTRAMUSCULAR | Status: AC
  Filled 2020-01-05: qty 1

## 2020-01-05 MED ORDER — RIFAMPIN 300 MG PO CAPS
600.0000 mg | ORAL_CAPSULE | ORAL | Status: DC
  Administered 2020-01-05: 600 mg via ORAL
  Filled 2020-01-05: qty 2

## 2020-01-05 MED ORDER — TRANEXAMIC ACID-NACL 1000-0.7 MG/100ML-% IV SOLN
INTRAVENOUS | Status: AC
  Filled 2020-01-05: qty 100

## 2020-01-05 MED ORDER — ONDANSETRON HCL 4 MG/2ML IJ SOLN: 4.0000 mg | Freq: Four times a day (QID) | INTRAMUSCULAR | Status: DC | PRN

## 2020-01-05 MED ORDER — FLUTICASONE FUROATE-VILANTEROL 100-25 MCG/INH IN AEPB
1.0000 | INHALATION_SPRAY | Freq: Every day | RESPIRATORY_TRACT | Status: DC
  Administered 2020-01-06: 1 via RESPIRATORY_TRACT
  Filled 2020-01-05: qty 28

## 2020-01-05 MED ORDER — VASOPRESSIN 20 UNIT/ML IV SOLN
INTRAVENOUS | Status: AC
  Filled 2020-01-05: qty 1

## 2020-01-05 MED ORDER — MIDAZOLAM HCL 2 MG/2ML IJ SOLN
INTRAMUSCULAR | Status: AC
  Administered 2020-01-05: 1 mg via INTRAVENOUS
  Filled 2020-01-05: qty 2

## 2020-01-05 MED ORDER — IPRATROPIUM-ALBUTEROL 0.5-2.5 (3) MG/3ML IN SOLN: 3.0000 mL | RESPIRATORY_TRACT | Status: DC | PRN

## 2020-01-05 MED ORDER — LIDOCAINE HCL (PF) 2 % IJ SOLN
INTRAMUSCULAR | Status: AC
  Filled 2020-01-05: qty 5

## 2020-01-05 MED ORDER — DOCUSATE SODIUM 100 MG PO CAPS
200.0000 mg | ORAL_CAPSULE | Freq: Every day | ORAL | Status: DC
  Administered 2020-01-06: 200 mg via ORAL
  Filled 2020-01-05: qty 2

## 2020-01-05 MED ORDER — OXYCODONE HCL 5 MG PO TABS
ORAL_TABLET | ORAL | Status: AC
  Filled 2020-01-05: qty 2

## 2020-01-05 MED ORDER — LACTATED RINGERS IV SOLN: INTRAVENOUS | Status: DC

## 2020-01-05 MED ORDER — LAMOTRIGINE 100 MG PO TABS
200.0000 mg | ORAL_TABLET | Freq: Two times a day (BID) | ORAL | Status: DC
  Administered 2020-01-05 – 2020-01-06 (×2): 200 mg via ORAL
  Filled 2020-01-05: qty 2
  Filled 2020-01-05: qty 8
  Filled 2020-01-05: qty 2
  Filled 2020-01-05: qty 8
  Filled 2020-01-05: qty 2

## 2020-01-05 MED ORDER — PHENOL 1.4 % MT LIQD
1.0000 | OROMUCOSAL | Status: DC | PRN
  Filled 2020-01-05: qty 177

## 2020-01-05 MED ORDER — BUPIVACAINE HCL (PF) 0.5 % IJ SOLN
INTRAMUSCULAR | Status: DC | PRN
Start: 2020-01-05 — End: 2020-01-05
  Administered 2020-01-05: 5 mL

## 2020-01-05 MED ORDER — ACETAMINOPHEN 500 MG PO TABS
1000.0000 mg | ORAL_TABLET | Freq: Three times a day (TID) | ORAL | Status: DC
  Administered 2020-01-05 – 2020-01-06 (×3): 1000 mg via ORAL
  Filled 2020-01-05 (×3): qty 2

## 2020-01-05 MED ORDER — FENTANYL CITRATE (PF) 100 MCG/2ML IJ SOLN
25.0000 ug | INTRAMUSCULAR | Status: DC | PRN
  Administered 2020-01-05: 25 ug via INTRAVENOUS

## 2020-01-05 MED ORDER — ONDANSETRON HCL 4 MG/2ML IJ SOLN: 4.0000 mg | Freq: Once | INTRAMUSCULAR | Status: DC | PRN

## 2020-01-05 MED ORDER — VALACYCLOVIR HCL 500 MG PO TABS
2000.0000 mg | ORAL_TABLET | Freq: Two times a day (BID) | ORAL | Status: DC | PRN
  Filled 2020-01-05: qty 4

## 2020-01-05 MED ORDER — PHENYLEPHRINE HCL-NACL 20-0.9 MG/250ML-% IV SOLN: INTRAVENOUS | Status: DC | PRN

## 2020-01-05 MED ORDER — MENTHOL 3 MG MT LOZG
1.0000 | LOZENGE | OROMUCOSAL | Status: DC | PRN
  Filled 2020-01-05: qty 9

## 2020-01-05 MED ORDER — IRBESARTAN 150 MG PO TABS
150.0000 mg | ORAL_TABLET | Freq: Every day | ORAL | Status: DC
  Administered 2020-01-05: 150 mg via ORAL
  Filled 2020-01-05: qty 1

## 2020-01-05 MED ORDER — AZITHROMYCIN 500 MG PO TABS
500.0000 mg | ORAL_TABLET | ORAL | Status: DC
  Administered 2020-01-05: 500 mg via ORAL
  Filled 2020-01-05: qty 1

## 2020-01-05 MED ORDER — ONDANSETRON HCL 4 MG/2ML IJ SOLN
INTRAMUSCULAR | Status: AC
  Filled 2020-01-05: qty 2

## 2020-01-05 MED ORDER — MILK THISTLE 150 MG PO CAPS: 300.0000 mg | ORAL_CAPSULE | Freq: Every day | ORAL | Status: DC

## 2020-01-05 MED ORDER — UMECLIDINIUM BROMIDE 62.5 MCG/INH IN AEPB
1.0000 | INHALATION_SPRAY | Freq: Every day | RESPIRATORY_TRACT | Status: DC
  Administered 2020-01-06: 08:00:00 1 via RESPIRATORY_TRACT
  Filled 2020-01-05: qty 7

## 2020-01-05 MED ORDER — BUPROPION HCL ER (SR) 100 MG PO TB12
200.0000 mg | ORAL_TABLET | Freq: Two times a day (BID) | ORAL | Status: DC
  Administered 2020-01-06: 200 mg via ORAL
  Filled 2020-01-05 (×3): qty 2

## 2020-01-05 MED ORDER — SODIUM CHLORIDE 0.9 % IV SOLN: INTRAVENOUS | Status: DC

## 2020-01-05 MED ORDER — CLINDAMYCIN PHOSPHATE 900 MG/50ML IV SOLN: 900.0000 mg | INTRAVENOUS | Status: DC

## 2020-01-05 MED ORDER — HYDROMORPHONE HCL 1 MG/ML IJ SOLN
0.5000 mg | INTRAMUSCULAR | Status: DC | PRN
  Administered 2020-01-06 (×5): 1 mg via INTRAVENOUS
  Filled 2020-01-05 (×5): qty 1

## 2020-01-05 MED ORDER — TRANEXAMIC ACID-NACL 1000-0.7 MG/100ML-% IV SOLN
INTRAVENOUS | Status: AC
  Administered 2020-01-05: 1000 mg via INTRAVENOUS
  Filled 2020-01-05: qty 100

## 2020-01-05 MED ORDER — FENTANYL CITRATE (PF) 100 MCG/2ML IJ SOLN
INTRAMUSCULAR | Status: AC
  Filled 2020-01-05: qty 2

## 2020-01-05 MED ORDER — LISDEXAMFETAMINE DIMESYLATE 20 MG PO CAPS
20.0000 mg | ORAL_CAPSULE | Freq: Every day | ORAL | Status: DC
  Administered 2020-01-06: 20 mg via ORAL
  Filled 2020-01-05: qty 1

## 2020-01-05 MED ORDER — TRANEXAMIC ACID-NACL 1000-0.7 MG/100ML-% IV SOLN: 1000.0000 mg | Freq: Once | INTRAVENOUS | Status: AC

## 2020-01-05 MED ORDER — METOCLOPRAMIDE HCL 5 MG/ML IJ SOLN: 5.0000 mg | Freq: Once | INTRAMUSCULAR | Status: AC

## 2020-01-05 MED ORDER — BUPIVACAINE HCL (PF) 0.5 % IJ SOLN
INTRAMUSCULAR | Status: AC
  Filled 2020-01-05: qty 10

## 2020-01-05 MED ORDER — BUPIVACAINE LIPOSOME 1.3 % IJ SUSP
INTRAMUSCULAR | Status: AC
  Filled 2020-01-05: qty 20

## 2020-01-05 MED ORDER — FENTANYL CITRATE (PF) 100 MCG/2ML IJ SOLN
INTRAMUSCULAR | Status: AC
  Administered 2020-01-05: 50 ug via INTRAVENOUS
  Filled 2020-01-05: qty 2

## 2020-01-05 MED ORDER — PROPOFOL 10 MG/ML IV BOLUS
INTRAVENOUS | Status: AC
  Filled 2020-01-05: qty 20

## 2020-01-05 MED ORDER — DEXAMETHASONE SODIUM PHOSPHATE 10 MG/ML IJ SOLN
INTRAMUSCULAR | Status: DC | PRN
  Administered 2020-01-05: 10 mg via INTRAVENOUS

## 2020-01-05 MED ORDER — ONDANSETRON HCL 4 MG PO TABS: 4.0000 mg | ORAL_TABLET | Freq: Four times a day (QID) | ORAL | Status: DC | PRN

## 2020-01-05 MED ORDER — ETHAMBUTOL HCL 400 MG PO TABS
1400.0000 mg | ORAL_TABLET | ORAL | Status: DC
  Administered 2020-01-05: 1400 mg via ORAL
  Filled 2020-01-05: qty 3.5

## 2020-01-05 MED ORDER — MIDAZOLAM HCL 2 MG/2ML IJ SOLN
INTRAMUSCULAR | Status: DC | PRN
  Administered 2020-01-05: 2 mg via INTRAVENOUS

## 2020-01-05 MED ORDER — PHENYLEPHRINE HCL-NACL 10-0.9 MG/250ML-% IV SOLN
INTRAVENOUS | Status: DC | PRN
Start: 2020-01-05 — End: 2020-01-05
  Administered 2020-01-05 (×2): 40 ug/min via INTRAVENOUS

## 2020-01-05 MED ORDER — ASPIRIN EC 325 MG PO TBEC
325.0000 mg | DELAYED_RELEASE_TABLET | Freq: Every day | ORAL | Status: DC
  Administered 2020-01-06: 325 mg via ORAL
  Filled 2020-01-05: qty 1

## 2020-01-05 MED ORDER — CEFAZOLIN SODIUM-DEXTROSE 2-3 GM-%(50ML) IV SOLR
INTRAVENOUS | Status: DC | PRN
  Administered 2020-01-05: 2 g via INTRAVENOUS

## 2020-01-05 MED ORDER — NEOMYCIN-POLYMYXIN B GU 40-200000 IR SOLN
Status: AC
  Filled 2020-01-05: qty 20

## 2020-01-05 MED ORDER — VITAMIN D3 25 MCG (1000 UNIT) PO TABS
1000.0000 [IU] | ORAL_TABLET | Freq: Every day | ORAL | Status: DC
  Administered 2020-01-05: 1000 [IU] via ORAL
  Filled 2020-01-05 (×3): qty 1

## 2020-01-05 MED ORDER — OXYCODONE HCL 5 MG PO TABS
10.0000 mg | ORAL_TABLET | ORAL | Status: DC | PRN
  Administered 2020-01-05: 10 mg via ORAL

## 2020-01-05 MED ORDER — KETOROLAC TROMETHAMINE 30 MG/ML IJ SOLN
INTRAMUSCULAR | Status: AC
  Filled 2020-01-05: qty 1

## 2020-01-05 MED ORDER — FENTANYL CITRATE (PF) 250 MCG/5ML IJ SOLN
INTRAMUSCULAR | Status: AC
  Filled 2020-01-05: qty 5

## 2020-01-05 MED ORDER — VANCOMYCIN HCL 1000 MG IV SOLR
INTRAVENOUS | Status: DC | PRN
  Administered 2020-01-05: 1000 mg

## 2020-01-05 MED ORDER — MONTELUKAST SODIUM 10 MG PO TABS
10.0000 mg | ORAL_TABLET | Freq: Every day | ORAL | Status: DC
  Administered 2020-01-05: 10 mg via ORAL
  Filled 2020-01-05: qty 1

## 2020-01-05 MED ORDER — FLUTICASONE-UMECLIDIN-VILANT 100-62.5-25 MCG/INH IN AEPB: 1.0000 | INHALATION_SPRAY | Freq: Every day | RESPIRATORY_TRACT | Status: DC

## 2020-01-05 MED ORDER — PANTOPRAZOLE SODIUM 40 MG PO TBEC
80.0000 mg | DELAYED_RELEASE_TABLET | Freq: Every day | ORAL | Status: DC
  Administered 2020-01-06: 80 mg via ORAL
  Filled 2020-01-05: qty 2

## 2020-01-05 MED ORDER — METOCLOPRAMIDE HCL 10 MG PO TABS: 5.0000 mg | ORAL_TABLET | Freq: Three times a day (TID) | ORAL | Status: DC | PRN

## 2020-01-05 MED ORDER — ACETAMINOPHEN 10 MG/ML IV SOLN
INTRAVENOUS | Status: AC
  Filled 2020-01-05: qty 100

## 2020-01-05 MED ORDER — ACETAMINOPHEN 10 MG/ML IV SOLN
INTRAVENOUS | Status: DC | PRN
  Administered 2020-01-05: 1000 mg via INTRAVENOUS

## 2020-01-05 MED ORDER — OXYCODONE HCL 5 MG PO TABS
5.0000 mg | ORAL_TABLET | ORAL | Status: DC | PRN
  Administered 2020-01-05: 10 mg via ORAL
  Filled 2020-01-05: qty 2

## 2020-01-05 MED ORDER — VALSARTAN-HYDROCHLOROTHIAZIDE 160-12.5 MG PO TABS: 1.0000 | ORAL_TABLET | Freq: Every day | ORAL | Status: DC

## 2020-01-05 MED ORDER — GELATIN ABSORBABLE 100 CM EX MISC
CUTANEOUS | Status: AC
  Filled 2020-01-05: qty 1

## 2020-01-05 MED ORDER — FENTANYL CITRATE (PF) 100 MCG/2ML IJ SOLN
INTRAMUSCULAR | Status: DC | PRN
  Administered 2020-01-05 (×2): 100 ug via INTRAVENOUS
  Administered 2020-01-05: 50 ug via INTRAVENOUS
  Administered 2020-01-05: 100 ug via INTRAVENOUS

## 2020-01-05 MED ORDER — CHLORHEXIDINE GLUCONATE 0.12 % MT SOLN
OROMUCOSAL | Status: AC
  Administered 2020-01-05: 15 mL via OROMUCOSAL
  Filled 2020-01-05: qty 15

## 2020-01-05 MED ORDER — ALUM & MAG HYDROXIDE-SIMETH 200-200-20 MG/5ML PO SUSP: 30.0000 mL | ORAL | Status: DC | PRN

## 2020-01-05 MED ORDER — CEFAZOLIN SODIUM-DEXTROSE 1-4 GM/50ML-% IV SOLN
INTRAVENOUS | Status: AC
  Administered 2020-01-05: 1 g via INTRAVENOUS
  Filled 2020-01-05: qty 50

## 2020-01-05 MED ORDER — DIPHENHYDRAMINE HCL 12.5 MG/5ML PO ELIX: 12.5000 mg | ORAL_SOLUTION | ORAL | Status: DC | PRN

## 2020-01-05 MED ORDER — MIDAZOLAM HCL 2 MG/2ML IJ SOLN
INTRAMUSCULAR | Status: AC
  Filled 2020-01-05: qty 2

## 2020-01-05 MED ORDER — MIDAZOLAM HCL 2 MG/2ML IJ SOLN: 1.0000 mg | Freq: Once | INTRAMUSCULAR | Status: AC

## 2020-01-05 MED ORDER — ORAL CARE MOUTH RINSE: 15.0000 mL | Freq: Once | OROMUCOSAL | Status: AC

## 2020-01-05 MED ORDER — GABAPENTIN 300 MG PO CAPS
600.0000 mg | ORAL_CAPSULE | Freq: Three times a day (TID) | ORAL | Status: DC
  Administered 2020-01-05 – 2020-01-06 (×2): 600 mg via ORAL
  Filled 2020-01-05 (×3): qty 2

## 2020-01-05 MED ORDER — FENTANYL CITRATE (PF) 100 MCG/2ML IJ SOLN
INTRAMUSCULAR | Status: AC
  Administered 2020-01-05: 25 ug via INTRAVENOUS
  Filled 2020-01-05: qty 2

## 2020-01-05 MED ORDER — METOCLOPRAMIDE HCL 5 MG/ML IJ SOLN
INTRAMUSCULAR | Status: AC
  Administered 2020-01-05: 5 mg via INTRAVENOUS
  Filled 2020-01-05: qty 2

## 2020-01-05 MED ORDER — FENTANYL CITRATE (PF) 100 MCG/2ML IJ SOLN: 50.0000 ug | Freq: Once | INTRAMUSCULAR | Status: AC

## 2020-01-05 MED ORDER — PROPOFOL 10 MG/ML IV BOLUS
INTRAVENOUS | Status: DC | PRN
  Administered 2020-01-05: 150 mg via INTRAVENOUS

## 2020-01-05 MED ORDER — ONDANSETRON HCL 4 MG/2ML IJ SOLN
INTRAMUSCULAR | Status: DC | PRN
  Administered 2020-01-05: 4 mg via INTRAVENOUS

## 2020-01-05 MED ORDER — ROCURONIUM BROMIDE 10 MG/ML (PF) SYRINGE
PREFILLED_SYRINGE | INTRAVENOUS | Status: AC
  Filled 2020-01-05: qty 10

## 2020-01-05 MED ORDER — PHENYLEPHRINE HCL (PRESSORS) 10 MG/ML IV SOLN
INTRAVENOUS | Status: DC | PRN
  Administered 2020-01-05 (×7): 100 ug via INTRAVENOUS

## 2020-01-05 SURGICAL SUPPLY — 86 items
BLADE SAGITTAL WIDE XTHICK NO (BLADE) ×3 IMPLANT
BLADE SURG 15 STRL LF DISP TIS (BLADE) ×1 IMPLANT
BLADE SURG 15 STRL SS (BLADE) ×2
BOWL CEMENT MIX W/ADAPTER (MISCELLANEOUS) IMPLANT
CANISTER SUCT 1200ML W/VALVE (MISCELLANEOUS) ×3 IMPLANT
CANISTER SUCT 3000ML PPV (MISCELLANEOUS) ×6 IMPLANT
CEMENT BONE R 1X40 (Cement) ×3 IMPLANT
CHLORAPREP W/TINT 26 (MISCELLANEOUS) ×3 IMPLANT
CNTNR SPEC 2.5X3XGRAD LEK (MISCELLANEOUS) ×1
COMP GLENOID AEQUALIS CL S30 (Shoulder) ×3 IMPLANT
COMPONENT GLENOD AEQLS CL S30 (Shoulder) ×1 IMPLANT
CONT SPEC 4OZ STER OR WHT (MISCELLANEOUS) ×2
CONTAINER SPEC 2.5X3XGRAD LEK (MISCELLANEOUS) ×1 IMPLANT
COOLER POLAR GLACIER W/PUMP (MISCELLANEOUS) ×3 IMPLANT
COVER BACK TABLE REUSABLE LG (DRAPES) ×3 IMPLANT
COVER WAND RF STERILE (DRAPES) ×3 IMPLANT
DRAPE 3/4 80X56 (DRAPES) ×6 IMPLANT
DRAPE IMP U-DRAPE 54X76 (DRAPES) ×6 IMPLANT
DRAPE INCISE IOBAN 66X45 STRL (DRAPES) ×6 IMPLANT
DRAPE U-SHAPE 47X51 STRL (DRAPES) ×3 IMPLANT
DRSG OPSITE POSTOP 4X8 (GAUZE/BANDAGES/DRESSINGS) ×3 IMPLANT
DRSG TEGADERM 2-3/8X2-3/4 SM (GAUZE/BANDAGES/DRESSINGS) ×3 IMPLANT
ELECT REM PT RETURN 9FT ADLT (ELECTROSURGICAL) ×3
ELECTRODE REM PT RTRN 9FT ADLT (ELECTROSURGICAL) ×1 IMPLANT
GAUZE XEROFORM 1X8 LF (GAUZE/BANDAGES/DRESSINGS) ×3 IMPLANT
GLOVE BIO SURGEON STRL SZ7.5 (GLOVE) ×3 IMPLANT
GLOVE BIOGEL PI IND STRL 8 (GLOVE) ×2 IMPLANT
GLOVE BIOGEL PI INDICATOR 8 (GLOVE) ×4
GLOVE SURG ORTHO 8.0 STRL STRW (GLOVE) ×3 IMPLANT
GOWN STRL REUS W/ TWL LRG LVL3 (GOWN DISPOSABLE) ×2 IMPLANT
GOWN STRL REUS W/ TWL XL LVL3 (GOWN DISPOSABLE) ×1 IMPLANT
GOWN STRL REUS W/TWL LRG LVL3 (GOWN DISPOSABLE) ×4
GOWN STRL REUS W/TWL XL LVL3 (GOWN DISPOSABLE) ×2
GUIDE GLENOID PATIENT (MISCELLANEOUS) ×3 IMPLANT
GUIDEWIRE GLENOID 2.5X220 (WIRE) ×3 IMPLANT
HEAD HUMERAL HIGH OS 46X17 (Shoulder) ×1 IMPLANT
HEMOSTAT SURGICEL 2X14 (HEMOSTASIS) IMPLANT
HEMOSTAT SURGICEL 2X3 (HEMOSTASIS) ×6 IMPLANT
HEMOVAC 400CC 10FR (MISCELLANEOUS) IMPLANT
HOLDER FOLEY CATH W/STRAP (MISCELLANEOUS) ×3 IMPLANT
HOOD PEEL AWAY FLYTE STAYCOOL (MISCELLANEOUS) ×9 IMPLANT
HUMERAL HEAD AEQUALIS 46X17 (Shoulder) ×3 IMPLANT
HUMERAL STEM AEQUALIS 3X74 (Shoulder) ×3 IMPLANT
KIT STABILIZATION SHOULDER (MISCELLANEOUS) ×3 IMPLANT
KIT TURNOVER KIT A (KITS) ×3 IMPLANT
MASK FACE SPIDER DISP (MASK) ×3 IMPLANT
MAT ABSORB  FLUID 56X50 GRAY (MISCELLANEOUS) ×2
MAT ABSORB FLUID 56X50 GRAY (MISCELLANEOUS) ×1 IMPLANT
NDL SAFETY ECLIPSE 18X1.5 (NEEDLE) ×1 IMPLANT
NEEDLE HYPO 18GX1.5 SHARP (NEEDLE) ×2
NEEDLE REVERSE CUT 1/2 CRC (NEEDLE) ×3 IMPLANT
NEEDLE SPNL 20GX3.5 QUINCKE YW (NEEDLE) ×3 IMPLANT
NS IRRIG 1000ML POUR BTL (IV SOLUTION) ×3 IMPLANT
PACK SHDR ARTHRO (MISCELLANEOUS) ×3 IMPLANT
PAD ARMBOARD 7.5X6 YLW CONV (MISCELLANEOUS) ×6 IMPLANT
PAD WRAPON POLAR SHDR UNIV (MISCELLANEOUS) ×1 IMPLANT
PULSAVAC PLUS IRRIG FAN TIP (DISPOSABLE) ×3
RETRIEVER SUT HEWSON (MISCELLANEOUS) IMPLANT
SLING ULTRA II LG (MISCELLANEOUS) IMPLANT
SLING ULTRA II M (MISCELLANEOUS) IMPLANT
SOL .9 NS 3000ML IRR  AL (IV SOLUTION) ×2
SOL .9 NS 3000ML IRR UROMATIC (IV SOLUTION) ×1 IMPLANT
SPONGE GAUZE 2X2 8PLY STER LF (GAUZE/BANDAGES/DRESSINGS) ×1
SPONGE GAUZE 2X2 8PLY STRL LF (GAUZE/BANDAGES/DRESSINGS) ×2 IMPLANT
SPONGE LAP 18X18 RF (DISPOSABLE) ×9 IMPLANT
STAPLER SKIN PROX 35W (STAPLE) IMPLANT
STEM HUMERAL AEQUALIS 3X74 (Shoulder) ×1 IMPLANT
STRAP SAFETY 5IN WIDE (MISCELLANEOUS) ×6 IMPLANT
SUT FIBERWIRE #2 38 BLUE 1/2 (SUTURE) ×6
SUT MNCRL AB 4-0 PS2 18 (SUTURE) IMPLANT
SUT TICRON 2-0 30IN 311381 (SUTURE) ×6 IMPLANT
SUT VIC AB 0 CT1 27 (SUTURE) ×2
SUT VIC AB 0 CT1 27XCR 8 STRN (SUTURE) ×1 IMPLANT
SUT VIC AB 2-0 CT2 27 (SUTURE) ×6 IMPLANT
SUTURE FIBERWR #2 38 BLUE 1/2 (SUTURE) ×2 IMPLANT
SYR 20ML LL LF (SYRINGE) ×3 IMPLANT
SYR 30ML LL (SYRINGE) ×6 IMPLANT
SYR 5ML LL (SYRINGE) ×3 IMPLANT
SYR TOOMEY IRRIG 70ML (MISCELLANEOUS) ×3
SYRINGE IRR TOOMEY STRL 70CC (SYRINGE) ×3 IMPLANT
SYRINGE TOOMEY IRRIG 70ML (MISCELLANEOUS) ×1 IMPLANT
TAPE STRIPS DRAPE STRL (GAUZE/BANDAGES/DRESSINGS) ×3 IMPLANT
TAPE TRANSPORE STRL 2 31045 (GAUZE/BANDAGES/DRESSINGS) ×3 IMPLANT
TIP FAN IRRIG PULSAVAC PLUS (DISPOSABLE) ×1 IMPLANT
TRAY FOLEY SLVR 16FR LF STAT (SET/KITS/TRAYS/PACK) ×3 IMPLANT
WRAPON POLAR PAD SHDR UNIV (MISCELLANEOUS) ×3

## 2020-01-05 NOTE — Discharge Instructions (Signed)
Jouri Threat H. Kiasha Bellin, MD ° [facility] ° Phone: [phone] ° Fax: [phone] ° ° °Discharge Instructions after Total Shoulder Replacement ° °  °1. Activity/Sling: You are to be non-weight bearing on operative extremity. A sling/shoulder immobilizer has been provided for you. Only remove the sling to perform the motion exercises (attached) and hygiene/dressing. Active reaching and lifting are not permitted. You will be given further instructions on sling use at your first physical therapy visit and postoperative visit with Dr. Markice Torbert.  ° °2. Dressings: Dressing may be removed at 1st physical therapy visit (~3-4 days after surgery). Afterwards, you may either leave open to air (if no drainage) or cover with dry, sterile dressing. If you have steri-strips on your wound, please do not remove them. They will fall off on their own. You may shower 5 days after surgery. Please pat incision dry. Do not rub or place any shear forces across incision. If there is drainage or any opening of incision after 5 days, please notify our offices immediately.  °  °3. Driving:  Plan on not driving for six weeks. Please note that you are advised NOT to drive while taking narcotic pain medications as you may be impaired and unsafe to drive. °  °4. Medications:  °- You have been provided a prescription for narcotic pain medicine (usually oxycodone). After surgery, take 1-2 narcotic tablets every 4 hours if needed for severe pain. Please start this as soon as you begin to start having pain (if you received a nerve block, start taking as soon as this wears off).  °- A prescription for anti-nausea medication will be provided in case the narcotic medicine causes nausea - take 1 tablet every 6 hours only if nauseated.  °- Take enteric coated aspirin 325 mg once daily for 6 weeks to prevent blood clots. Do not take aspirin if you have an aspirin sensitivity/allergy or asthma or are on an anticoagulant (blood thinner) already. If so, then your  home anticoagulant will be resume and managed - do not take aspirin. °-Take tylenol 1000mg (2 Extra strength or 3 regular strength tablets) every 8 hours for pain. This will reduce the amount of narcotic medication needed. May stop tylenol when you are having minimal pain. °- Take a stool softener (Colace, Dulcolax or Senakot) if you are using narcotic pain medications to help with constipation that is associated with narcotic use. °- DO NOT take ANY nonsteroidal anti-inflammatory pain medications: °Advil, Motrin, Ibuprofen, Aleve, Naproxen, or Naprosyn.  ° °If you are taking prescription medication for anxiety, depression, insomnia, muscle spasm, chronic pain, or for attention deficit disorder you are advised that you are at a higher risk of adverse effects with use of narcotics post-op, including narcotic addiction/dependence, depressed breathing, death. °If you use non-prescribed substances: alcohol, marijuana, cocaine, heroin, methamphetamines, etc., you are at a higher risk of adverse effects with use of narcotics post-op, including narcotic addiction/dependence, depressed breathing, death. °You are advised that taking > 50 morphine milligram equivalents (MME) of narcotic pain medication per day results in twice the risk of overdose or death. For your prescription provided: oxycodone 5 mg - taking more than 6 tablets per day after the first few days of surgery. °  °5. Physical Therapy: 1-2 times per week for ~12 weeks. Therapy typically starts on post operative Day 3 or 4. You have been provided an order for physical therapy. The therapist will provide home exercises. Please contact our offices if this appointment has not been scheduled.  °  °  6. Work: May do light duty/desk job in approximately 2 weeks when off of narcotics, pain is well-controlled, and swelling has decreased if able to function with one arm in sling. Full work may take 6 weeks if light motions and function of both arms is required. Lifting  jobs may require 12 weeks. °  °7. Post-Op Appointments: °Your first post-op appointment will be with Dr. Turki Tapanes in approximately 2 weeks time.  °  °If you find that they have not been scheduled please call the Orthopaedic Appointment front desk at [phone]. ° ° ° ° ° ° ° ° ° ° ° ° ° ° ° ° ° ° ° ° ° ° °Home Exercises °? Perform passive, assisted forward flexion and external rotation (outward turning) °exercises with the operative arm. You were taught these exercises prior to °discharge. Both exercises should be done with the non-operative arm used as the °"therapist arm" while the operative arm remains completely relaxed. ° °? 10 of each exercise should be done 5 times daily, work up to the max degrees ° °Forward Flexion Maximum: 140 deg. °Lie flat on your back, completely relax your operative arm like a wet noodle, and grasp the wrist of the operative shoulder with your opposite hand. °Using the power in your opposite arm, bring the stiff arm up only to the maximum indicated above (90 degrees indicates your arm pointed straight ahead). Start holding it for ten seconds and then work up to where you can hold it for a count of 30. Breathe slowly and deeply while the arm is moved. Repeat this stretch ten times. ° ° °  ° °External rotation Maximum: 40 deg. °External rotation is turning the arm out to the side while your elbow stays close to your body. It is best stretched while you are lying on your back. Hold a cane, yardstick, broom handle, or golf club in both hands. Bend both elbows to a right angle. With °your operative arm completely relaxed, use steady, gentle force from your normal arm to rotate the hand of the stiff shoulder out away from your body. Continue the rotation only to the maximum indicated above (90 degrees indicates your arm pointed straight ahead). Holding it there for a count of 10. Repeat this exercise ten times. ° ° °Caulder Wehner H. Jovan Colligan, MD ° [facility] ° Phone: [phone] ° Fax:  [phone] ° ° °Total Shoulder Arthroplasty Rehabilitation Framework ° °The following is a basic framework from which to work during rehabilitation following a Shoulder Arthroplasty. However, it is critical to communicate with the surgeon in order to be aware of the condition of the tissue at the time of repair, any concomitant procedures that might have been performed, etc, that might impact the progression that is appropriate for each specific patient. ° °1-2x/week for 10-12 weeks. ° ° °Subscapularis Safe Zones established intraoperatively by the surgeon °? These ranges can start on Post-op day 1, but may require a few weeks to achieve depending on °patient comfort °? Supine, passive, well-arm assisted: °? 140/40 Program: Max. forward flexion to 140 degrees ; Max. External rotation to 40 degrees °? No abduction ° °---------------------------------------------------------------------------------------------------------------------- ° °Phase I: Passive Motion - 0-6 weeks post-op ° °Goals: °? PROM - 140 degrees of flexion, ER of 40 by the end of week 6 (see above) °? Decrease pain, Decrease muscle atrophy, Educate regarding joint protection °? Provide the patient with instructions for home exercises (last pages) 5 x per day ° °Precautions: °? Stay within safe zone determined at   surgery (see above) °? Week 1-2: Sling with abduction pillow at all times, removed only for 5x/day exercises, °showering, and dressing °? Week 3-6: Sling while out of home/uncontrolled environment, continue wearing during sleep if patient is an active sleeper. °? Week 3-6: Ok to perform waist level activities WITH ELBOW AT SIDE in front of the body °o Typing, eating utensils, combing hair and washing face with elbow at side °o No lifting, reaching or pulling heavier than coffee cup with elbow at side ° °Teaching: °? Emphasize home, supine, passive well-arm assisted PROM (FF and ER as above) °? Instruct in regular icing techniques or cold  therapy device (use as much as possible out of 24 °hours for 8-10 days) °? Ice packs for 20 - 30 minutes intervals, especially at the end of an exercise session °? Monitor for edema in forearm, hand, or finger ° °Exercises: °? Pendulum exercises °o With the arm hanging, the patient gently swings the hand forward and backward, then °side-to-side, and then clockwise and counterclockwise °? Passive, supine well-arm assisted forward flexion, in front of the plane of the scapula as pain °allows per safe zone above (140/40) °? Active scapular retraction, elevation in sitting or standing °? Active elbow, wrist, hand ROM - Grasping and gripping lightweight objects ° °---------------------------------------------------------------------------------------------------------------------- ° °Phase II: Active Range of Motion (6-10 weeks post-op) ° °Goals: °? Full range of motion by end of week 10. After 6 week physician visit, patient and °therapist can move beyond the safe zones as pain allows. Please notify physician if range of motion is not improving by week 10.  °? Emphasis should be on range of motion before strengthening. °? Improve strength, Decrease pain, Increase functional activities, Scapular stabilization °Precautions: °? No sling use °? No resisted internal rotation until 10 weeks post-op °Teaching: °? Encourage continued stretching at home. Limited only by pain °? Ice after exercise. °Exercises: °? Encourage patient to use smooth, natural movement patterns °? Continue to work on Passive ROM as in Phase I °? Begin AROM and AAROM (using a cane), progressively, to full range of motion °? Assisted forward flexion supine using uninvolved arm to assist - progressing to active motion in a reclined position and then to sitting °? Side lying ER against gravity °? Encourage normal scapular mechanics with active motion °? Add Theraband exercises or light dumbbell weights (2lbs) for flexion, extension, external rotation °?  Scapulothoracic strengthening (prone extension, prone T, etc.) °? Aquatic therapy, if available, can begin no earlier than 1 month post op if wound is completely healed. ° °o Week 1-6: Stay within established safe zone listed above. Passive motion only °o Week 6 +: Shoulder fully submerged - slow, active motions for flexion, elevation, ER/IR °and horizontal abduction/adduction out to scapular plane, range of motion limited by pain °Only. ° °---------------------------------------------------------------------------------------------------------------------- ° °Phase III: Final Strengthening - 10+ weeks ° °Goals: °? If acceptable motion has been achieved (>150 FF, >50 ER, IR T12 or above), then °Maximize strength--otherwise continue with stretching program °? Improve neuromuscular control °? Increase functional activities °Precautions: °? No sudden, forceful resisted IR (e.g. golfing, wood splitting, swimming) until >3 months post-op °Teaching: °? Continue home stretching minimum 1x per day to maintain full range of motion °Exercises: °? Continue to increase difficulty of theraband and dumbbell exercises as tolerated °? Increase resistance exercises - must be light enough weight that >20 reps are achieved per set °? Continue aerobic training as tolerated, and modalities as appropriate °? Continue to progress home program ° ° °  NOTES: °1. With proper exercise, motion, strength, and function continue to improve even after one year. °2. The complication rate after surgery is 5 - 8%. Complications include infection, fracture, °heterotopic bone formation, nerve injury, instability, rotator cuff tear, and tuberosity nonunion. Please look for clinical signs, unusual symptoms, or lack of progress with therapy and report those to Dr. Teliyah Royal. Prefer more communication than less.  °3. The therapy plan above only serves as a guide. Please be aware of specific individualized patient instructions as written on the prescription or  through discussions with the surgeon. °4. Please call Dr. Donnette Macmullen if you have any specific questions or concerns [phone]. °5. The patient’s “Home exercise stretching program” (critical for first 10 weeks) is attached. °  °

## 2020-01-05 NOTE — Op Note (Addendum)
Operative Note    SURGERY DATE: 01/05/2020   PRE-OP DIAGNOSIS:  1. Right shoulder osteoarthritis   POST-OP DIAGNOSIS:  1. Right shoulder osteoarthritis 2. Right biceps tendinopathy   PROCEDURES:  1. Right total shoulder arthroplasty 2. Right biceps tenodesis   SURGEON: Cato Mulligan, MD  ASSISTANTS: Reche Dixon, PA; Gaynelle Arabian, PA-S   ANESTHESIA: Gen + Exparel interscalene block   ESTIMATED BLOOD LOSS: 100cc   TOTAL IV FLUIDS: per anesthesia  IMPLANTS: Tornier Aequalis:  - Perform Cortiloc Pegged Glenoid S30  - Ascend Flex Humeral Stem - Size 3C - Humeral Head 46 x 18mm High Ecc head   INDICATION(S):  Bailey Flores is a 59 y.o. female with chronic shoulder pain. There is clinical and radiographic evidence for the diagnosis of shoulder arthritis. Conservative measures including medications and cortisone injections have not provided adequate relief. After discussion of risks, benefits, and alternatives to surgery, the patient elected to proceed.    OPERATIVE FINDINGS: end-stage glenohumeral arthritis, biceps tendinopathy     OPERATIVE REPORT:   I identified [name] in the pre-operative holding area. Informed consent was obtained and the surgical site was marked. I reviewed the risks and benefits of the proposed surgical intervention and the patient (and/or patient's guardian) wished to proceed. An Exparel interscalene block was administered by the Anesthesia team. The patient was transferred to the operative suite and general anesthesia was administered. The patient was placed in the beach chair position with the head of the bed elevated approximately 45 degrees. All down side pressure points were appropriately padded. Pre-op exam under anesthesia confirmed some stiffness and crepitus. Appropriate IV antibiotics were administered within 30 minutes before incision. Tranexamic acid was also administered after verifying that the patient had no  contraindications. The extremity was then prepped and draped in standard fashion. A time out was performed confirming the correct extremity, correct patient, and correct procedure.   We used the standard deltopectoral incision from the coracoid to ~10cm distal. We found the cephalic vein and took it laterally, and it was protected throughout the case. We opened the deltopectoral interval widely and placed retractors under the CA ligament in the subacromial space and under the deltoid tendon at its insertion. We then abducted and internally rotated the arm and released the underlying bursa between these retractors, taking care not to damage the circumflex branch of the axillary nerve.   Next, we brought the arm back in adduction at slight forward flexion with external rotation. We opened the clavipectoral fascia lateral to the conjoint tendon. We gently palpated the axillary nerve and verified its position and continuity on both sides of the humerus with a Tug test. Note, this test was repeated multiple times during the procedure for nerve localization and confirmed to be intact at the end of the case. We then cauterized the anterior humeral circumflex ("Three sisters") vessels. The arm was then internally rotated, we cut the falciform ligament at approximately 1 cm of the upper portion of the pectoralis major insertion. Next we unroofed the bicipital groove. The biceps tendon was tendinopathic with inflammatory synovium surrounding. We proceeded with a soft tissue biceps tenodesis given the pathology of the tendon.  After opening the biceps tendon sheath all the way to the supraglenoid tubercle, we performed a biceps tenodesis with two #2 TiCron sutures to the upper boarder of the pectoralis major. The proximal portion of the tendon was excised.   At this point we visualized the rotator cuff, it was intact.  We performed a lesser tuberosity osteotomy. Next, we dissected the subscapularis off of the underlying  capsule and then released the inferior capsule from the humerus all the way to the posterior band of the inferior glenohumeral ligament. When this was complete we gently dislocated the shoulder up into the wound. We removed the osteophytes and made our cut with the appropriate inclination in 30 degrees of retroversion.  The humerus was sounded and broached in the standard fashion and a protector plate was placed.  We then turned our attention back to the glenoid. A posterior retractor was used to retract the proximal humerus posteriorly. The remnant of the anterior capsule was excised, exposing the anterior glenoid. We then grasped the remaining stump of the biceps and removed the labrum circumferentially. During the glenoid exposure, the axillary nerve was protected the entire time. Capsular release was extended from the glenoid to past the posterior/inferior quadrant.    A patient-specific guide was used to drill the central guidepin. An appropriately sized reamer was used to ream the glenoid. Peripheral and central holes were drilled. The trial glenoid seated well. Bony endpoint of peripheral holes was verified. We used thrombin soaked surgicell for hemostasis in the peripheral holes. Pressurized cement was used to fill the peripheral holes. The glenoid component was placed and it was well seated without any motion afterwards.   The humerus was trialed with the above components and noted to have satisfactory stability, motion, and subscapularis tension.  Of note, an increased humeral head diameter size was selected to allow for increased height of the implant since trialing with the smaller sized head and lower height did not allow for appropriate tensioning of the implant.  The wound and humeral canal were thoroughly irrigated with pulse lavage. The final humeral component was then inserted. A Hemovac drain was placed. The lesser tuberosity osteotomy was repaired with with a combination of 4 tapes and  sutures around the biceps groove and 1 around the prosthesis. Holes were drilled and suture was passed prior to stem insertion. One suture of #2 TiCron was used to repair the lateral part of the rotator interval.  We again verified the tension on the axillary nerve, appropriate range of motion, stability of the implant, and security of the subscapularis repair. We closed the deltopectoral interval deep to the cephalic vein with a running, 0-Vicryl suture. The skin was closed with 2-0 Vicryl and 4-0 Monocryl and Dermabond. Honeycomb dressing was applied. Patient was extubated and transferred to the post anesthesia care unit in stable condition.   Of note, assistance from a PA was essential to performing the surgery.  PA was present for the entire surgery.  PA assisted with patient positioning, retraction, instrumentation, and wound closure. The surgery would have been more difficult and had longer operative time without PA assistance.    POSTOPERATIVE PLAN: The patient will be admitted with plan for discharge home on POD#1. Perioperative antibiotics x 24 hours. Operative arm to remain in sling at all times except RoM exercises and hygiene. Can perform pendulums, elbow/wrist/hand RoM exercises. Passive RoM allowed to 140 FF and 40 ER. ASA 325mg  x 6 weeks for DVT ppx. Plan for outpatient PT starting on POD #3-4. Patient to return to clinic in ~2 weeks for post-operative appointment.

## 2020-01-05 NOTE — Progress Notes (Signed)
Pt transferred to room assignment, 160, with husband. Report given to Bet LPN. Pt in no acute distress, all belongings transferred with patient.

## 2020-01-05 NOTE — Evaluation (Signed)
Physical Therapy Evaluation Patient Details Name: Bailey Flores MRN: [id number] DOB: [birth year] Today's Date: 01/05/2020   History of Present Illness  Pt is a 59 y.o. female who presents w/ R shoulder OA and is now s/p R total shoulder arthroplasty and R biceps tenodesis. Pt's PMH includes: asthma, sleep apnea, pneunomia, COPD, HTN, PVD, depression, bipolar disorder, GERD, arthritis, neuroendocrine cancer, and barrett's esophagus.  Clinical Impression  Pt was pleasant and motivated to participate during the session. Pt found in supine; reported 7/10 R shoulder pain. Pt able to perform bed mobility w/ min-A for RUE support as well as +1 HHA or bed rail to bring self to sitting. Pt able to perform sit-to-stand and ambulate w/ +1 HHA and CGA; pt appeared generally steady with transfers and ambulation. Pt was able to ambulate 79ft x 3 to and from bathroom; pt took slow, small, bilateral steps likely 2/2 being cautious of RUE location in space. Pt given education on RUE precautions and importance of awareness of RUE location to avoid obstacles during mobility. Pt was able to perform BLE seated exercises w/ min instructional cueing. Pt will benefit from HHPT services upon discharge to safely address deficits listed in patient problem list for decreased caregiver assistance and eventual return to PLOF.     Follow Up Recommendations Home health PT    Equipment Recommendations  None recommended by PT    Recommendations for Other Services       Precautions / Restrictions Precautions Precautions: Shoulder;Fall Type of Shoulder Precautions: NWB RUE Shoulder Interventions: Shoulder sling/immobilizer;Off for dressing/bathing/exercises Precaution Booklet Issued: No Precaution Comments: NWB on operative extremity. Please instruct on pendulums, passive RoM exercises (For anatomic TSA: 140 FF and 40 ER). No Elbow Flexion ROM. Restrictions Weight Bearing Restrictions: Yes RUE Weight Bearing: Non  weight bearing      Mobility  Bed Mobility Overal bed mobility: Needs Assistance Bed Mobility: Supine to Sit     Supine to sit: Min assist     General bed mobility comments: min-A for R UE support and pt required use of bed rails or HHA to come to full sit  Transfers Overall transfer level: Needs assistance Equipment used: 1 person hand held assist Transfers: Sit to/from Stand Sit to Stand: Min assist         General transfer comment: 1x HHA used to come to full stand  Ambulation/Gait Ambulation/Gait assistance: Min guard Gait Distance (Feet): 15 Feet x3 Assistive device: 1 person hand held assist Gait Pattern/deviations: Step-through pattern;Decreased step length - right;Decreased step length - left;Shuffle Gait velocity: decreased   General Gait Details: Pt walked w/ small bilateral step lengths, generally slow likely as a precaution to watch for RUE positioning. Steady on feet throughout w/ no LOB  Stairs            Wheelchair Mobility    Modified Rankin (Stroke Patients Only)       Balance Overall balance assessment: Needs assistance Sitting-balance support: Feet unsupported;No upper extremity supported Sitting balance-Leahy Scale: Good       Standing balance-Leahy Scale: Good Standing balance comment: Appeared steady in standing, good ability to shift weight through BLE including turns and backwards steps w/ no use of AD                             Pertinent Vitals/Pain Pain Assessment: 0-10 Pain Score: 7  Pain Location: R shoulder Pain Descriptors / Indicators: Aching;Grimacing;Sore Pain Intervention(s): Limited activity  within patient's tolerance;Monitored during session;Premedicated before session;Repositioned    Home Living Family/patient expects to be discharged to:: Private residence Living Arrangements: Spouse/significant other Available Help at Discharge: Family;Available 24 hours/day Type of Home: House Home Access:  Stairs to enter   [employer] of Steps: 1 Home Layout: Multi-level; 8 steps to 2nd floor (main bed/bath) Home Equipment: Walker - 4 wheels;Cane - quad;Cane - single point;Adaptive equipment Additional Comments: Pt recently moved to split level home    Prior Function Level of Independence: Needs assistance   Gait / Transfers Assistance Needed: Ind amb without an AD community distances, no fall history  ADL's / Homemaking Assistance Needed: Spouse assists as needed with bathing, dressing, and grooming and reaching cabinets        Hand Dominance   Dominant Hand: Right    Extremity/Trunk Assessment   Upper Extremity Assessment Upper Extremity Assessment: Generalized weakness;RUE deficits/detail RUE Deficits / Details: Decreased light touch; no active wrist/finger motion at this time RUE Sensation: decreased light touch    Lower Extremity Assessment Lower Extremity Assessment: Generalized weakness       Communication   Communication: No difficulties  Cognition Arousal/Alertness: Awake/alert Behavior During Therapy: WFL for tasks assessed/performed Overall Cognitive Status: Within Functional Limits for tasks assessed                                        General Comments      Exercises Total Joint Exercises Ankle Circles/Pumps: AROM;Both;10 reps Quad Sets: Strengthening;Both;10 reps Gluteal Sets: Strengthening;Both;10 reps Hip ABduction/ADduction: Strengthening;Both;AROM;10 reps Straight Leg Raises: AROM;Strengthening;Both;10 reps Long Arc Quad: AROM;Strengthening;Both;10 reps Other Exercises Other Exercises: Education on BLE exercises to keep legs moving while sitting / in bed Other Exercises: Education on RUE precautions; education and practice w/ avoiding obstancles w/ RUE during mobility   Assessment/Plan    PT Assessment Patient needs continued PT services  PT Problem List Decreased strength;Decreased range of motion;Decreased  mobility;Decreased knowledge of precautions;Pain;Decreased knowledge of use of DME       PT Treatment Interventions DME instruction;Gait training;Stair training;Functional mobility training;Therapeutic activities;Patient/family education;Therapeutic exercise    PT Goals (Current goals can be found in the Care Plan section)  Acute Rehab PT Goals Patient Stated Goal: Use shoulder more with less pain PT Goal Formulation: With patient Time For Goal Achievement: 01/18/20 Potential to Achieve Goals: Good    Frequency BID   Barriers to discharge        Co-evaluation               AM-PAC PT "6 Clicks" Mobility  Outcome Measure Help needed turning from your back to your side while in a flat bed without using bedrails?: A Little Help needed moving from lying on your back to sitting on the side of a flat bed without using bedrails?: A Little Help needed moving to and from a bed to a chair (including a wheelchair)?: A Little Help needed standing up from a chair using your arms (e.g., wheelchair or bedside chair)?: A Little Help needed to walk in hospital room?: A Little Help needed climbing 3-5 steps with a railing? : A Little 6 Click Score: 18    End of Session Equipment Utilized During Treatment: Gait belt Activity Tolerance: Patient tolerated treatment well Patient left: Other (comment) (left on toilet, husband present, pt instructed to use call bell for nurses when finished) Nurse Communication: Mobility status;Weight bearing status;Precautions;  pt left in bathroom and instructed to ring call bell  PT Visit Diagnosis: Muscle weakness (generalized) (M62.81);Pain;Difficulty in walking, not elsewhere classified (R26.2) Pain - Right/Left: Right Pain - part of body: Shoulder    Time: 7425-5258 PT Time Calculation (min) (ACUTE ONLY): 50 min   Charges:             Khalie Wince SPT 01/05/20, 5:40 PM

## 2020-01-05 NOTE — H&P (Signed)
Paper H&P to be scanned into permanent record. H&P reviewed. No significant changes noted.  

## 2020-01-05 NOTE — Anesthesia Procedure Notes (Signed)
Anesthesia Regional Block: Interscalene brachial plexus block   Pre-Anesthetic Checklist: ,, timeout performed, Correct Patient, Correct Site, Correct Laterality, Correct Procedure, Correct Position, site marked, Risks and benefits discussed,  Surgical consent,  Pre-op evaluation,  At surgeon's request and post-op pain management  Laterality: Right  Prep: chloraprep, alcohol swabs       Needles:  Injection technique: Single-shot  Needle Type: Stimiplex     Needle Length: 5cm  Needle Gauge: 22     Additional Needles:   Procedures:, nerve stimulator,,, ultrasound used (permanent image in chart),,,,   Nerve Stimulator or Paresthesia:  Response: biceps flexion,   Additional Responses:   Narrative:  Start time: 01/05/2020 8:56 AM End time: 01/05/2020 7:41 AM Injection made incrementally with aspirations every 5 mL.  Performed by: Personally  Anesthesiologist: Alvin Critchley, MD  Additional Notes: Functioning IV was confirmed and monitors were applied.  A 6mm 22ga Stimuplex needle was used. Sterile prep and drape,hand hygiene and sterile gloves were used.  Negative aspiration and negative test dose prior to incremental administration of local anesthetic. The patient tolerated the procedure well.  Malfunction of nerve stimulator, but bevel was visualized just lateral to the nerve bundle and no pain on injection.

## 2020-01-05 NOTE — Anesthesia Preprocedure Evaluation (Addendum)
Anesthesia Evaluation  Patient identified by MRN, date of birth, ID band Patient awake    Reviewed: Allergy & Precautions, H&P , NPO status , Patient's Chart, lab work & pertinent test results  History of Anesthesia Complications Negative for: history of anesthetic complications  Airway Mallampati: III  TM Distance: <3 FB Neck ROM: limited    Dental  (+) Chipped, Poor Dentition   Pulmonary neg pulmonary ROS, neg shortness of breath, asthma , sleep apnea , pneumonia, resolved, COPD, former smoker,           Cardiovascular Exercise Tolerance: Good hypertension, (-) angina+ Peripheral Vascular Disease  (-) Past MI and (-) DOE      Neuro/Psych PSYCHIATRIC DISORDERS Depression Bipolar Disorder  Neuromuscular disease    GI/Hepatic Neg liver ROS, GERD  Medicated and Controlled,  Endo/Other  negative endocrine ROS  Renal/GU      Musculoskeletal  (+) Arthritis ,   Abdominal   Peds negative pediatric ROS (+)  Hematology negative hematology ROS (+)   Anesthesia Other Findings Past Medical History: No date: Arthritis     Comment:  hands No date: Asthma No date: Barrett's esophagus No date: Cancer (HCC)     Comment:  neuroendocrine ca No date: COPD (chronic obstructive pulmonary disease) (HCC) No date: Depression No date: GERD (gastroesophageal reflux disease) No date: Hypertension No date: Motion sickness     Comment:  reading in car 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[place]) 2017: Pneumonia No date: Sleep apnea  Past Surgical History: No date: ABDOMINAL HYSTERECTOMY 07/19/2012: BOWEL RESECTION No date: CESAREAN SECTION     Comment:  x3 03/05/2017: ESOPHAGOGASTRODUODENOSCOPY (EGD) WITH PROPOFOL; N/A     Comment:  Procedure: ESOPHAGOGASTRODUODENOSCOPY (EGD) WITH               PROPOFOL;  Surgeon: [name], MD;  Location: [place];  Service: Endoscopy;  Laterality: N/A; 03/30/2017:  ESOPHAGOGASTRODUODENOSCOPY (EGD) WITH PROPOFOL; N/A     Comment:  REPEAT IN 03/2019 No date: HAND SURGERY; Right  BMI    Body Mass Index:  28.01 kg/m      Reproductive/Obstetrics negative OB ROS                             Anesthesia Physical  Anesthesia Plan  ASA: III  Anesthesia Plan: General   Post-op Pain Management:  Regional for Post-op pain   Induction: Intravenous  PONV Risk Score and Plan:   Airway Management Planned: Oral ETT and Video Laryngoscope Planned  Additional Equipment:   Intra-op Plan:   Post-operative Plan: Extubation in OR  Informed Consent: I have reviewed the patients History and Physical, chart, labs and discussed the procedure including the risks, benefits and alternatives for the proposed anesthesia with the patient or authorized representative who has indicated his/her understanding and acceptance.     Dental Advisory Given  Plan Discussed with: Anesthesiologist, CRNA and Surgeon  Anesthesia Plan Comments: (Patient consented for risks of anesthesia including but not limited to:  - adverse reactions to medications - damage to teeth, lips or other oral mucosa - sore throat or hoarseness - Damage to heart, brain, lungs or loss of life  Patient voiced understanding.   Right interscalene block discussed with patient and risks explained which include cardiac arrest, seizures, infection, nerve damage, pneumothorax, and block not working well .  Patient understands risks and wishes to proceed  with the block.)       Anesthesia Quick Evaluation

## 2020-01-05 NOTE — Anesthesia Procedure Notes (Signed)
Procedure Name: Intubation Performed by: Genevie Ann, CRNA Pre-anesthesia Checklist: Patient identified, Emergency Drugs available, Suction available, Patient being monitored and Timeout performed Patient Re-evaluated:Patient Re-evaluated prior to induction Oxygen Delivery Method: Circle system utilized Preoxygenation: Pre-oxygenation with 100% oxygen Induction Type: IV induction Ventilation: Mask ventilation without difficulty Laryngoscope Size: McGraph and 3 Grade View: Grade I Tube size: 6.5 mm Number of attempts: 1 Airway Equipment and Method: Stylet Placement Confirmation: ETT inserted through vocal cords under direct vision,  breath sounds checked- equal and bilateral and positive ETCO2 Dental Injury: Teeth and Oropharynx as per pre-operative assessment

## 2020-01-05 NOTE — Transfer of Care (Signed)
Immediate Anesthesia Transfer of Care Note  Patient: Bailey Flores  Procedure(s) Performed: Right total shoulder arthroplasty, biceps tenodesis (Right Shoulder)  Patient Location: PACU  Anesthesia Type:General  Level of Consciousness: awake, alert  and oriented  Airway & Oxygen Therapy: Patient Spontanous Breathing  Post-op Assessment: Report given to RN  Post vital signs: Reviewed and stable  Last Vitals:  Vitals Value Taken Time  BP 102/53 01/05/20 1125  Temp 37.5 C 01/05/20 1125  Pulse 91 01/05/20 1135  Resp 20 01/05/20 1135  SpO2 97 % 01/05/20 1135  Vitals shown include unvalidated device data.  Last Pain:  Vitals:   01/05/20 1125  TempSrc:   PainSc: 0-No pain         Complications: No complications documented.

## 2020-01-05 NOTE — Progress Notes (Signed)
Patient complaining of warmth and numbness to her right hand after having right shoulder arthroplasty today. MD notified. MD stated warmth and numbness is coming from the nerve black he used during surgery.

## 2020-01-06 ENCOUNTER — Encounter: Payer: Self-pay | Admitting: Orthopedic Surgery

## 2020-01-06 LAB — BASIC METABOLIC PANEL
Anion gap: 7 (ref 5–15)
BUN: 12 mg/dL (ref 8–23)
CO2: 28 mmol/L (ref 22–32)
Calcium: 8.4 mg/dL — ABNORMAL LOW (ref 8.9–10.3)
Chloride: 105 mmol/L (ref 98–111)
Creatinine, Ser: 0.49 mg/dL (ref 0.44–1.00)
GFR calc Af Amer: >60 mL/min (ref >60)
GFR calc non Af Amer: >60 mL/min (ref >60)
Glucose, Bld: 105 mg/dL — ABNORMAL HIGH (ref 70–99)
Potassium: 3.7 mmol/L (ref 3.5–5.1)
Sodium: 140 mmol/L (ref 135–145)

## 2020-01-06 LAB — CBC
HCT: 32 % — ABNORMAL LOW (ref 36.0–46.0)
Hemoglobin: 10.5 g/dL — ABNORMAL LOW (ref 12.0–15.0)
MCH: 30.9 pg (ref 26.0–34.0)
MCHC: 32.8 g/dL (ref 30.0–36.0)
MCV: 94.1 fL (ref 80.0–100.0)
Platelets: 264 10*3/uL (ref 150–400)
RBC: 3.4 MIL/uL — ABNORMAL LOW (ref 3.87–5.11)
RDW: 14.5 % (ref 11.5–15.5)
WBC: 7.6 10*3/uL (ref 4.0–10.5)
nRBC: 0 % (ref 0.0–0.2)

## 2020-01-06 MED ORDER — ASPIRIN 325 MG PO TBEC: 325.0000 mg | DELAYED_RELEASE_TABLET | Freq: Every day | ORAL | 0 refills | Status: AC

## 2020-01-06 MED ORDER — OXYCODONE HCL 5 MG PO TABS: 5.0000 mg | ORAL_TABLET | ORAL | 0 refills | Status: AC | PRN

## 2020-01-06 NOTE — Progress Notes (Signed)
Pt d/c home via private vehicle.  D/c paperwork reviewed with pt and husband and they expressed understanding hard scripts given at this time as well.  All belongings taken at time of discharge.  NAD noted.  IV removed by Caren Griffins NT+3 without issue.  Polar care taken at time of d/c and immobilizer in place.  Pt wheeled to personal vehicle and able to get in on her own.

## 2020-01-06 NOTE — Discharge Summary (Signed)
Physician Discharge Summary  Subjective: 1 Day Post-Op Procedure(s) (LRB): Right total shoulder arthroplasty, biceps tenodesis (Right) Patient reports pain as moderate.   Patient seen in rounds with Dr. Posey Pronto. Patient is well, and has had no acute complaints or problems Patient is ready to go home after physical therapy  Physician Discharge Summary  Patient ID: Bailey Flores MRN: [id number] DOB/AGE: Nov 16, 1960 59 y.o.  Admit date: 01/05/2020 Discharge date: 01/06/2020  Admission Diagnoses:  Discharge Diagnoses:  Active Problems:   Shoulder arthritis   Discharged Condition: fair  Hospital Course: Patient is postop day 1 from a right total shoulder replacement.  She is working on her pain management.  Her nerve block is worn off and her pain has increased.  She is ready to go home.  She is already done some physical therapy around the room.  Treatments: surgery:  1. Right total shoulder arthroplasty 2. Right biceps tenodesis  SURGEON: Cato Mulligan, MD  ASSISTANTS: Reche Dixon, PA; Gaynelle Arabian, PA-S  ANESTHESIA: Gen + Exparel interscalene block  ESTIMATED BLOOD LOSS:100cc  TOTAL IV FLUIDS: per anesthesia  IMPLANTS: Tornier Aequalis:  - Perform Cortiloc Pegged Glenoid S30  - Ascend Flex Humeral Stem - Size 3C - Humeral Head 46 x 50mm High Ecc head  Discharge Exam: Blood pressure 118/73, pulse 78, temperature 98.3 F (36.8 C), temperature source Oral, resp. rate 16, height 4\' 9"  (1.448 m), weight 50.8 kg, SpO2 97 %.   Disposition:    Allergies as of 01/06/2020   No Known Allergies     Medication List    STOP taking these medications   aspirin 81 MG chewable tablet Replaced by: aspirin 325 MG EC tablet     TAKE these medications   acetaminophen 650 MG CR tablet Commonly known as: TYLENOL Take 1,950 mg by mouth every 8 (eight) hours.   albuterol 108 (90 Base) MCG/ACT inhaler Commonly known as: VENTOLIN HFA Inhale 1-2 puffs into the  lungs every 4 (four) hours as needed for wheezing or shortness of breath.   aspirin 325 MG EC tablet Take 1 tablet (325 mg total) by mouth daily. Replaces: aspirin 81 MG chewable tablet   azithromycin 500 MG tablet Commonly known as: ZITHROMAX Take 500 mg by mouth 3 (three) times a week.   buPROPion 200 MG 12 hr tablet Commonly known as: WELLBUTRIN SR TAKE 1 TABLET TWICE A DAY   celecoxib 200 MG capsule Commonly known as: CELEBREX Take 400 mg by mouth 2 (two) times daily.   docusate sodium 100 MG capsule Commonly known as: COLACE Take 200 mg by mouth daily.   ethambutol 400 MG tablet Commonly known as: MYAMBUTOL Take 1,400 mg by mouth 3 (three) times a week.   gabapentin 300 MG capsule Commonly known as: NEURONTIN Take 600 mg by mouth 3 (three) times daily.   ipratropium-albuterol 0.5-2.5 (3) MG/3ML Soln Commonly known as: DUONEB Inhale 3 mLs into the lungs every 4 (four) hours as needed (Only uses with cold symptoms).   lamoTRIgine 200 MG tablet Commonly known as: LAMICTAL TAKE 1 TABLET TWICE A DAY (DOSE INCREASE) What changed: See the new instructions.   lisdexamfetamine 20 MG capsule Commonly known as: VYVANSE Take 1 capsule (20 mg total) by mouth daily.   methocarbamol 500 MG tablet Commonly known as: ROBAXIN Take 2 tablets (1,000 mg total) by mouth every 6 (six) hours. What changed: when to take this   Milk Thistle 150 MG Caps Take 300 mg by mouth daily.   montelukast 10  MG tablet Commonly known as: SINGULAIR Take 10 mg by mouth at bedtime.   omeprazole 40 MG capsule Commonly known as: PRILOSEC Take 40 mg by mouth daily.   oxyCODONE 5 MG immediate release tablet Commonly known as: Oxy IR/ROXICODONE Take 1-2 tablets (5-10 mg total) by mouth every 4 (four) hours as needed for moderate pain (pain score 4-6). What changed:   how much to take  when to take this  reasons to take this   propranolol 10 MG tablet Commonly known as: INDERAL TAKE 1  TABLET(10 MG) BY MOUTH THREE TIMES DAILY AS NEEDED FOR SEVERE ANXIETY OR AGITATION What changed: See the new instructions.   rifampin 300 MG capsule Commonly known as: RIFADIN Take 600 mg by mouth 3 (three) times a week.   Trelegy Ellipta 100-62.5-25 MCG/INH Aepb Generic drug: Fluticasone-Umeclidin-Vilant Inhale 1 puff into the lungs daily.   valACYclovir 1000 MG tablet Commonly known as: VALTREX Take 2,000 mg by mouth 2 (two) times daily as needed (herpes simplex).   valsartan-hydrochlorothiazide 160-12.5 MG tablet Commonly known as: DIOVAN-HCT Take 1 tablet by mouth at bedtime.   Vitamin D3 25 MCG (1000 UT) Caps Take 1,000 Units by mouth at bedtime.       Follow-up Information    Leim Fabry, MD. Go in 2 week(s).   Specialty: Orthopedic Surgery Contact information: [place] [address] [phone]               Signed: Prescott Parma, Sharde Gover 01/06/2020, 7:32 AM   Objective: Vital signs in last 24 hours: Temp:  [97.5 F (36.4 C)-99.5 F (37.5 C)] 98.3 F (36.8 C) (08/17 0428) Pulse Rate:  [73-99] 78 (08/17 0428) Resp:  [0-22] 16 (08/17 0428) BP: (102-133)/(53-98) 118/73 (08/17 0428) SpO2:  [95 %-100 %] 97 % (08/17 0428)  Intake/Output from previous day:  Intake/Output Summary (Last 24 hours) at 01/06/2020 0732 Last data filed at 01/06/2020 0413 Gross per 24 hour  Intake 2524.83 ml  Output 10 ml  Net 2514.83 ml    Intake/Output this shift: No intake/output data recorded.  Labs: Recent Labs    01/06/20 0408  HGB 10.5*   Recent Labs    01/06/20 0408  WBC 7.6  RBC 3.40*  HCT 32.0*  PLT 264   Recent Labs    01/06/20 0408  NA 140  K 3.7  CL 105  CO2 28  BUN 12  CREATININE 0.49  GLUCOSE 105*  CALCIUM 8.4*   No results for input(s): LABPT, INR in the last 72 hours.  EXAM: General - Patient is Alert and Oriented Extremity - Neurovascular intact Sensation intact distally Compartment soft Incision - clean, dry, with  the Hemovac removed Motor Function -grip strength intact with dorsiflexion volar flexion of the fingers.  Assessment/Plan: 1 Day Post-Op Procedure(s) (LRB): Right total shoulder arthroplasty, biceps tenodesis (Right) Procedure(s) (LRB): Right total shoulder arthroplasty, biceps tenodesis (Right) Past Medical History:  Diagnosis Date  . Arthritis    hands  . Asthma   . Barrett's esophagus   . Cancer [facility])    neuroendocrine ca  . COPD (chronic obstructive pulmonary disease) ([place])   . Depression   . GERD (gastroesophageal reflux disease)   . Hypertension   . Motion sickness    reading in car  . Neuroendocrine carcinoma ([place]) 01/16/2017  . Orthodontics    permanent retainers, top and bottom  . Pneumonia 2017, 2020   Active Problems:   Shoulder arthritis  Estimated body mass index is 24.24 kg/m  as calculated from the following:   Height as of this encounter: 4\' 9"  (1.448 m).   Weight as of this encounter: 50.8 kg. Advance diet Up with therapy D/C IV fluids Discharge home with home health Diet - Regular diet Follow up - in 2 weeks Activity -shoulder immobilizer at all times on right shoulder. Disposition - Home Condition Upon Discharge - Stable DVT Prophylaxis - Aspirin  Reche Dixon, PA-C Orthopaedic Surgery 01/06/2020, 7:32 AM

## 2020-01-06 NOTE — Progress Notes (Signed)
Physical Therapy Treatment Patient Details Name: Bailey Flores MRN: [id number] DOB: 11-15-1960 Today's Date: 01/06/2020    History of Present Illness Pt is a 59 y.o. female who presents w/ R shoulder OA and is now s/p R total shoulder arthroplasty and R biceps tenodesis. Pt's PMH includes: asthma, sleep apnea, pneunomia, COPD, HTN, PVD, depression, bipolar disorder, GERD, arthritis, neuroendocrine cancer, and barrett's esophagus.    PT Comments    Pt was pleasant and motivated to participate during the session. Pt demonstrated full sensation to light touch as well as AROM of RUE digits this session. Pt demonstrated mod-I w/ scooting in chair as well as w/ sit-to-stand transfer. Pt able to ambulate 100 Feet, then 200 Feet, w/o AD and w/ supervision; generally slow, cautious, gait w/ small bilateral step lengths and re-education to be aware of RUE positioning w/ mobility to avoid obstacles. Moderate left/right drift noted throughout ambulation; but generally steady w/ no LOB. Ambulation briefly attempted w/ SPC, but pt unable to appropriately manage SPC due to IV line in LUE. Pt encouraged to re-attempt ambulation w/ SPC w/ HHPT at a later time. Pt able to ascend/descend 4x stairs x2 w/ use of L rail in both directions. Pt appeared smooth and steady using a step-through pattern w/ ascending stairs. Pt had some difficulty w/ descending stairs 2/2 visual impairment due to sling as well as decreased ability to look down 2/2 previous spinal fusion. Pt descended stairs w/ a step-to pattern and was generally cautious and felt for edge of step with foot before stepping down. Pt returned to recliner and given education on sequencing for car transfers. Pt will benefit from HHPT services upon discharge to safely address deficits listed in patient problem list for decreased caregiver assistance and eventual return to PLOF.   Follow Up Recommendations  Home health PT     Equipment Recommendations  None  recommended by PT    Recommendations for Other Services       Precautions / Restrictions Precautions Precautions: Shoulder;Fall Type of Shoulder Precautions: NWB RUE Shoulder Interventions: Shoulder sling/immobilizer;Off for dressing/bathing/exercises;Shoulder abduction pillow Precaution Booklet Issued: No Precaution Comments: NWB on operative extremity. Please instruct on pendulums, passive RoM exercises (For anatomic TSA: 140 FF and 40 ER). No Elbow Flexion ROM. Required Braces or Orthoses: Sling Restrictions Weight Bearing Restrictions: Yes RUE Weight Bearing: Non weight bearing    Mobility  Bed Mobility               General bed mobility comments: Pt received and left up in chair, INDEPENDENT scooting forward/backward in chair   Transfers Overall transfer level: Modified independent Transfers: Sit to/from Stand Sit to Stand: Modified independent (Device/Increase time)           Ambulation/Gait Ambulation/Gait assistance: Supervision Gait Distance (Feet): 200 Feet; 100 Feet Assistive device: None Gait Pattern/deviations: Step-through pattern;Decreased step length - right;Decreased step length - left;Shuffle;Drifts right/left Gait velocity: decreased   General Gait Details: Pt walked w/ small bilateral step lengths, generally slow likely as a precaution to watch for RUE positioning. Moderate left/right drifting throughout ambulation but generally steady w/ no LOB. Ambulation attempted w/ SPC but pt unable to bend wrist due to IV; encouraged to try [facility] w/ HHPT   Stairs Stairs: Yes Stairs assistance: Supervision Stair Management: One rail Left;Forwards;Alternating pattern;Step to pattern Number of Stairs: 4 General stair comments: Pt ascended/descended 4 stairs x2. Very smooth ascending w/ step-through pattern and use of L rail. Pt unable to clearly see stairs w/ descent  due to spinal fusion and visual impairment w/ sling; pt descended cautiously w/ use of L rail  and used foot to feel for steps before stepping down w/ a step-to pattern. Pt has bilateral rails at home.   Wheelchair Mobility    Modified Rankin (Stroke Patients Only)       Balance Overall balance assessment: Needs assistance Sitting-balance support: Feet unsupported;No upper extremity supported Sitting balance-Leahy Scale: Good     Standing balance support: During functional activity;No upper extremity supported;Single extremity supported Standing balance-Leahy Scale: Good Standing balance comment: Appeared steady in standing, good ability to shift weight through BLE including turns and backwards steps w/ no use of AD                            Cognition Arousal/Alertness: Awake/alert Behavior During Therapy: WFL for tasks assessed/performed Overall Cognitive Status: Within Functional Limits for tasks assessed                                 General Comments: Pt intermittently emotional during session      Exercises  Other Exercises: Pt given education on sequencing for car transfers    General Comments        Pertinent Vitals/Pain Pain Assessment: Faces Faces Pain Scale: Hurts little more Pain Location: R shoulder Pain Descriptors / Indicators: Aching;Grimacing;Sore;Operative site guarding Pain Intervention(s): Limited activity within patient's tolerance;Monitored during session;Repositioned    Home Living Family/patient expects to be discharged to:: Private residence Living Arrangements: Spouse/significant other Available Help at Discharge: Family;Available 24 hours/day Type of Home: House Home Access: Stairs to enter Entrance Stairs-Rails: Can reach both Home Layout: Multi-level Home Equipment: Walker - 4 wheels;Cane - quad;Cane - single point;Adaptive equipment Additional Comments: Pt recently moved to split level home    Prior Function Level of Independence: Needs assistance  Gait / Transfers Assistance Needed: Independent  w/o AD community distances, no fall history ADL's / Homemaking Assistance Needed: Spouse assists as needed with bathing, dressing, and grooming and reaching cabinets     PT Goals (current goals can now be found in the care plan section) Acute Rehab PT Goals Patient Stated Goal: Use shoulder more with less pain Progress towards PT goals: Progressing toward goals    Frequency    BID      PT Plan Current plan remains appropriate    Co-evaluation              AM-PAC PT "6 Clicks" Mobility   Outcome Measure  Help needed turning from your back to your side while in a flat bed without using bedrails?: A Little Help needed moving from lying on your back to sitting on the side of a flat bed without using bedrails?: A Little Help needed moving to and from a bed to a chair (including a wheelchair)?: A Little Help needed standing up from a chair using your arms (e.g., wheelchair or bedside chair)?: A Little Help needed to walk in hospital room?: A Little Help needed climbing 3-5 steps with a railing? : A Little 6 Click Score: 18    End of Session Equipment Utilized During Treatment: Gait belt Activity Tolerance: Patient tolerated treatment well Patient left: in chair;with call bell/phone within reach;with chair alarm set Nurse Communication: Mobility status;Weight bearing status;Precautions PT Visit Diagnosis: Muscle weakness (generalized) (M62.81);Pain;Difficulty in walking, not elsewhere classified (R26.2) Pain - Right/Left: Right Pain -  part of body: Shoulder     Time: 3662-9476 PT Time Calculation (min) (ACUTE ONLY): 28 min  Charges:                        Angeleen Horney SPT 01/06/20, 11:26 AM

## 2020-01-06 NOTE — Evaluation (Signed)
Occupational Therapy Evaluation Patient Details Name: Bailey Flores MRN: [id number] DOB: June 08, 1960 Today's Date: 01/06/2020    History of Present Illness Pt is a 59 y.o. female who presents w/ R shoulder OA and is now s/p R total shoulder arthroplasty and R biceps tenodesis. Pt's PMH includes: asthma, sleep apnea, pneunomia, COPD, HTN, PVD, depression, bipolar disorder, GERD, arthritis, neuroendocrine cancer, and barrett's esophagus.   Clinical Impression   Bailey Flores was seen for OT evaluation this date. Pt lives c husband in split level home. Prior to surgery, pt was independent c mobility and requiring assist for ADLs from husband. Pt has orders for RUE to be immobilized and will be NWBing per MD. Patient presents with impaired strength/ROM, pain, and sensation to RUE with block not completely resolved yet. These impairments result in a decreased ability to perform self care tasks requiring MOD A for UB/LB dressing and bathing and MAX A for application of polar care, compression stockings, and sling/immobilizer. Pt instructed in polar care mgt, compression stockings mgt, sling/immobilizer mgt, functional application of RUE precautions, adaptive strategies for bathing/dressing/toileting/grooming, positioning and considerations for sleep, and home/routines modifications to maximize falls prevention, safety, and independence. Handout provided. OT adjusted sling/immobilizer and polar care to improve comfort, optimize positioning, and to maximize skin integrity/safety. Pt verbalized understanding of all education/training provided. Pt will benefit from skilled OT services to address these limitations and improve independence in daily tasks. Recommend HHOT services to continue therapy to maximize return to PLOF, address home/routines modifications and safety, minimize falls risk, and minimize caregiver burden.       Follow Up Recommendations  Follow surgeon's recommendation for DC plan and  follow-up therapies    Equipment Recommendations  None recommended by OT    Recommendations for Other Services       Precautions / Restrictions Precautions Precautions: Shoulder;Fall Type of Shoulder Precautions: NWB RUE Shoulder Interventions: Shoulder sling/immobilizer;Off for dressing/bathing/exercises;Shoulder abduction pillow Precaution Comments: NWB on operative extremity. Please instruct on pendulums, passive RoM exercises (For anatomic TSA: 140 FF and 40 ER). No Elbow Flexion ROM. Required Braces or Orthoses: Sling Restrictions Weight Bearing Restrictions: Yes RUE Weight Bearing: Non weight bearing      Mobility Bed Mobility   General bed mobility comments: Pt received and left up in chair, INDEPENDENT scooting forward/backward in chair   Transfers Overall transfer level: Needs assistance Equipment used: 1 person hand held assist Transfers: Sit to/from Stand Sit to Stand: Min guard         General transfer comment: HHA sit<>stand at chair and commode     Balance Overall balance assessment: Needs assistance Sitting-balance support: Feet unsupported;No upper extremity supported Sitting balance-Leahy Scale: Good     Standing balance support: Single extremity supported;During functional activity Standing balance-Leahy Scale: Good      ADL either performed or assessed with clinical judgement   ADL Overall ADL's : Needs assistance/impaired     General ADL Comments: SUP toileting, MAX A don/doff sling seated EOC. MOD A don shirt seated EOC - assist for RUE. SUPERVISION adjust B socks seated EOC, MOD A don pants and underwear seated and standing. MAX A washing under RUE and deodorant application in stting. TOTAL A for perihygiene in standing     Vision Baseline Vision/History: Wears glasses Wears Glasses: At all times        Pertinent Vitals/Pain Pain Assessment: Faces Faces Pain Scale: Hurts even more Pain Location: R shoulder Pain Descriptors /  Indicators: Aching;Grimacing;Sore;Operative site guarding Pain Intervention(s): Limited activity  within patient's tolerance;Monitored during session;Premedicated before session;Repositioned     Hand Dominance Right   Extremity/Trunk Assessment Upper Extremity Assessment Upper Extremity Assessment: RUE deficits/detail RUE Deficits / Details: active wrist/finger movement - pain c mobility RUE: Unable to fully assess due to pain;Unable to fully assess due to immobilization RUE Coordination: WNL   Lower Extremity Assessment Lower Extremity Assessment: Generalized weakness   Cervical / Trunk Assessment Cervical / Trunk Assessment: Normal   Communication Communication Communication: No difficulties   Cognition Arousal/Alertness: Awake/alert Behavior During Therapy: WFL for tasks assessed/performed Overall Cognitive Status: Within Functional Limits for tasks assessed         General Comments: Pt intermittently emotional during session   General Comments       Exercises Exercises: Other exercises Other Exercises Other Exercises: Pt educated re: OT role, DME recs, d/c recs, sling mgmt, polar care mgmt, adapted dressing techniques, home/routines modifications Other Exercises: LBD, UBD, don/doff sling, sit<>stand, toileting at commode, perihygiene, sitting/standing balance/tolerance   Shoulder Instructions      Home Living Family/patient expects to be discharged to:: Private residence Living Arrangements: Spouse/significant other Available Help at Discharge: Family;Available 24 hours/day Type of Home: House Home Access: Stairs to enter [employer] of Steps: 1 Entrance Stairs-Rails: Can reach both Home Layout: Multi-level Alternate Level Stairs-Number of Steps: 8 stairs to get to main level w/ bed and bath   Bathroom Shower/Tub: Occupational psychologist: Standard Bathroom Accessibility: Yes   Home Equipment: Environmental consultant - 4 wheels;Cane - quad;Cane - single  point;Adaptive equipment Adaptive Equipment: Reacher;Long-handled shoe horn Additional Comments: Pt recently moved to split level home      Prior Functioning/Environment Level of Independence: Needs assistance  Gait / Transfers Assistance Needed: Independent w/o AD community distances, no fall history ADL's / Homemaking Assistance Needed: Spouse assists as needed with bathing, dressing, and grooming and reaching cabinets            OT Problem List: Decreased strength;Decreased range of motion;Impaired balance (sitting and/or standing);Decreased coordination;Decreased safety awareness;Impaired UE functional use      OT Treatment/Interventions: Self-care/ADL training;Therapeutic exercise;Energy conservation;DME and/or AE instruction;Therapeutic activities;Patient/family education;Balance training    OT Goals(Current goals can be found in the care plan section) Acute Rehab OT Goals Patient Stated Goal: Use shoulder more with less pain OT Goal Formulation: With patient Time For Goal Achievement: 01/20/20 Potential to Achieve Goals: Good ADL Goals Pt Will Perform Grooming: with set-up;standing (c LRAD PRN) Pt Will Perform Upper Body Dressing: with min assist;with caregiver independent in assisting;sitting Pt Will Perform Lower Body Dressing: with modified independence;sit to/from stand Pt Will Transfer to Toilet: with modified independence;ambulating;regular height toilet (c LRAD PRN)  OT Frequency: Min 1X/week   Barriers to D/C: Inaccessible home environment          Co-evaluation              AM-PAC OT "6 Clicks" Daily Activity     Outcome Measure Help from another person eating meals?: A Little Help from another person taking care of personal grooming?: A Lot Help from another person toileting, which includes using toliet, bedpan, or urinal?: A Lot Help from another person bathing (including washing, rinsing, drying)?: A Lot Help from another person to put on and  taking off regular upper body clothing?: A Lot Help from another person to put on and taking off regular lower body clothing?: A Lot 6 Click Score: 13   End of Session Equipment Utilized During Treatment: Other (comment) (Sling immobilizer)  Activity Tolerance: Patient tolerated treatment well Patient left: in chair;Other (comment) (PT in room at end of session)  OT Visit Diagnosis: Unsteadiness on feet (R26.81);Other abnormalities of gait and mobility (R26.89)                Time: 0141-5973 OT Time Calculation (min): 34 min Charges:  OT General Charges $OT Visit: 1 Visit OT Evaluation $OT Eval Moderate Complexity: 1 Mod OT Treatments $Self Care/Home Management : 23-37 mins  Dessie Coma, M.S. OTR/L  01/06/20, 10:50 AM  ascom [phone]

## 2020-01-06 NOTE — Progress Notes (Signed)
  Subjective: 1 Day Post-Op Procedure(s) (LRB): Right total shoulder arthroplasty, biceps tenodesis (Right) Patient reports pain as moderate.   Patient is well, and has had no acute complaints or problems Plan is to go Home after hospital stay. Negative for chest pain and shortness of breath Fever: no Gastrointestinal: Negative for nausea and vomiting  Objective: Vital signs in last 24 hours: Temp:  [97.5 F (36.4 C)-99.5 F (37.5 C)] 98.3 F (36.8 C) (08/17 0428) Pulse Rate:  [73-99] 78 (08/17 0428) Resp:  [0-22] 16 (08/17 0428) BP: (102-133)/(53-98) 118/73 (08/17 0428) SpO2:  [94 %-100 %] 97 % (08/17 0428)  Intake/Output from previous day:  Intake/Output Summary (Last 24 hours) at 01/06/2020 0714 Last data filed at 01/06/2020 0413 Gross per 24 hour  Intake 2524.83 ml  Output 10 ml  Net 2514.83 ml    Intake/Output this shift: No intake/output data recorded.  Labs: Recent Labs    01/06/20 0408  HGB 10.5*   Recent Labs    01/06/20 0408  WBC 7.6  RBC 3.40*  HCT 32.0*  PLT 264   Recent Labs    01/06/20 0408  NA 140  K 3.7  CL 105  CO2 28  BUN 12  CREATININE 0.49  GLUCOSE 105*  CALCIUM 8.4*   No results for input(s): LABPT, INR in the last 72 hours.   EXAM General - Patient is Alert and Oriented Extremity - Neurovascular intact Sensation intact distally Dorsiflexion/Plantar flexion intact Compartment soft Dressing/Incision - clean, dry, with the Hemovac removed. Motor Function - intact, moving fingers and wrist well on exam.   Past Medical History:  Diagnosis Date  . Arthritis    hands  . Asthma   . Barrett's esophagus   . Cancer [facility])    neuroendocrine ca  . COPD (chronic obstructive pulmonary disease) ([place])   . Depression   . GERD (gastroesophageal reflux disease)   . Hypertension   . Motion sickness    reading in car  . Neuroendocrine carcinoma ([place]) 01/16/2017  . Orthodontics    permanent retainers, top and bottom  . Pneumonia 2017,  2020    Assessment/Plan: 1 Day Post-Op Procedure(s) (LRB): Right total shoulder arthroplasty, biceps tenodesis (Right) Active Problems:   Shoulder arthritis  Estimated body mass index is 24.24 kg/m as calculated from the following:   Height as of this encounter: 4\' 9"  (1.448 m).   Weight as of this encounter: 50.8 kg. Advance diet Up with therapy D/C IV fluids Discharge home with home health  DVT Prophylaxis - Aspirin Shoulder immobilizer to right arm.  Reche Dixon, PA-C Orthopaedic Surgery 01/06/2020, 7:14 AM

## 2020-01-06 NOTE — TOC Initial Note (Signed)
Transition of Care [facility]) - Initial/Assessment Note    Patient Details  Name: Bailey Flores MRN: [id number] Date of Birth: 02-07-1961  Transition of [facility] CM/SW Contact:    Shelbie Ammons, RN Phone Number: 01/06/2020, 11:04 AM  Clinical Narrative:      RNCM assessed patient at bedside. Patient reports to feeling better today and is happy to be going home. She reports that she has just recently moved to [place]. She is agreeable to home health services and thinks she may have had services in the past from [place]. Patient is however agreeable to whichever agency can service her in that area. RNCM reached out to [place] with [facility] and she will see if she is able to service patient.              Expected Discharge Plan: [place] Barriers to Discharge: No Barriers Identified   Patient Goals and CMS Choice        Expected Discharge Plan and Services Expected Discharge Plan: [place] Acute Care Choice: [place] arrangements for the past 2 months: Single Family Home Expected Discharge Date: 01/06/20                                    Prior Living Arrangements/Services Living arrangements for the past 2 months: Single Family Home Lives with:: Spouse Patient language and need for interpreter reviewed:: Yes Do you feel safe going back to the place where you live?: Yes      Need for Family Participation in Patient Care: Yes (Comment) Care giver support system in place?: Yes (comment)   Criminal Activity/Legal Involvement Pertinent to Current Situation/Hospitalization: No - Comment as needed  Activities of Daily Living Home Assistive Devices/Equipment: None ADL Screening (condition at time of admission) Patient's cognitive ability adequate to safely complete daily activities?: Yes Is the patient deaf or have difficulty hearing?: Yes Does the patient have difficulty seeing, even when wearing  glasses/contacts?: Yes Does the patient have difficulty concentrating, remembering, or making decisions?: Yes Patient able to express need for assistance with ADLs?: Yes Does the patient have difficulty dressing or bathing?: No (husband assist at home due to limitation on right shoulder.) Independently performs ADLs?: Yes (appropriate for developmental age) Does the patient have difficulty walking or climbing stairs?: No Weakness of Legs: None Weakness of Arms/Hands: Right (Right shoulder)  Permission Sought/Granted                  Emotional Assessment Appearance:: Appears stated age     Orientation: : Oriented to Self, Oriented to Place, Oriented to  Time, Oriented to Situation Alcohol / Substance Use: Not Applicable Psych Involvement: No (comment)  Admission diagnosis:  Shoulder arthritis [M19.019] Patient Active Problem List   Diagnosis Date Noted  . Shoulder arthritis 01/05/2020  . Bipolar disorder, in partial remission, most recent episode depressed ([place]) 12/15/2019  . Encounter for screening colonoscopy   . Memory loss of unknown cause 10/15/2019  . Bipolar 1 disorder, depressed, full remission ([place]) 04/24/2019  . Coronary artery calcification 04/21/2019  . S/P cervical spinal fusion 04/09/2019  . Gastritis with intestinal metaplasia of stomach   . Pneumonia 03/07/2019  . Bipolar disorder, current episode depressed, mild ([place]) 01/03/2019  . Attention deficit hyperactivity disorder (ADHD), predominantly inattentive type 01/03/2019  . 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 Sepsis ([place]) 12/10/2017  . Lumbar radiculopathy 12/05/2017  . Goals of care, counseling/discussion 07/17/2017  . Arthritis of right glenohumeral joint 06/13/2017  . Mild cognitive impairment 04/26/2017  . Intestinal metaplasia of gastric mucosa   . Gastritis without bleeding   . Barrett's esophagus without  dysplasia   . Heartburn   . Loss of memory 01/18/2017  . Tremor 01/18/2017  . Attention deficit hyperactivity disorder (ADHD), combined type 01/16/2017  . Bipolar 1 disorder ([place]) 01/16/2017  . Chronic abdominal pain 01/16/2017  . COPD, moderate ([place]) 01/16/2017  . Essential hypertension 01/16/2017  . Gastroesophageal reflux disease without esophagitis 01/16/2017  . Herpes simplex 01/16/2017  . Memory problem 01/16/2017   PCP:  [name], MD Pharmacy:   [facility] [phone], [place] [address] Phone: [phone] Fax: [phone]  [facility] [place], [place] [address] [place] [address] Phone: [phone] Fax: [place], [place] [address] Phone: [phone] Fax: [phone]     Social Determinants of Health (SDOH) Interventions    Readmission Risk Interventions No flowsheet data found.

## 2020-01-06 NOTE — Progress Notes (Signed)
Occupational Therapy Treatment Patient Details Name: Bailey Flores MRN: [id number] DOB: Oct 25, 1960 Today's Date: 01/06/2020    History of present illness Pt is a 59 y.o. female who presents w/ R shoulder OA and is now s/p R total shoulder arthroplasty and R biceps tenodesis. Pt's PMH includes: asthma, sleep apnea, pneunomia, COPD, HTN, PVD, depression, bipolar disorder, GERD, arthritis, neuroendocrine cancer, and barrett's esophagus.   OT comments  Ms Salmon was seen for OT treatment on this date. Pt found resting in recliner "sleepy from pain medication" and husband present for education session. Husband educated on sling immobilizer mgmt, polar care mgmt, falls prevention, home/routines modifications, and pain mgmt techniques. Caregiver verbalized understanding of instruction provided. Pt making good progress toward goals. Pt continues to benefit from skilled OT services to maximize return to PLOF and minimize risk of future falls, injury, caregiver burden, and readmission. Will continue to follow POC. Discharge recommendation remains appropriate.    Follow Up Recommendations  Follow surgeons recommendation for DC plan and follow-up therapies    Equipment Recommendations  None recommended by OT    Recommendations for Other Services      Precautions / Restrictions Precautions Precautions: Shoulder;Fall Type of Shoulder Precautions: NWB RUE Shoulder Interventions: Shoulder sling/immobilizer;Off for dressing/bathing/exercises;Shoulder abduction pillow Precaution Booklet Issued: No Precaution Comments: NWB on operative extremity. Please instruct on pendulums, passive RoM exercises (For anatomic TSA: 140 FF and 40 ER). No Elbow Flexion ROM. Required Braces or Orthoses: Sling Restrictions Weight Bearing Restrictions: Yes RUE Weight Bearing: Non weight bearing       Mobility Bed Mobility   General bed mobility comments: Pt received and left up in chair  Transfers Overall  transfer level: Modified independent Equipment used: 1 person hand held assist Transfers: Sit to/from Stand Sit to Stand: Modified independent (Device/Increase time)   General transfer comment: Deferred    Balance Overall balance assessment: Needs assistance Sitting-balance support: Feet unsupported;No upper extremity supported Sitting balance-Leahy Scale: Good     Standing balance support: During functional activity;No upper extremity supported;Single extremity supported Standing balance-Leahy Scale: Good Standing balance comment: Appeared steady in standing, good ability to shift weight through BLE including turns and backwards steps w/ no use of AD      ADL either performed or assessed with clinical judgement   ADL Overall ADL's : Needs assistance/impaired     General ADL Comments: Caregiver education re: UB ADLs, sling mgmt, polar care mgmt     Vision Baseline Vision/History: Wears glasses Wears Glasses: At all times      Cognition Arousal/Alertness: Awake/alert Behavior During Therapy: WFL for tasks assessed/performed Overall Cognitive Status: Within Functional Limits for tasks assessed         General Comments: Pt intermittently emotional during session        Exercises Exercises: Other exercises Other Exercises Other Exercises: Pt and husband educated re: DME recs, d/c recs, sling mgmt, polar care mgmt, compression stocking mgmt, routines modifications Other Exercises: LBD, UBD, don/doff sling, sit<>stand, toileting at commode, perihygiene, sitting/standing balance/tolerance Other Exercises: Pt given education on sequencing for car transfers     Pertinent Vitals/ Pain       Pain Assessment: No/denies pain Faces Pain Scale: Hurts little more Pain Location: R shoulder Pain Descriptors / Indicators: Aching;Grimacing;Sore;Operative site guarding Pain Intervention(s): Limited activity within patient's tolerance;Monitored during session;Repositioned  Home  Living Family/patient expects to be discharged to:: Private residence Living Arrangements: Spouse/significant other Available Help at Discharge: Family;Available 24 hours/day Type of Home: House Home Access: Stairs  to enter Entrance Stairs-Number of Steps: 1 Entrance Stairs-Rails: Can reach both Home Layout: Multi-level Alternate Level Stairs-Number of Steps: 8 stairs to get to main level w/ bed and bath   Bathroom Shower/Tub: Occupational psychologist: Standard Bathroom Accessibility: Yes   Home Equipment: Environmental consultant - 4 wheels;Cane - quad;Cane - single point;Adaptive equipment Adaptive Equipment: Reacher;Long-handled shoe horn Additional Comments: Pt recently moved to split level home      Prior Functioning/Environment Level of Independence: Needs assistance  Gait / Transfers Assistance Needed: Independent w/o AD community distances, no fall history ADL's / Homemaking Assistance Needed: Spouse assists as needed with bathing, dressing, and grooming and reaching cabinets       Frequency  Min 1X/week        Progress Toward Goals  OT Goals(current goals can now be found in the care plan section)  Progress towards OT goals: Progressing toward goals  Acute Rehab OT Goals Patient Stated Goal: Use shoulder more with less pain OT Goal Formulation: With patient Time For Goal Achievement: 01/20/20 Potential to Achieve Goals: Good ADL Goals Pt Will Perform Grooming: with set-up;standing (c LRAD PRN) Pt Will Perform Upper Body Dressing: with min assist;with caregiver independent in assisting;sitting Pt Will Perform Lower Body Dressing: with modified independence;sit to/from stand Pt Will Transfer to Toilet: with modified independence;ambulating;regular height toilet (c LRAD PRN)  Plan Discharge plan remains appropriate;Frequency remains appropriate    Co-evaluation                 AM-PAC OT "6 Clicks" Daily Activity     Outcome Measure   Help from another  person eating meals?: A Little Help from another person taking care of personal grooming?: A Lot Help from another person toileting, which includes using toliet, bedpan, or urinal?: A Lot Help from another person bathing (including washing, rinsing, drying)?: A Lot Help from another person to put on and taking off regular upper body clothing?: A Lot Help from another person to put on and taking off regular lower body clothing?: A Lot 6 Click Score: 13    End of Session Equipment Utilized During Treatment: Other (comment) (sling immobilizer)  OT Visit Diagnosis: Unsteadiness on feet (R26.81);Other abnormalities of gait and mobility (R26.89)   Activity Tolerance Patient tolerated treatment well   Patient Left in chair;with family/visitor present   Nurse Communication          Time: 2482-5003 OT Time Calculation (min): 23 min  Charges: OT General Charges $OT Visit: 1 Visit OT Evaluation $OT Eval Moderate Complexity: 1 Mod OT Treatments $Self Care/Home Management : 23-37 mins  Dessie Coma, M.S. OTR/L  01/06/20, 1:07 PM  ascom [phone]

## 2020-01-07 DIAGNOSIS — A310 Pulmonary mycobacterial infection: Secondary | ICD-10-CM | POA: Insufficient documentation

## 2020-01-07 DIAGNOSIS — Z96611 Presence of right artificial shoulder joint: Secondary | ICD-10-CM | POA: Insufficient documentation

## 2020-01-07 LAB — SURGICAL PATHOLOGY

## 2020-01-08 DIAGNOSIS — E876 Hypokalemia: Secondary | ICD-10-CM | POA: Insufficient documentation

## 2020-01-12 ENCOUNTER — Telehealth (INDEPENDENT_AMBULATORY_CARE_PROVIDER_SITE_OTHER): Admitting: Psychiatry

## 2020-01-12 ENCOUNTER — Other Ambulatory Visit: Payer: Self-pay

## 2020-01-12 ENCOUNTER — Encounter: Payer: Self-pay | Admitting: Psychiatry

## 2020-01-12 DIAGNOSIS — R413 Other amnesia: Secondary | ICD-10-CM

## 2020-01-12 DIAGNOSIS — F3176 Bipolar disorder, in full remission, most recent episode depressed: Secondary | ICD-10-CM | POA: Diagnosis not present

## 2020-01-12 DIAGNOSIS — Z8659 Personal history of other mental and behavioral disorders: Secondary | ICD-10-CM | POA: Diagnosis not present

## 2020-01-12 NOTE — Progress Notes (Signed)
Provider Location : [facility] Patient Location : Home  Participants: Patient , Provider  Virtual Visit via Video Note  I connected with Bailey Flores on 01/12/20 at  1:20 PM EDT by a video enabled telemedicine application and verified that I am speaking with the correct person using two identifiers.   I discussed the limitations of evaluation and management by telemedicine and the availability of in person appointments. The patient expressed understanding and agreed to proceed.   I discussed the assessment and treatment plan with the patient. The patient was provided an opportunity to ask questions and all were answered. The patient agreed with the plan and demonstrated an understanding of the instructions.   The patient was advised to call back or seek an in-person evaluation if the symptoms worsen or if the condition fails to improve as anticipated.  Video connection was lost at more than 50% of the duration of the visit, at which time the remainder of the visit was completed through audio only   MD OP Progress Note  01/12/2020 3:49 PM Laxmi Choung  MRN:  [id number]  Chief Complaint:  Chief Complaint    Follow-up     HPI: Bailey Flores is a 59 year old female, lives in [place], is married, has a history of bipolar disorder, ADHD, cognitive disorder, COPD, hypertension, chronic pain was evaluated by telemedicine today.  Patient today reports she had surgery-arthroplasty of her right shoulder recently.  She reports soon after that she ended up in the hospital with aspiration pneumonia.  She continues to take amoxicillin for the same.  She however is recovering well.  Patient reports her pain is currently under control.  She reports her mood symptoms are currently stable since the pain is under control.  She reports sleep is better.  She denies any suicidality, homicidality or perceptual disturbances.  She reports since she had neuropsychological testing  done and it states that she currently does not have ADHD and she was advised to stop the Vyvanse.  Writer had discussed about tapering off of the Vyvanse last visit.  She reports she was not taking the Vyvanse while she was admitted to the hospital for 4 to 5 days.  Patient agrees to stop the 20 mg.  Patient denies any other concerns today.   Visit Diagnosis:    ICD-10-CM   1. Bipolar disorder, in full remission, most recent episode depressed ([place])  F31.76   2. History of ADHD  Z86.59   3. Memory problem  R41.3     Past Psychiatric History: I have reviewed past psychiatric history from my progress note on 08/10/2017.  Past Medical History:  Past Medical History:  Diagnosis Date  . Arthritis    hands  . Asthma   . Barrett's esophagus   . Cancer [facility])    neuroendocrine ca  . COPD (chronic obstructive pulmonary disease) ([place])   . Depression   . GERD (gastroesophageal reflux disease)   . Hypertension   . Motion sickness    reading in car  . Neuroendocrine carcinoma ([place]) 01/16/2017  . Orthodontics    permanent retainers, top and bottom  . Pneumonia 2017, 2020    Past Surgical History:  Procedure Laterality Date  . ABDOMINAL HYSTERECTOMY    . ANTERIOR CERVICAL DECOMPRESSION/DISCECTOMY FUSION 4 LEVELS N/A 04/09/2019   Procedure: ANTERIOR CERVICAL DECOMPRESSION/DISCECTOMY FUSION 5 LEVELS C3-T1;  Surgeon: Meade Maw, MD;  Location: [facility] ORS;  Service: Neurosurgery;  Laterality: N/A;  . BIOPSY  03/17/2019   Procedure: BIOPSY;  Surgeon: [name], MD;  Location: [place];  Service: Endoscopy;;  . BOWEL RESECTION  07/19/2012  . CESAREAN SECTION     x3  . COLONOSCOPY WITH PROPOFOL N/A 12/02/2019   Procedure: COLONOSCOPY WITH PROPOFOL;  Surgeon: [name], MD;  Location: [facility] ENDOSCOPY;  Service: Endoscopy;  Laterality: N/A;  . ESOPHAGOGASTRODUODENOSCOPY (EGD) WITH PROPOFOL N/A 03/05/2017   Procedure: ESOPHAGOGASTRODUODENOSCOPY (EGD) WITH PROPOFOL;  Surgeon:  [name], MD;  Location: [place];  Service: Endoscopy;  Laterality: N/A;  . ESOPHAGOGASTRODUODENOSCOPY (EGD) WITH PROPOFOL N/A 03/30/2017   REPEAT IN 03/2019  . ESOPHAGOGASTRODUODENOSCOPY (EGD) WITH PROPOFOL N/A 03/17/2019   Procedure: ESOPHAGOGASTRODUODENOSCOPY (EGD) WITH PROPOFOL;  Surgeon: [name], MD;  Location: [place];  Service: Endoscopy;  Laterality: N/A;  . HAND SURGERY Right   . TOTAL SHOULDER ARTHROPLASTY Right 01/05/2020   Procedure: Right total shoulder arthroplasty, biceps tenodesis;  Surgeon: Leim Fabry, MD;  Location: [facility] ORS;  Service: Orthopedics;  Laterality: Right;  . TRANSFORAMINAL LUMBAR INTERBODY FUSION (TLIF) WITH PEDICLE SCREW FIXATION 1 LEVEL N/A 12/05/2017   Procedure: TRANSFORAMINAL LUMBAR INTERBODY FUSION (TLIF) WITH PEDICLE SCREW FIXATION 1 LEVEL-L4-5;  Surgeon: Meade Maw, MD;  Location: [facility] ORS;  Service: Neurosurgery;  Laterality: N/A;    Family Psychiatric History: I have reviewed family psychiatric history from my progress note on 08/10/2017  Family History:  Family History  Problem Relation Age of Onset  . Breast cancer Cousin        mat cousin    Social History: Reviewed social history from my progress note on 08/10/2017 Social History   Socioeconomic History  . Marital status: Married    Spouse name: Not on file  . Number of children: Not on file  . Years of education: Not on file  . Highest education level: Not on file  Occupational History  . Not on file  Tobacco Use  . Smoking status: Former Smoker    Packs/day: 2.00    Years: 37.00    Pack years: 74.00    Types: Cigarettes    Quit date: 07/19/2012    Years since quitting: 7.4  . Smokeless tobacco: Never Used  Vaping Use  . Vaping Use: Never used  Substance and Sexual Activity  . Alcohol use: Yes    Comment: 2 drinks/mo  . Drug use: No  . Sexual activity: Yes  Other Topics Concern  . Not on file  Social History Narrative  . Not on file    Social Determinants of Health   Financial Resource Strain:   . Difficulty of Paying Living Expenses: Not on file  Food Insecurity:   . Worried About Charity fundraiser in the Last Year: Not on file  . Ran Out of Food in the Last Year: Not on file  Transportation Needs:   . Lack of Transportation (Medical): Not on file  . Lack of Transportation (Non-Medical): Not on file  Physical Activity:   . Days of Exercise per Week: Not on file  . Minutes of Exercise per Session: Not on file  Stress:   . Feeling of Stress : Not on file  Social Connections:   . Frequency of Communication with Friends and Family: Not on file  . Frequency of Social Gatherings with Friends and Family: Not on file  . Attends Religious Services: Not on file  . Active Member of Clubs or Organizations: Not on file  . Attends Archivist Meetings: Not on file  . Marital Status: Not on  file    Allergies: No Known Allergies  Metabolic Disorder Labs: Lab Results  Component Value Date   HGBA1C 5.9 (H) 06/13/2017   MPG 122.63 06/13/2017   Lab Results  Component Value Date   PROLACTIN 4.6 (L) 06/13/2017   Lab Results  Component Value Date   CHOL 183 06/13/2017   TRIG 80 06/13/2017   HDL 92 06/13/2017   CHOLHDL 2.0 06/13/2017   VLDL 16 06/13/2017   LDLCALC 75 06/13/2017   Lab Results  Component Value Date   TSH 1.205 06/13/2017    Therapeutic Level Labs: No results found for: LITHIUM No results found for: VALPROATE No components found for:  CBMZ  Current Medications: Current Outpatient Medications  Medication Sig Dispense Refill  . acetaminophen (TYLENOL) 650 MG CR tablet Take 1,950 mg by mouth every 8 (eight) hours.     Marland Kitchen albuterol (PROVENTIL HFA;VENTOLIN HFA) 108 (90 Base) MCG/ACT inhaler Inhale 1-2 puffs into the lungs every 4 (four) hours as needed for wheezing or shortness of breath.     Marland Kitchen amoxicillin-clavulanate (AUGMENTIN) 875-125 MG tablet Take 1 tablet by mouth 2 (two) times  daily.    Marland Kitchen aspirin EC 325 MG EC tablet Take 1 tablet (325 mg total) by mouth daily. 45 tablet 0  . azithromycin (ZITHROMAX) 500 MG tablet Take 500 mg by mouth 3 (three) times a week.     Marland Kitchen buPROPion (WELLBUTRIN SR) 200 MG 12 hr tablet TAKE 1 TABLET TWICE A DAY (Patient taking differently: Take 200 mg by mouth 2 (two) times daily. ) 180 tablet 3  . celecoxib (CELEBREX) 200 MG capsule Take 400 mg by mouth 2 (two) times daily.    . Cholecalciferol (VITAMIN D3) 25 MCG (1000 UT) CAPS Take 1,000 Units by mouth at bedtime.     . docusate sodium (COLACE) 100 MG capsule Take 200 mg by mouth daily.    Marland Kitchen ethambutol (MYAMBUTOL) 400 MG tablet Take 1,400 mg by mouth 3 (three) times a week.     . Fluticasone-Umeclidin-Vilant (TRELEGY ELLIPTA) 100-62.5-25 MCG/INH AEPB Inhale 1 puff into the lungs daily. 180 each 3  . gabapentin (NEURONTIN) 300 MG capsule Take 600 mg by mouth 3 (three) times daily.     Marland Kitchen ipratropium-albuterol (DUONEB) 0.5-2.5 (3) MG/3ML SOLN Inhale 3 mLs into the lungs every 4 (four) hours as needed (Only uses with cold symptoms).     Marland Kitchen lamoTRIgine (LAMICTAL) 200 MG tablet TAKE 1 TABLET TWICE A DAY (DOSE INCREASE) (Patient taking differently: Take 200 mg by mouth 2 (two) times daily. ) 180 tablet 3  . methocarbamol (ROBAXIN) 500 MG tablet Take 2 tablets (1,000 mg total) by mouth every 6 (six) hours. (Patient taking differently: Take 1,000 mg by mouth 3 (three) times daily. ) 80 tablet 0  . Milk Thistle 150 MG CAPS Take 300 mg by mouth daily.    . montelukast (SINGULAIR) 10 MG tablet Take 10 mg by mouth at bedtime.     Marland Kitchen omeprazole (PRILOSEC) 40 MG capsule Take 40 mg by mouth daily.     Marland Kitchen oxyCODONE (OXY IR/ROXICODONE) 5 MG immediate release tablet Take 1-2 tablets (5-10 mg total) by mouth every 4 (four) hours as needed for moderate pain (pain score 4-6). 30 tablet 0  . propranolol (INDERAL) 10 MG tablet TAKE 1 TABLET(10 MG) BY MOUTH THREE TIMES DAILY AS NEEDED FOR SEVERE ANXIETY OR AGITATION  (Patient taking differently: Take 10 mg by mouth 3 (three) times daily as needed (anxiety). ) 270 tablet 0  .  rifampin (RIFADIN) 300 MG capsule Take 600 mg by mouth 3 (three) times a week.     . valACYclovir (VALTREX) 1000 MG tablet Take 2,000 mg by mouth 2 (two) times daily as needed (herpes simplex).     . valsartan-hydrochlorothiazide (DIOVAN-HCT) 160-12.5 MG tablet Take 1 tablet by mouth at bedtime.      No current facility-administered medications for this visit.     Musculoskeletal: Strength & Muscle Tone: UTA Gait & Station: normal Patient leans: N/A  Psychiatric Specialty Exam: Review of Systems  Musculoskeletal: Positive for back pain.       Joint pain- shoulder, S/P surgery- rt sided  All other systems reviewed and are negative.   There were no vitals taken for this visit.There is no height or weight on file to calculate BMI.  General Appearance: Casual  Eye Contact:  Fair  Speech:  Clear and Coherent  Volume:  Normal  Mood:  Euthymic  Affect:  Congruent  Thought Process:  Goal Directed and Descriptions of Associations: Intact  Orientation:  Full (Time, Place, and Person)  Thought Content: Logical   Suicidal Thoughts:  No  Homicidal Thoughts:  No  Memory:  Immediate;   Fair Recent;   Fair Remote;   Fair  Judgement:  Fair  Insight:  Fair  Psychomotor Activity:  Normal  Concentration:  Concentration: Fair and Attention Span: Fair  Recall:  [employer] of Knowledge: Fair  Language: Fair  Akathisia:  No  Handed:  Right  AIMS (if indicated): UTA  Assets:  Communication Skills Desire for Improvement Housing Intimacy Resilience Social Support Talents/Skills Transportation  ADL's:  Intact  Cognition: WNL  Sleep:  Fair   Screenings: PHQ2-9     Pulmonary Rehab from 06/25/2017 in [facility]  PHQ-2 Total Score 4  PHQ-9 Total Score 19       Assessment and Plan: Alexei Ey is a 59 year old Hispanic female who has a history  of bipolar disorder, ADHD, cognitive disorder, COPD, GERD was evaluated by telemedicine today.  Patient with psychosocial stressors of multiple health problems, pain, recent surgery, was evaluated by telemedicine today.  Patient is currently stable on current medication regimen with regards to her mood.  Plan as noted below.  Plan Bipolar disorder in remission Lamictal 200 mg p.o. twice daily Wellbutrin SR 200 mg p.o. twice daily Trazodone 25 to 50 mg p.o. nightly as needed Propranolol 10 mg p.o. 3 times daily as needed for severe anxiety agitation. Patient was referred for CBT-pending  History of ADHD-patient reports she had retesting done recently and it showed that she does not have ADHD. Writer had advised patient to stop taking Vyvanse last visit.  She was advised to taper it off. Patient currently reports she stopped taking Vyvanse 20 mg while she was admitted to the hospital.  Patient advised to stop the Vyvanse. Patient advised to fax a copy of her recent psychological testing.   Memory problems-improving She will continue to follow-up with neurology  Follow-up in clinic in 2 months or sooner if needed.  I have spent atleast 20 minutes face to face with patient today. More than 50 % of the time was spent for obtaining and to review and separately obtained history , ordering medications and test ,psychoeducation and supportive psychotherapy and care coordination,as well as documenting clinical information in electronic health record. This note was generated in part or whole with voice recognition software. Voice recognition is usually quite accurate but there are transcription errors  that can and very often do occur. I apologize for any typographical errors that were not detected and corrected.       [name], MD 01/12/2020, 3:49 PM

## 2020-01-25 NOTE — Anesthesia Postprocedure Evaluation (Signed)
Anesthesia Post Note  Patient: Bailey Flores  Procedure(s) Performed: Right total shoulder arthroplasty, biceps tenodesis (Right Shoulder)  Patient location during evaluation: PACU Anesthesia Type: General Level of consciousness: awake and alert and oriented Pain management: pain level controlled Vital Signs Assessment: post-procedure vital signs reviewed and stable Respiratory status: spontaneous breathing Cardiovascular status: blood pressure returned to baseline Anesthetic complications: no   No complications documented.   Last Vitals:  Vitals:   01/06/20 0808 01/06/20 1214  BP: 119/71 108/63  Pulse: 92 (!) 101  Resp: 18 17  Temp: 36.9 C 37.7 C  SpO2: 95% 94%    Last Pain:  Vitals:   01/06/20 1214  TempSrc: Oral  PainSc:                  Maureena Dabbs

## 2020-03-17 ENCOUNTER — Telehealth (INDEPENDENT_AMBULATORY_CARE_PROVIDER_SITE_OTHER): Admitting: Psychiatry

## 2020-03-17 ENCOUNTER — Encounter: Payer: Self-pay | Admitting: Psychiatry

## 2020-03-17 ENCOUNTER — Other Ambulatory Visit: Payer: Self-pay

## 2020-03-17 DIAGNOSIS — F411 Generalized anxiety disorder: Secondary | ICD-10-CM | POA: Diagnosis not present

## 2020-03-17 DIAGNOSIS — F3176 Bipolar disorder, in full remission, most recent episode depressed: Secondary | ICD-10-CM

## 2020-03-17 DIAGNOSIS — Z8659 Personal history of other mental and behavioral disorders: Secondary | ICD-10-CM

## 2020-03-17 MED ORDER — VENLAFAXINE HCL ER 37.5 MG PO CP24: 37.5000 mg | ORAL_CAPSULE | Freq: Every day | ORAL | 1 refills | Status: DC

## 2020-03-17 NOTE — Progress Notes (Signed)
Virtual Visit via Video Note  I connected with Bailey Flores on 03/17/20 at  4:00 PM EDT by a video enabled telemedicine application and verified that I am speaking with the correct person using two identifiers.  Location Provider Location : [facility] Patient Location : Home  Participants: Patient , Provider   I discussed the limitations of evaluation and management by telemedicine and the availability of in person appointments. The patient expressed understanding and agreed to proceed.      I discussed the assessment and treatment plan with the patient. The patient was provided an opportunity to ask questions and all were answered. The patient agreed with the plan and demonstrated an understanding of the instructions.   The patient was advised to call back or seek an in-person evaluation if the symptoms worsen or if the condition fails to improve as anticipated.   [facility] MD OP Progress Note  03/17/2020 6:20 PM Bailey Flores  MRN:  [id number]  Chief Complaint:  Chief Complaint    Follow-up     HPI: Bailey Flores is a 59 year old female, lives in [place], married, has a history of bipolar disorder, ADHD, cognitive disorder, COPD, hypertension, chronic pain was evaluated by telemedicine today.  Patient is currently recovering from her surgery.  Patient today appeared to be anxious and restless.  She reports she is frustrated with the fact that she is currently struggling with a lot of attention and focus problems.  She had a neuropsychological testing done which ruled out ADHD and she was taken off of the Vyvanse.  She reports since being off of the Vyvanse she has been struggling with inability to complete task and not being able to stay on task and focus much with anything at home.  Patient reports sleep is good.  She currently denies any suicidality, homicidality or perceptual disturbances.  Collateral information obtained from husband who also reports  patient needs some help with her attention and focus.    Visit Diagnosis:    ICD-10-CM   1. Bipolar disorder, in full remission, most recent episode depressed ([place])  F31.76   2. GAD (generalized anxiety disorder)  F41.1 venlafaxine XR (EFFEXOR-XR) 37.5 MG 24 hr capsule  3. History of ADHD  Z86.59     Past Psychiatric History: I have reviewed past psychiatric history from my progress note on 08/10/2017.  Past Medical History:  Past Medical History:  Diagnosis Date  . Arthritis    hands  . Asthma   . Barrett's esophagus   . Cancer [facility])    neuroendocrine ca  . COPD (chronic obstructive pulmonary disease) ([place])   . Depression   . GERD (gastroesophageal reflux disease)   . Hypertension   . Motion sickness    reading in car  . 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 Orthodontics    permanent retainers, top and bottom  . Pneumonia 2017, 2020    Past Surgical History:  Procedure Laterality Date  . ABDOMINAL HYSTERECTOMY    . ANTERIOR CERVICAL DECOMPRESSION/DISCECTOMY FUSION 4 LEVELS N/A 04/09/2019   Procedure: ANTERIOR CERVICAL DECOMPRESSION/DISCECTOMY FUSION 5 LEVELS C3-T1;  Surgeon: Meade Maw, MD;  Location: [facility] OR[facility];  Service: Neurosurgery;  Laterality: N/A;  . BIOPSY  03/17/2019   Procedure: BIOPSY;  Surgeon: [name], MD;  Location: [place];  Service: Endoscopy;;  . BOWEL RESECTION  07/19/2012  . CESAREAN SECTION     x3  . COLONOSCOPY WITH PROPOFOL N/A 12/02/2019   Procedure: COLONOSCOPY WITH PROPOFOL;  Surgeon: [name],  MD;  Location: [facility];  Service: Endoscopy;  Laterality: N/A;  . ESOPHAGOGASTRODUODENOSCOPY (EGD) WITH PROPOFOL N/A 03/05/2017   Procedure: ESOPHAGOGASTRODUODENOSCOPY (EGD) WITH PROPOFOL;  Surgeon: [name], MD;  Location: [place];  Service: Endoscopy;  Laterality: N/A;  . ESOPHAGOGASTRODUODENOSCOPY (EGD) WITH PROPOFOL N/A 03/30/2017   REPEAT IN 03/2019  . ESOPHAGOGASTRODUODENOSCOPY (EGD) WITH  PROPOFOL N/A 03/17/2019   Procedure: ESOPHAGOGASTRODUODENOSCOPY (EGD) WITH PROPOFOL;  Surgeon: [name], MD;  Location: [place];  Service: Endoscopy;  Laterality: N/A;  . HAND SURGERY Right   . TOTAL SHOULDER ARTHROPLASTY Right 01/05/2020   Procedure: Right total shoulder arthroplasty, biceps tenodesis;  Surgeon: Leim Fabry, MD;  Location: [facility];  Service: Orthopedics;  Laterality: Right;  . TRANSFORAMINAL LUMBAR INTERBODY FUSION (TLIF) WITH PEDICLE SCREW FIXATION 1 LEVEL N/A 12/05/2017   Procedure: TRANSFORAMINAL LUMBAR INTERBODY FUSION (TLIF) WITH PEDICLE SCREW FIXATION 1 LEVEL-L4-5;  Surgeon: Meade Maw, MD;  Location: [facility];  Service: Neurosurgery;  Laterality: N/A;    Family Psychiatric History: I have reviewed family psychiatric history from my progress note on 08/10/2017.  Family History:  Family History  Problem Relation Age of Onset  . Breast cancer Cousin        mat cousin    Social History: I have reviewed social history from my progress note on 08/10/2017. Social History   Socioeconomic History  . Marital status: Married    Spouse name: Not on file  . Number of children: Not on file  . Years of education: Not on file  . Highest education level: Not on file  Occupational History  . Not on file  Tobacco Use  . Smoking status: Former Smoker    Packs/day: 2.00    Years: 37.00    Pack years: 74.00    Types: Cigarettes    Quit date: 07/19/2012    Years since quitting: 7.6  . Smokeless tobacco: Never Used  Vaping Use  . Vaping Use: Never used  Substance and Sexual Activity  . Alcohol use: Yes    Comment: 2 drinks/mo  . Drug use: No  . Sexual activity: Yes  Other Topics Concern  . Not on file  Social History Narrative  . Not on file   Social Determinants of Health   Financial Resource Strain:   . Difficulty of Paying Living Expenses: Not on file  Food Insecurity:   . Worried About Charity fundraiser in the Last Year: Not on file   . Ran Out of Food in the Last Year: Not on file  Transportation Needs:   . Lack of Transportation (Medical): Not on file  . Lack of Transportation (Non-Medical): Not on file  Physical Activity:   . Days of Exercise per Week: Not on file  . Minutes of Exercise per Session: Not on file  Stress:   . Feeling of Stress : Not on file  Social Connections:   . Frequency of Communication with Friends and Family: Not on file  . Frequency of Social Gatherings with Friends and Family: Not on file  . Attends Religious Services: Not on file  . Active Member of Clubs or Organizations: Not on file  . Attends Archivist Meetings: Not on file  . Marital Status: Not on file    Allergies: No Known Allergies  Metabolic Disorder Labs: Lab Results  Component Value Date   HGBA1C 5.9 (H) 06/13/2017   MPG 122.63 06/13/2017   Lab Results  Component Value Date   PROLACTIN 4.6 (L)  06/13/2017   Lab Results  Component Value Date   CHOL 183 06/13/2017   TRIG 80 06/13/2017   HDL 92 06/13/2017   CHOLHDL 2.0 06/13/2017   VLDL 16 06/13/2017   LDLCALC 75 06/13/2017   Lab Results  Component Value Date   TSH 1.205 06/13/2017    Therapeutic Level Labs: No results found for: LITHIUM No results found for: VALPROATE No components found for:  CBMZ  Current Medications: Current Outpatient Medications  Medication Sig Dispense Refill  . acetaminophen (TYLENOL) 650 MG CR tablet Take 1,950 mg by mouth every 8 (eight) hours.     Marland Kitchen albuterol (PROVENTIL HFA;VENTOLIN HFA) 108 (90 Base) MCG/ACT inhaler Inhale 1-2 puffs into the lungs every 4 (four) hours as needed for wheezing or shortness of breath.     Marland Kitchen amoxicillin (AMOXIL) 500 MG capsule Take 1,000 mg by mouth 2 (two) times daily.    Marland Kitchen aspirin 325 MG tablet Take by mouth.    Marland Kitchen azithromycin (ZITHROMAX) 500 MG tablet Take 500 mg by mouth 3 (three) times a week.     Marland Kitchen buPROPion (WELLBUTRIN SR) 200 MG 12 hr tablet TAKE 1 TABLET TWICE A DAY (Patient  taking differently: Take 200 mg by mouth 2 (two) times daily. ) 180 tablet 3  . celecoxib (CELEBREX) 200 MG capsule Take 400 mg by mouth 2 (two) times daily.    . Cholecalciferol (VITAMIN D3) 25 MCG (1000 UT) CAPS Take 1,000 Units by mouth at bedtime.     . docusate sodium (COLACE) 100 MG capsule Take 200 mg by mouth daily.    Marland Kitchen ethambutol (MYAMBUTOL) 400 MG tablet Take 1,400 mg by mouth 3 (three) times a week.     . Fluticasone-Umeclidin-Vilant (TRELEGY ELLIPTA) 100-62.5-25 MCG/INH AEPB Inhale 1 puff into the lungs daily. 180 each 3  . gabapentin (NEURONTIN) 300 MG capsule Take 600 mg by mouth 3 (three) times daily.     Marland Kitchen ibuprofen (ADVIL) 200 MG tablet Take by mouth.    Marland Kitchen ipratropium-albuterol (DUONEB) 0.5-2.5 (3) MG/3ML SOLN Inhale 3 mLs into the lungs every 4 (four) hours as needed (Only uses with cold symptoms).     Marland Kitchen lamoTRIgine (LAMICTAL) 200 MG tablet TAKE 1 TABLET TWICE A DAY (DOSE INCREASE) (Patient taking differently: Take 200 mg by mouth 2 (two) times daily. ) 180 tablet 3  . methocarbamol (ROBAXIN) 500 MG tablet Take 2 tablets (1,000 mg total) by mouth every 6 (six) hours. (Patient taking differently: Take 1,000 mg by mouth 3 (three) times daily. ) 80 tablet 0  . Milk Thistle 150 MG CAPS Take 300 mg by mouth daily.    . montelukast (SINGULAIR) 10 MG tablet Take 10 mg by mouth at bedtime.     Marland Kitchen omeprazole (PRILOSEC) 40 MG capsule Take 40 mg by mouth daily.     Marland Kitchen oxyCODONE-acetaminophen (PERCOCET) 10-325 MG tablet Take 1 tablet by mouth every 6 (six) hours as needed.    . propranolol (INDERAL) 10 MG tablet TAKE 1 TABLET(10 MG) BY MOUTH THREE TIMES DAILY AS NEEDED FOR SEVERE ANXIETY OR AGITATION (Patient taking differently: Take 10 mg by mouth 3 (three) times daily as needed (anxiety). ) 270 tablet 0  . rifampin (RIFADIN) 300 MG capsule Take 600 mg by mouth 3 (three) times a week.     . valACYclovir (VALTREX) 1000 MG tablet Take 2,000 mg by mouth 2 (two) times daily as needed (herpes  simplex).     . valsartan-hydrochlorothiazide (DIOVAN-HCT) 160-12.5 MG tablet Take 1 tablet  by mouth at bedtime.     Marland Kitchen amoxicillin-clavulanate (AUGMENTIN) 875-125 MG tablet Take 1 tablet by mouth 2 (two) times daily. (Patient not taking: Reported on 03/17/2020)    . oxyCODONE (OXY IR/ROXICODONE) 5 MG immediate release tablet Take 1-2 tablets (5-10 mg total) by mouth every 4 (four) hours as needed for moderate pain (pain score 4-6). (Patient not taking: Reported on 03/17/2020) 30 tablet 0  . venlafaxine XR (EFFEXOR-XR) 37.5 MG 24 hr capsule Take 1 capsule (37.5 mg total) by mouth daily with breakfast. 30 capsule 1   No current facility-administered medications for this visit.     Musculoskeletal: Strength & Muscle Tone: UTA Gait & Station: normal Patient leans: N/A  Psychiatric Specialty Exam: Review of Systems  Musculoskeletal: Positive for back pain.  Psychiatric/Behavioral: Positive for decreased concentration. The patient is nervous/anxious.   All other systems reviewed and are negative.   There were no vitals taken for this visit.There is no height or weight on file to calculate BMI.  General Appearance: Casual  Eye Contact:  Good  Speech:  Clear and Coherent  Volume:  Normal  Mood:  Anxious and Depressed  Affect:  Congruent  Thought Process:  Goal Directed and Descriptions of Associations: Intact  Orientation:  Full (Time, Place, and Person)  Thought Content: Logical   Suicidal Thoughts:  No  Homicidal Thoughts:  No  Memory:  Immediate;   Fair Recent;   Fair Remote;   Fair  Judgement:  Fair  Insight:  Fair  Psychomotor Activity:  Normal  Concentration:  Concentration: Fair and Attention Span: Fair  Recall:  [employer] of Knowledge: Fair  Language: Fair  Akathisia:  No  Handed:  Right  AIMS (if indicated): UTA  Assets:  Communication Skills Desire for Improvement Housing Social Support  ADL's:  Intact  Cognition: WNL  Sleep:  Fair   Screenings: PHQ2-9      Pulmonary Rehab from 06/25/2017 in [facility]  PHQ-2 Total Score 4  PHQ-9 Total Score 19       Assessment and Plan: Camiah Humm is a 59 year old Hispanic female who has a history of bipolar disorder, COPD, GERD was evaluated by telemedicine today.  Patient with psychosocial stressors of recent surgery, pain.  Patient currently struggles with restlessness, anxiety and inattention, will continue to make medication changes.  Plan Bipolar disorder-in remission Lamictal 200 mg p.o. twice daily Wellbutrin SR 200 mg p.o. twice daily Trazodone 25 to 50 mg p.o. nightly as needed Propranolol 10 mg p.o. 3 times daily as needed for severe anxiety/agitation.   GAD-unstable It is likely that based on neuropsychological testing her attention and current mood symptoms are due to anxiety or bipolar sx rather than ADHD.  She also has a normal memory based on the neuropsychological testing. Will not restart the stimulant medication. Start venlafaxine extended release 37.5 mg p.o. daily Will refer for CBT.  I have reviewed neuropsychological testing from [facility] neuropsychiatric Dr. Lynita Lombard dated 12/09/2019-based on the testing she is intact across all neurocognitive domains.  The only concern noted was on a single measure of visual organization which is commonly affected in those with bipolar disorder.  There is also no indication of ADHD.  More likely any subjective concern regarding her cognition are thought to be caused or exacerbated by her significant emotional  distress.  While Ms. Troup has a history of bipolar disorder current concerns seem more focused on depression and anxiety which is consistent with her recent increase  in emotionality.  She is encouraged to re engage in real regular individual psychotherapy to process any recent stressors and really focus on coping skills.  If patient continues to struggle with attention and focus problems, will reassess her mood  stabilizer.  We will also reevaluate for bipolar symptoms.  Follow-up in clinic in 3 weeks or sooner if needed.  I have spent atleast 30 minutes face to face by video with patient today. More than 50 % of the time was spent for preparing to see the patient ( e.g., review of test, records ), obtaining and to review and separately obtained history , ordering medications and test ,psychoeducation and supportive psychotherapy and care coordination,as well as documenting clinical information in electronic health record. This note was generated in part or whole with voice recognition software. Voice recognition is usually quite accurate but there are transcription errors that can and very often do occur. I apologize for any typographical errors that were not detected and corrected.      [name], MD 03/18/2020, 8:15 AM

## 2020-03-18 NOTE — Addendum Note (Signed)
Addended byUrsula Alert on: 03/18/2020 08:15 AM   Modules accepted: Level of Service

## 2020-04-06 DIAGNOSIS — J45909 Unspecified asthma, uncomplicated: Secondary | ICD-10-CM | POA: Insufficient documentation

## 2020-04-06 DIAGNOSIS — K5900 Constipation, unspecified: Secondary | ICD-10-CM | POA: Insufficient documentation

## 2020-04-06 DIAGNOSIS — C7A8 Other malignant neuroendocrine tumors: Secondary | ICD-10-CM | POA: Insufficient documentation

## 2020-04-06 DIAGNOSIS — F419 Anxiety disorder, unspecified: Secondary | ICD-10-CM | POA: Insufficient documentation

## 2020-04-14 DIAGNOSIS — R4189 Other symptoms and signs involving cognitive functions and awareness: Secondary | ICD-10-CM | POA: Insufficient documentation

## 2020-04-14 DIAGNOSIS — R4701 Aphasia: Secondary | ICD-10-CM | POA: Insufficient documentation

## 2020-04-19 ENCOUNTER — Encounter: Payer: Self-pay | Admitting: Psychiatry

## 2020-04-19 ENCOUNTER — Telehealth (INDEPENDENT_AMBULATORY_CARE_PROVIDER_SITE_OTHER): Admitting: Psychiatry

## 2020-04-19 ENCOUNTER — Other Ambulatory Visit: Payer: Self-pay

## 2020-04-19 DIAGNOSIS — Z8659 Personal history of other mental and behavioral disorders: Secondary | ICD-10-CM

## 2020-04-19 DIAGNOSIS — F3176 Bipolar disorder, in full remission, most recent episode depressed: Secondary | ICD-10-CM | POA: Diagnosis not present

## 2020-04-19 DIAGNOSIS — F411 Generalized anxiety disorder: Secondary | ICD-10-CM

## 2020-04-19 MED ORDER — VENLAFAXINE HCL ER 37.5 MG PO CP24: 75.0000 mg | ORAL_CAPSULE | Freq: Every day | ORAL | 1 refills | Status: DC

## 2020-04-19 NOTE — Progress Notes (Signed)
Virtual Visit via Telephone Note  I connected with Bailey Flores on 04/19/20 at  4:00 PM EST by telephone and verified that I am speaking with the correct person using two identifiers.  Location Provider Location : [facility] Patient Location : Home  Participants: Patient , Provider    I discussed the limitations, risks, security and privacy concerns of performing an evaluation and management service by telephone and the availability of in person appointments. I also discussed with the patient that there may be a patient responsible charge related to this service. The patient expressed understanding and agreed to proceed.  I discussed the assessment and treatment plan with the patient. The patient was provided an opportunity to ask questions and all were answered. The patient agreed with the plan and demonstrated an understanding of the instructions.   The patient was advised to call back or seek an in-person evaluation if the symptoms worsen or if the condition fails to improve as anticipated.     [facility] MD OP Progress Note  04/19/2020 4:26 PM Bailey Flores  MRN:  [id number]  Chief Complaint:  Chief Complaint    Follow-up     HPI: Bailey Flores is a 59 year old female, lives in [place], has a history of bipolar disorder, ADHD, cognitive disorder, COPD, hypertension, chronic pain was evaluated by telemedicine today.    Patient today reports she is currently making progress with regards to her anxiety.  She reports her venlafaxine dosage was recently increased by neurologist.  This was a week ago.  She reports she takes a second dosage of 37.5 mg at bedtime.  She reports it is likely affecting her sleep since she wakes up earlier now since going up on the dosage.  She reports she continues to have problem with concentration and organization.   She reports her pain has improved a lot.  She does not take a lot of oxycodone anymore.  She denies any suicidality,  homicidality or perceptual disturbances.  She reports she had a good Thanksgiving holiday, her daughter visited and stayed with her for a week.  She reports she enjoyed her time with her daughter.  Patient denies any other concerns today.    Visit Diagnosis:    ICD-10-CM   1. Bipolar disorder, in full remission, most recent episode depressed ([place])  F31.76   2. GAD (generalized anxiety disorder)  F41.1 venlafaxine XR (EFFEXOR-XR) 37.5 MG 24 hr capsule  3. History of ADHD  Z86.59     Past Psychiatric History: I have reviewed past psychiatric history from my progress note from 08/10/2017  Past Medical History:  Past Medical History:  Diagnosis Date  . Arthritis    hands  . Asthma   . Barrett's esophagus   . Cancer [facility])    neuroendocrine ca  . COPD (chronic obstructive pulmonary disease) ([place])   . Depression   . GERD (gastroesophageal reflux disease)   . Hypertension   . Motion sickness    reading in car  . 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 Orthodontics    permanent retainers, top and bottom  . Pneumonia 2017, 2020    Past Surgical History:  Procedure Laterality Date  . ABDOMINAL HYSTERECTOMY    . ANTERIOR CERVICAL DECOMPRESSION/DISCECTOMY FUSION 4 LEVELS N/A 04/09/2019   Procedure: ANTERIOR CERVICAL DECOMPRESSION/DISCECTOMY FUSION 5 LEVELS C3-T1;  Surgeon: Meade Maw, MD;  Location: [facility];  Service: Neurosurgery;  Laterality: N/A;  . BIOPSY  03/17/2019   Procedure: BIOPSY;  Surgeon: [name], MD;  Location: [place];  Service: Endoscopy;;  . BOWEL RESECTION  07/19/2012  . CESAREAN SECTION     x3  . COLONOSCOPY WITH PROPOFOL N/A 12/02/2019   Procedure: COLONOSCOPY WITH PROPOFOL;  Surgeon: [name], MD;  Location: [facility] ENDOSCOPY;  Service: Endoscopy;  Laterality: N/A;  . ESOPHAGOGASTRODUODENOSCOPY (EGD) WITH PROPOFOL N/A 03/05/2017   Procedure: ESOPHAGOGASTRODUODENOSCOPY (EGD) WITH PROPOFOL;  Surgeon: [name], MD;  Location:  [place];  Service: Endoscopy;  Laterality: N/A;  . ESOPHAGOGASTRODUODENOSCOPY (EGD) WITH PROPOFOL N/A 03/30/2017   REPEAT IN 03/2019  . ESOPHAGOGASTRODUODENOSCOPY (EGD) WITH PROPOFOL N/A 03/17/2019   Procedure: ESOPHAGOGASTRODUODENOSCOPY (EGD) WITH PROPOFOL;  Surgeon: [name], MD;  Location: [place];  Service: Endoscopy;  Laterality: N/A;  . HAND SURGERY Right   . TOTAL SHOULDER ARTHROPLASTY Right 01/05/2020   Procedure: Right total shoulder arthroplasty, biceps tenodesis;  Surgeon: Leim Fabry, MD;  Location: [facility] ORS;  Service: Orthopedics;  Laterality: Right;  . TRANSFORAMINAL LUMBAR INTERBODY FUSION (TLIF) WITH PEDICLE SCREW FIXATION 1 LEVEL N/A 12/05/2017   Procedure: TRANSFORAMINAL LUMBAR INTERBODY FUSION (TLIF) WITH PEDICLE SCREW FIXATION 1 LEVEL-L4-5;  Surgeon: Meade Maw, MD;  Location: [facility];  Service: Neurosurgery;  Laterality: N/A;    Family Psychiatric History: Reviewed family psychiatric history from my progress note on 08/10/2017  Family History:  Family History  Problem Relation Age of Onset  . Breast cancer Cousin        mat cousin    Social History: Reviewed social history from my progress note on 08/10/2017 Social History   Socioeconomic History  . Marital status: Married    Spouse name: Not on file  . Number of children: Not on file  . Years of education: Not on file  . Highest education level: Not on file  Occupational History  . Not on file  Tobacco Use  . Smoking status: Former Smoker    Packs/day: 2.00    Years: 37.00    Pack years: 74.00    Types: Cigarettes    Quit date: 07/19/2012    Years since quitting: 7.7  . Smokeless tobacco: Never Used  Vaping Use  . Vaping Use: Never used  Substance and Sexual Activity  . Alcohol use: Yes    Comment: 2 drinks/mo  . Drug use: No  . Sexual activity: Yes  Other Topics Concern  . Not on file  Social History Narrative  . Not on file   Social Determinants of Health    Financial Resource Strain:   . Difficulty of Paying Living Expenses: Not on file  Food Insecurity:   . Worried About Charity fundraiser in the Last Year: Not on file  . Ran Out of Food in the Last Year: Not on file  Transportation Needs:   . Lack of Transportation (Medical): Not on file  . Lack of Transportation (Non-Medical): Not on file  Physical Activity:   . Days of Exercise per Week: Not on file  . Minutes of Exercise per Session: Not on file  Stress:   . Feeling of Stress : Not on file  Social Connections:   . Frequency of Communication with Friends and Family: Not on file  . Frequency of Social Gatherings with Friends and Family: Not on file  . Attends Religious Services: Not on file  . Active Member of Clubs or Organizations: Not on file  . Attends Archivist Meetings: Not on file  . Marital Status: Not on file    Allergies: No Known  Allergies  Metabolic Disorder Labs: Lab Results  Component Value Date   HGBA1C 5.9 (H) 06/13/2017   MPG 122.63 06/13/2017   Lab Results  Component Value Date   PROLACTIN 4.6 (L) 06/13/2017   Lab Results  Component Value Date   CHOL 183 06/13/2017   TRIG 80 06/13/2017   HDL 92 06/13/2017   CHOLHDL 2.0 06/13/2017   VLDL 16 06/13/2017   LDLCALC 75 06/13/2017   Lab Results  Component Value Date   TSH 1.205 06/13/2017    Therapeutic Level Labs: No results found for: LITHIUM No results found for: VALPROATE No components found for:  CBMZ  Current Medications: Current Outpatient Medications  Medication Sig Dispense Refill  . acetaminophen (TYLENOL) 650 MG CR tablet Take 1,950 mg by mouth every 8 (eight) hours.     Marland Kitchen albuterol (PROVENTIL HFA;VENTOLIN HFA) 108 (90 Base) MCG/ACT inhaler Inhale 1-2 puffs into the lungs every 4 (four) hours as needed for wheezing or shortness of breath.     Marland Kitchen amoxicillin (AMOXIL) 500 MG capsule Take 1,000 mg by mouth 2 (two) times daily.    Marland Kitchen amoxicillin-clavulanate (AUGMENTIN) 875-125  MG tablet Take 1 tablet by mouth 2 (two) times daily. (Patient not taking: Reported on 03/17/2020)    . aspirin 325 MG tablet Take by mouth.    Marland Kitchen azithromycin (ZITHROMAX) 500 MG tablet Take 500 mg by mouth 3 (three) times a week.     Marland Kitchen buPROPion (WELLBUTRIN SR) 200 MG 12 hr tablet TAKE 1 TABLET TWICE A DAY (Patient taking differently: Take 200 mg by mouth 2 (two) times daily. ) 180 tablet 3  . celecoxib (CELEBREX) 200 MG capsule Take 400 mg by mouth 2 (two) times daily.    . Cholecalciferol (VITAMIN D3) 25 MCG (1000 UT) CAPS Take 1,000 Units by mouth at bedtime.     . docusate sodium (COLACE) 100 MG capsule Take 200 mg by mouth daily.    Marland Kitchen ethambutol (MYAMBUTOL) 400 MG tablet Take 1,400 mg by mouth 3 (three) times a week.     . Fluticasone-Umeclidin-Vilant (TRELEGY ELLIPTA) 100-62.5-25 MCG/INH AEPB Inhale 1 puff into the lungs daily. 180 each 3  . gabapentin (NEURONTIN) 300 MG capsule Take 600 mg by mouth 3 (three) times daily.     Marland Kitchen ibuprofen (ADVIL) 200 MG tablet Take by mouth.    Marland Kitchen ipratropium-albuterol (DUONEB) 0.5-2.5 (3) MG/3ML SOLN Inhale 3 mLs into the lungs every 4 (four) hours as needed (Only uses with cold symptoms).     Marland Kitchen lamoTRIgine (LAMICTAL) 200 MG tablet TAKE 1 TABLET TWICE A DAY (DOSE INCREASE) (Patient taking differently: Take 200 mg by mouth 2 (two) times daily. ) 180 tablet 3  . methocarbamol (ROBAXIN) 500 MG tablet Take 2 tablets (1,000 mg total) by mouth every 6 (six) hours. (Patient taking differently: Take 1,000 mg by mouth 3 (three) times daily. ) 80 tablet 0  . Milk Thistle 150 MG CAPS Take 300 mg by mouth daily.    . montelukast (SINGULAIR) 10 MG tablet Take 10 mg by mouth at bedtime.     Marland Kitchen omeprazole (PRILOSEC) 40 MG capsule Take 40 mg by mouth daily.     Marland Kitchen oxyCODONE (OXY IR/ROXICODONE) 5 MG immediate release tablet Take 1-2 tablets (5-10 mg total) by mouth every 4 (four) hours as needed for moderate pain (pain score 4-6). (Patient not taking: Reported on 03/17/2020) 30  tablet 0  . oxyCODONE-acetaminophen (PERCOCET) 10-325 MG tablet Take 1 tablet by mouth every 6 (six) hours as  needed.    . propranolol (INDERAL) 10 MG tablet TAKE 1 TABLET(10 MG) BY MOUTH THREE TIMES DAILY AS NEEDED FOR SEVERE ANXIETY OR AGITATION (Patient taking differently: Take 10 mg by mouth 3 (three) times daily as needed (anxiety). ) 270 tablet 0  . rifampin (RIFADIN) 300 MG capsule Take 600 mg by mouth 3 (three) times a week.     . traZODone (DESYREL) 50 MG tablet trazodone 50 mg tablet    . valACYclovir (VALTREX) 1000 MG tablet Take 2,000 mg by mouth 2 (two) times daily as needed (herpes simplex).     . valsartan-hydrochlorothiazide (DIOVAN-HCT) 160-12.5 MG tablet Take 1 tablet by mouth at bedtime.     Marland Kitchen venlafaxine XR (EFFEXOR-XR) 37.5 MG 24 hr capsule Take 2 capsules (75 mg total) by mouth daily with breakfast. 30 capsule 1   No current facility-administered medications for this visit.     Musculoskeletal: Strength & Muscle Tone: UTA Gait & Station: UTA Patient leans: N/A  Psychiatric Specialty Exam: Review of Systems  Psychiatric/Behavioral: The patient is nervous/anxious.   All other systems reviewed and are negative.   There were no vitals taken for this visit.There is no height or weight on file to calculate BMI.  General Appearance: UTA  Eye Contact:  UTA  Speech:  Clear and Coherent  Volume:  Normal  Mood:  Anxious  Affect:  UTA  Thought Process:  Goal Directed and Descriptions of Associations: Intact  Orientation:  Full (Time, Place, and Person)  Thought Content: Logical   Suicidal Thoughts:  No  Homicidal Thoughts:  No  Memory:  Immediate;   Fair Recent;   Fair Remote;   Fair  Judgement:  Fair  Insight:  Fair  Psychomotor Activity:  UTA  Concentration:  Concentration: Fair and Attention Span: Fair  Recall:  [employer] of Knowledge: Fair  Language: Fair  Akathisia:  No  Handed:  Right  AIMS (if indicated): UTA  Assets:  Communication  Skills Housing Intimacy Social Support  ADL's:  Intact  Cognition: WNL  Sleep:  Improving   Screenings: PHQ2-9     Pulmonary Rehab from 06/25/2017 in [facility]  PHQ-2 Total Score 4  PHQ-9 Total Score 19       Assessment and Plan: Bailey Flores is a 59 year old Hispanic female who has a history of bipolar disorder, COPD, GERD was evaluated by telemedicine today.  Patient is currently making progress although she continues to struggle with attention and focus problems.  Plan as noted below.  Plan Bipolar disorder in remission Lamictal 200 mg p.o. twice daily Wellbutrin SR 200 mg p.o. twice daily Trazodone 25 to 50 mg p.o. nightly as needed Propranolol 10 mg p.o. 3 times daily as needed for severe anxiety/agitation  GAD-improving Venlafaxine 37.5 mg twice a day. Advised patient to take the venlafaxine earlier during the day since it is likely affecting her sleep. Will refer her again for CBT, since she reports she never got a call back from therapist.   Patient's attention and focus problems likely multifactorial-polypharmacy, mental health problems, anxiety, sleep problems.  We will monitor closely.   Follow-up in clinic in 4 weeks or sooner if needed.  I have spent atleast 20 minutes non face to face with patient today. More than 50 % of the time was spent for preparing to see the patient ( e.g., review of test, records ),ordering medications and test ,psychoeducation and supportive psychotherapy and care coordination,as well as documenting clinical information in  electronic health record. This note was generated in part or whole with voice recognition software. Voice recognition is usually quite accurate but there are transcription errors that can and very often do occur. I apologize for any typographical errors that were not detected and corrected.        [name], MD 04/20/2020, 8:26 AM

## 2020-04-21 ENCOUNTER — Other Ambulatory Visit: Payer: Self-pay

## 2020-04-21 ENCOUNTER — Ambulatory Visit (INDEPENDENT_AMBULATORY_CARE_PROVIDER_SITE_OTHER): Admitting: Licensed Clinical Social Worker

## 2020-04-21 ENCOUNTER — Encounter: Payer: Self-pay | Admitting: Licensed Clinical Social Worker

## 2020-04-21 DIAGNOSIS — R413 Other amnesia: Secondary | ICD-10-CM

## 2020-04-21 DIAGNOSIS — F3176 Bipolar disorder, in full remission, most recent episode depressed: Secondary | ICD-10-CM

## 2020-04-21 DIAGNOSIS — F411 Generalized anxiety disorder: Secondary | ICD-10-CM | POA: Diagnosis not present
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
## 2020-04-27 ENCOUNTER — Telehealth: Payer: Self-pay | Admitting: Pulmonary Disease

## 2020-04-27 NOTE — Telephone Encounter (Signed)
Dr. Halford Chessman when I called Mrs. Thibault today trying to schedule her 6 months CT she informed me that she has now moved to [place], [place] and she wanted the CT to be done in [place].  I have faxed the order and ask them to send the CT results to you on a disc for you to review.  Mrs. Schupp is scheduled on 05/04/2020 @ 2:00pm. I also told Mrs. Corbello to make sure they sent the Ct on a disc to you

## 2020-05-03 ENCOUNTER — Telehealth: Payer: Self-pay

## 2020-05-03 DIAGNOSIS — F411 Generalized anxiety disorder: Secondary | ICD-10-CM

## 2020-05-03 MED ORDER — VENLAFAXINE HCL ER 37.5 MG PO CP24: 75.0000 mg | ORAL_CAPSULE | Freq: Every day | ORAL | 0 refills | Status: DC

## 2020-05-03 NOTE — Telephone Encounter (Signed)
I have sent Effexor to pharmacy.

## 2020-05-03 NOTE — Telephone Encounter (Signed)
received a fax requesting a 90 day supply on the venlafaxine hcl er 37.5 mg    venlafaxine XR (EFFEXOR-XR) 37.5 MG 24 hr capsule Medication Date: 04/19/2020 Department: [facility] Ordering/Authorizing: [name], MD    Order Providers  Prescribing Provider Encounter Provider  [name], MD [name], MD   Outpatient Medication Detail   Disp Refills Start End   venlafaxine XR (EFFEXOR-XR) 37.5 MG 24 hr capsule 30 capsule 1 04/19/2020    Sig - Route: Take 2 capsules (75 mg total) by mouth daily with breakfast. - Oral   Class: No Print

## 2020-05-07 ENCOUNTER — Ambulatory Visit: Admitting: Licensed Clinical Social Worker

## 2020-05-07 ENCOUNTER — Other Ambulatory Visit: Payer: Self-pay

## 2020-05-07 ENCOUNTER — Telehealth: Payer: Self-pay | Admitting: Pulmonary Disease

## 2020-05-07 NOTE — Telephone Encounter (Signed)
Patient returned phone call. Schedule TELEVISIT for Wednesday 05/12/2020 with Tonia Brooms NP at noon. Patient stated that she wanted to go over CT results over the phone due to having to drive too far to come to any of the offices. Regulatory affairs officer offered all three locations for patient to come in). Will update Dr Halford Chessman.

## 2020-05-07 NOTE — Telephone Encounter (Signed)
CT chest 05/04/20 >> 9 mm nodule LUL new (scan done at [facility])  Please schedule an ROV with me or NP to review CT chest results.

## 2020-05-07 NOTE — Telephone Encounter (Signed)
Called and left message on voicemail to please return phone call. Contact number provided. 

## 2020-05-10 ENCOUNTER — Telehealth: Payer: Self-pay | Admitting: *Deleted

## 2020-05-10 NOTE — Telephone Encounter (Signed)
Patient called and reported that Effexor was not helping at all with her ADHD symptoms.  She then stated that she tried her daughters Concerta  54mg  and it worked well for her.  She is requesting Concerta. She realizes you were trying to take her off Vyvance but she really would like another stimulant.  Please review.

## 2020-05-10 NOTE — Telephone Encounter (Signed)
Patient does not have ADHD diagnosis and she should not be using stimulants not prescribed for her , its a controlled substance . This was already discussed with her. Pls advise her that further med changes will be made after an evaluation.

## 2020-05-12 ENCOUNTER — Ambulatory Visit (INDEPENDENT_AMBULATORY_CARE_PROVIDER_SITE_OTHER): Admitting: Pulmonary Disease

## 2020-05-12 ENCOUNTER — Other Ambulatory Visit: Payer: Self-pay

## 2020-05-12 ENCOUNTER — Encounter: Payer: Self-pay | Admitting: Pulmonary Disease

## 2020-05-12 DIAGNOSIS — J449 Chronic obstructive pulmonary disease, unspecified: Secondary | ICD-10-CM

## 2020-05-12 DIAGNOSIS — A310 Pulmonary mycobacterial infection: Secondary | ICD-10-CM | POA: Diagnosis not present

## 2020-05-12 DIAGNOSIS — R918 Other nonspecific abnormal finding of lung field: Secondary | ICD-10-CM | POA: Diagnosis not present

## 2020-05-12 NOTE — Progress Notes (Signed)
Virtual Visit via Telephone Note  I connected with Bailey Flores on 05/12/20 at 12:00 PM EST by telephone and verified that I am speaking with the correct person using two identifiers.  Location: Patient: Home Provider: Office [facility] - [address], [place], [place], [place] [address]   I discussed the limitations, risks, security and privacy concerns of performing an evaluation and management service by telephone and the availability of in person appointments. I also discussed with the patient that there may be a patient responsible charge related to this service. The patient expressed understanding and agreed to proceed.  Patient consented to consult via telephone: Yes People present and their role in pt care: Pt     History of Present Illness:  59 year old female former smoker followed in our office for COPD and MAI  Past medical history: ADHD, bipolar, GERD, neuroendocrine carcinoma, expressive aphasia, constipation, anxiety Smoking history: Former smoker.  Quit 2014.  74-pack-year smoking history Maintenance: Trelegy Ellipta  Patient of Dr. Halford Chessman  Chief complaint: Review CT results  59 year old female former smoker found our office for COPD and MAI.  Patient was last seen by Dr. Earney Mallet in June/2021 patient is followed by infectious disease at [facility] by Dr. Ola Spurr.  She was started on azithromycin, rifampin and ethambutol in March/2021.  She was changed to Trelegy Ellipta.  She was encouraged to continue Singulair for management of her asthma.  CT chest done on 05/04/2020 showed a 9 mm nodule in left upper lobe new scan done [facility].  Per patient Dr. Halford Chessman has the CT images on a disc.  Patient also reporting that she continues to tolerate triple therapy for management of her MAI.  This is managed by [facility] Dr. Ola Spurr.  She has not had any hemoptysis.  She continues to cough up dark green phlegm.  In September/2021 her AFB  was negative.  Plan is for her to continue triple therapy MAI treatment through August/2022.  She has upcoming follow-up with [facility] in February/2022.  Patient now lives in [place], [place].  She has not yet established with a local pulmonary provider.  It would be over 1/2 hours for her to present to the [place] office in over 2 hours for her to present to the [place] office.  Besides baseline cough with discolored phlegm patient does not have any fevers, weight loss or hemoptysis.   Observations/Objective:  Pulmonary tests:   PFT 09/06/17 >> FEV1 1.41 (67%), FEV1% 62, TLC 3.22 (79%), DLCO 80%   Chest imaging:   CT chest lung cancer screening 05/09/19 >> atherosclerosis, mild/mod emphysema, bronchial wall thickening, multiple nodules up to 1.5 cm   CT chest 05/04/20 >> 9 mm nodule LUL new (scan done at [facility])    Micro data:   Sputum 06/05/19 >> Stenothrophomonas, MAI  Sputum 07/16/19 >> MAI  Sputum 09/17/19 >> MAI  Sleep tests:   HST 07/03/17 >> AHI 20.5, SpO2 low 61%  Social History   Tobacco Use  Smoking Status Former Smoker  . Packs/day: 2.00  . Years: 37.00  . Pack years: 74.00  . Types: Cigarettes  . Quit date: 07/19/2012  . Years since quitting: 7.8  Smokeless Tobacco Never Used   Immunization History  Administered Date(s) Administered  . Influenza,inj,Quad PF,6+ Mos 05/04/2017, 03/18/2018, 01/18/2019  . Influenza-Unspecified 01/18/2019  . Pneumococcal Conjugate-13 05/04/2017  . Pneumococcal-Unspecified 10/15/2018  . Tdap 01/18/2019  . 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 Ola Spurr today   Follow Up Instructions:  Return if symptoms worsen or fail to improve, for Follow up with Dr. Halford Chessman.   I discussed the assessment and treatment plan with the patient. The patient was provided an opportunity to ask questions and all were answered. The patient agreed with the plan and demonstrated an understanding of the instructions.   The patient was advised to call back or seek an in-person evaluation if the symptoms worsen or if the condition fails to improve as anticipated.  I provided 32 minutes of non-face-to-face time during this encounter.   Lauraine Rinne, NP

## 2020-05-12 NOTE — Assessment & Plan Note (Signed)
Established with [facility] [place] On triple therapy for MAI September/2021 AFB negative  Plan: Continue medications as managed by [facility] Keep follow-up with [facility] Note routed to Dr. Ola Spurr today

## 2020-05-12 NOTE — Assessment & Plan Note (Signed)
Plan: Continue Trelegy Ellipta Continue Singulair We will refer to [facility] for pulmonary provider moving forward

## 2020-05-12 NOTE — Progress Notes (Signed)
Reviewed and agree with assessment/plan.   Chesley Mires, MD [facility] 05/12/2020, 12:40 PM Pager:  [phone]

## 2020-05-12 NOTE — Patient Instructions (Signed)
You were seen today by Lauraine Rinne, NP  for:   1. Abnormal findings on diagnostic imaging of lung  - Ambulatory referral to Pulmonology  We will discussed her case with Dr. Halford Chessman  We will tentatively plan on a repeat CT in March/2022  We will refer you to [facility] for pulmonary follow-up >>> Bailey Roberts, MD >>> [phone]  2. Asthma-COPD overlap syndrome ([place])  - Ambulatory referral to Pulmonology  Trelegy Ellipta  >>> 1 puff daily in the morning >>>rinse mouth out after use  >>> This inhaler contains 3 medications that help manage her respiratory status, contact our office if you cannot afford this medication or unable to remain on this medication  Note your daily symptoms > remember "red flags" for COPD:   >>>Increase in cough >>>increase in sputum production >>>increase in shortness of breath or activity  intolerance.   If you notice these symptoms, please call the office to be seen.   Only use your albuterol as a rescue medication to be used if you can't catch your breath by resting or doing a relaxed purse lip breathing pattern.  - The less you use it, the better it will work when you need it. - Ok to use up to 2 puffs  every 4 hours if you must but call for immediate appointment if use goes up over your usual need - Don't leave home without it !!  (think of it like the spare tire for your car)   Continue Singulair  3. 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 Ambulatory referral to Pulmonology    Referral Priority:   Routine    Referral Type:   Consultation    Referral Reason:   Specialty Services Required    Requested Specialty:   Pulmonary Disease    Number of Visits Requested:   1   Orders Placed This Encounter  Procedures  . Ambulatory  referral to Pulmonology   No orders of the defined types were placed in this encounter.   Follow Up:    Return if symptoms worsen or fail to improve, for Follow up with Dr. Halford Chessman.   Notification of test results are managed in the following manner: If there are  any recommendations or changes to the  plan of care discussed in office today,  we will contact you and let you know what they are. If you do not hear from [place], then your results are normal and you can view them through your  MyChart account , or a letter will be sent to you. Thank you again for trusting [place] with your care  - Thank you, [facility]    It is flu season:   >>> Best ways to protect herself from the flu: Receive the yearly flu vaccine, practice good hand hygiene washing with soap and also using hand sanitizer when available, eat a nutritious meals, get adequate rest, hydrate appropriately       Please contact the office if your symptoms worsen or you have concerns that you are not improving.   Thank you for choosing [facility] for your healthcare, and for allowing [place] to partner with you on your healthcare journey. I am thankful to be able to provide care to you today.   Bailey Quaker FNP-C

## 2020-05-18 ENCOUNTER — Other Ambulatory Visit: Payer: Self-pay | Admitting: Pulmonary Disease

## 2020-05-18 MED ORDER — ALBUTEROL SULFATE HFA 108 (90 BASE) MCG/ACT IN AERS: 1.0000 | INHALATION_SPRAY | RESPIRATORY_TRACT | 3 refills | Status: AC | PRN

## 2020-05-20 ENCOUNTER — Telehealth (INDEPENDENT_AMBULATORY_CARE_PROVIDER_SITE_OTHER): Admitting: Psychiatry

## 2020-05-20 ENCOUNTER — Other Ambulatory Visit: Payer: Self-pay

## 2020-05-20 ENCOUNTER — Encounter: Payer: Self-pay | Admitting: Psychiatry

## 2020-05-20 DIAGNOSIS — F411 Generalized anxiety disorder: Secondary | ICD-10-CM | POA: Diagnosis not present

## 2020-05-20 DIAGNOSIS — Z8659 Personal history of other mental and behavioral disorders: Secondary | ICD-10-CM

## 2020-05-20 DIAGNOSIS — F3132 Bipolar disorder, current episode depressed, moderate: Secondary | ICD-10-CM | POA: Diagnosis not present

## 2020-05-20 MED ORDER — VENLAFAXINE HCL ER 150 MG PO CP24: 150.0000 mg | ORAL_CAPSULE | Freq: Every day | ORAL | 0 refills | Status: DC

## 2020-05-20 NOTE — Progress Notes (Signed)
Virtual Visit via Video Note  I connected with Bailey Flores on 05/20/20 at  9:00 AM EST by a video enabled telemedicine application and verified that I am speaking with the correct person using two identifiers.  Location Provider Location : [facility] Patient Location : Home  Participants: Patient , Provider   I discussed the limitations of evaluation and management by telemedicine and the availability of in person appointments. The patient expressed understanding and agreed to proceed.   I discussed the assessment and treatment plan with the patient. The patient was provided an opportunity to ask questions and all were answered. The patient agreed with the plan and demonstrated an understanding of the instructions.   The patient was advised to call back or seek an in-person evaluation if the symptoms worsen or if the condition fails to improve as anticipated.  Video connection was lost at less than 50% of the duration of the visit, at which time the remainder of the visit was completed through audio only  [facility] MD OP Progress Note  05/20/2020 5:51 PM Bailey Flores  MRN:  [id number]  Chief Complaint:  Chief Complaint    Follow-up     HPI: Bailey Flores is a 59 year old female, lives in [place], has a history of bipolar disorder, ADHD per history, cognitive disorder, COPD, hypertension, chronic pain was evaluated by telemedicine today.  Patient today reports she continues to struggle with lack of motivation, depressive symptoms.  She reports her husband is currently struggling with physical problems.  That does make her more depressed.  She hence has been unable to do the chores around the house, has laundry piled up, feels scatterbrained, and so on.  Patient reports she also feels this way due to the holidays since it brings back memories memories of her childhood when she used to spent time with her family and cooked traditional food and so on.  She however did get  to spend time with her children.  That did help to some extent.  Patient continues to believe she has ADHD even when she had testing which was done recently which showed that she does not have ADHD.  She reports she does not believe that testing report.  She recently borrowed Concerta from her daughter and it made her feel better and that makes her believe that she does certainly have ADHD symptoms.  Patient reports sleep is good.  She denies any suicidality, homicidality or perceptual disturbances.  Patient denies any other concerns today.  Visit Diagnosis:    ICD-10-CM   1. Bipolar 1 disorder, depressed, moderate (HCC)  F31.32 venlafaxine XR (EFFEXOR XR) 150 MG 24 hr capsule  2. GAD (generalized anxiety disorder)  F41.1   3. History of ADHD  Z86.59     Past Psychiatric History: I have reviewed past psychiatric history from my progress note on 08/10/2017  Past Medical History:  Past Medical History:  Diagnosis Date  . Arthritis    hands  . Asthma   . Barrett's esophagus   . Cancer [facility])    neuroendocrine ca  . COPD (chronic obstructive pulmonary disease) ([place])   . Depression   . GERD (gastroesophageal reflux disease)   . Hypertension   . Motion sickness    reading in car  . 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 Orthodontics    permanent retainers, top and bottom  . Pneumonia 2017, 2020    Past Surgical History:  Procedure Laterality Date  . ABDOMINAL HYSTERECTOMY    .  ANTERIOR CERVICAL DECOMPRESSION/DISCECTOMY FUSION 4 LEVELS N/A 04/09/2019   Procedure: ANTERIOR CERVICAL DECOMPRESSION/DISCECTOMY FUSION 5 LEVELS C3-T1;  Surgeon: Meade Maw, MD;  Location: [facility] OR[facility];  Service: Neurosurgery;  Laterality: N/A;  . BIOPSY  03/17/2019   Procedure: BIOPSY;  Surgeon: [name], MD;  Location: [place];  Service: Endoscopy;;  . BOWEL RESECTION  07/19/2012  . CESAREAN SECTION     x3  . COLONOSCOPY WITH PROPOFOL N/A 12/02/2019   Procedure: COLONOSCOPY  WITH PROPOFOL;  Surgeon: [name], MD;  [facility] ENDOSCOPY;  Service: Endoscopy;  Laterality: N/A;  . ESOPHAGOGASTRODUODENOSCOPY (EGD) WITH PROPOFOL N/A 03/05/2017   Procedure: ESOPHAGOGASTRODUODENOSCOPY (EGD) WITH PROPOFOL;  Surgeon: [name], MD;  Location: [place];  Service: Endoscopy;  Laterality: N/A;  . ESOPHAGOGASTRODUODENOSCOPY (EGD) WITH PROPOFOL N/A 03/30/2017   REPEAT IN 03/2019  . ESOPHAGOGASTRODUODENOSCOPY (EGD) WITH PROPOFOL N/A 03/17/2019   Procedure: ESOPHAGOGASTRODUODENOSCOPY (EGD) WITH PROPOFOL;  Surgeon: [name], MD;  Location: [place];  Service: Endoscopy;  Laterality: N/A;  . HAND SURGERY Right   . TOTAL SHOULDER ARTHROPLASTY Right 01/05/2020   Procedure: Right total shoulder arthroplasty, biceps tenodesis;  Surgeon: Leim Fabry, MD;  Location: [facility] ORS;  Service: Orthopedics;  Laterality: Right;  . TRANSFORAMINAL LUMBAR INTERBODY FUSION (TLIF) WITH PEDICLE SCREW FIXATION 1 LEVEL N/A 12/05/2017   Procedure: TRANSFORAMINAL LUMBAR INTERBODY FUSION (TLIF) WITH PEDICLE SCREW FIXATION 1 LEVEL-L4-5;  Surgeon: Meade Maw, MD;  Location: [facility] ORS;  Service: Neurosurgery;  Laterality: N/A;    Family Psychiatric History: I have reviewed family psychiatric history from my progress note on 08/10/2017  Family History:  Family History  Problem Relation Age of Onset  . Breast cancer Cousin        mat cousin    Social History: Reviewed social history from my progress note on 08/10/2017 Social History   Socioeconomic History  . Marital status: Married    Spouse name: Not on file  . Number of children: Not on file  . Years of education: Not on file  . Highest education level: Not on file  Occupational History  . Not on file  Tobacco Use  . Smoking status: Former Smoker    Packs/day: 2.00    Years: 37.00    Pack years: 74.00    Types: Cigarettes    Quit date: 07/19/2012    Years since quitting: 7.8  . Smokeless tobacco: Never  Used  Vaping Use  . Vaping Use: Never used  Substance and Sexual Activity  . Alcohol use: Yes    Comment: 2 drinks/mo  . Drug use: No  . Sexual activity: Yes  Other Topics Concern  . Not on file  Social History Narrative  . Not on file   Social Determinants of Health   Financial Resource Strain: Not on file  Food Insecurity: Not on file  Transportation Needs: Not on file  Physical Activity: Not on file  Stress: Not on file  Social Connections: Not on file    Allergies: No Known Allergies  Metabolic Disorder Labs: Lab Results  Component Value Date   HGBA1C 5.9 (H) 06/13/2017   MPG 122.63 06/13/2017   Lab Results  Component Value Date   PROLACTIN 4.6 (L) 06/13/2017   Lab Results  Component Value Date   CHOL 183 06/13/2017   TRIG 80 06/13/2017   HDL 92 06/13/2017   CHOLHDL 2.0 06/13/2017   VLDL 16 06/13/2017   LDLCALC 75 06/13/2017   Lab Results  Component Value Date  TSH 1.205 06/13/2017    Therapeutic Level Labs: No results found for: LITHIUM No results found for: VALPROATE No components found for:  CBMZ  Current Medications: Current Outpatient Medications  Medication Sig Dispense Refill  . ethambutol (MYAMBUTOL) 400 MG tablet Take by mouth.    . rifampin (RIFADIN) 300 MG capsule Take by mouth.    . venlafaxine XR (EFFEXOR XR) 150 MG 24 hr capsule Take 1 capsule (150 mg total) by mouth daily with breakfast. 90 capsule 0  . acetaminophen (TYLENOL) 650 MG CR tablet Take 1,950 mg by mouth every 8 (eight) hours.     Marland Kitchen albuterol (VENTOLIN HFA) 108 (90 Base) MCG/ACT inhaler Inhale 1-2 puffs into the lungs every 4 (four) hours as needed for wheezing or shortness of breath. 3 each 3  . amoxicillin (AMOXIL) 500 MG capsule Take 1,000 mg by mouth 2 (two) times daily.    Marland Kitchen amoxicillin-clavulanate (AUGMENTIN) 875-125 MG tablet Take 1 tablet by mouth 2 (two) times daily. (Patient not taking: Reported on 03/17/2020)    . aspirin 325 MG tablet Take by mouth.    Marland Kitchen  azithromycin (ZITHROMAX) 500 MG tablet Take 500 mg by mouth 3 (three) times a week.     Marland Kitchen buPROPion (WELLBUTRIN SR) 200 MG 12 hr tablet TAKE 1 TABLET TWICE A DAY (Patient taking differently: Take 200 mg by mouth 2 (two) times daily. ) 180 tablet 3  . celecoxib (CELEBREX) 200 MG capsule Take 400 mg by mouth 2 (two) times daily.    . Cholecalciferol (VITAMIN D3) 25 MCG (1000 UT) CAPS Take 1,000 Units by mouth at bedtime.     . docusate sodium (COLACE) 100 MG capsule Take 200 mg by mouth daily.    Marland Kitchen ethambutol (MYAMBUTOL) 400 MG tablet Take 1,400 mg by mouth 3 (three) times a week.     . Fluticasone-Umeclidin-Vilant (TRELEGY ELLIPTA) 100-62.5-25 MCG/INH AEPB Inhale 1 puff into the lungs daily. 180 each 3  . gabapentin (NEURONTIN) 300 MG capsule Take 600 mg by mouth 3 (three) times daily.     Marland Kitchen ibuprofen (ADVIL) 200 MG tablet Take by mouth.    Marland Kitchen ipratropium-albuterol (DUONEB) 0.5-2.5 (3) MG/3ML SOLN Inhale 3 mLs into the lungs every 4 (four) hours as needed (Only uses with cold symptoms).     Marland Kitchen lamoTRIgine (LAMICTAL) 200 MG tablet TAKE 1 TABLET TWICE A DAY (DOSE INCREASE) (Patient taking differently: Take 200 mg by mouth 2 (two) times daily. ) 180 tablet 3  . methocarbamol (ROBAXIN) 500 MG tablet Take 2 tablets (1,000 mg total) by mouth every 6 (six) hours. (Patient taking differently: Take 1,000 mg by mouth 3 (three) times daily. ) 80 tablet 0  . Milk Thistle 150 MG CAPS Take 300 mg by mouth daily.    . montelukast (SINGULAIR) 10 MG tablet Take 10 mg by mouth at bedtime.     Marland Kitchen omeprazole (PRILOSEC) 40 MG capsule Take 40 mg by mouth daily.     Marland Kitchen oxyCODONE (OXY IR/ROXICODONE) 5 MG immediate release tablet Take 1-2 tablets (5-10 mg total) by mouth every 4 (four) hours as needed for moderate pain (pain score 4-6). (Patient not taking: Reported on 03/17/2020) 30 tablet 0  . oxyCODONE-acetaminophen (PERCOCET) 10-325 MG tablet Take 1 tablet by mouth every 6 (six) hours as needed.    . propranolol (INDERAL) 10  MG tablet TAKE 1 TABLET(10 MG) BY MOUTH THREE TIMES DAILY AS NEEDED FOR SEVERE ANXIETY OR AGITATION (Patient taking differently: Take 10 mg by mouth 3 (three)  times daily as needed (anxiety). ) 270 tablet 0  . rifampin (RIFADIN) 300 MG capsule Take 600 mg by mouth 3 (three) times a week.     . traZODone (DESYREL) 50 MG tablet trazodone 50 mg tablet    . valACYclovir (VALTREX) 1000 MG tablet Take 2,000 mg by mouth 2 (two) times daily as needed (herpes simplex).     . valsartan-hydrochlorothiazide (DIOVAN-HCT) 160-12.5 MG tablet Take 1 tablet by mouth at bedtime.      No current facility-administered medications for this visit.     Musculoskeletal: Strength & Muscle Tone: UTA Gait & Station: UTA Patient leans: N/A  Psychiatric Specialty Exam: Review of Systems  Psychiatric/Behavioral: Positive for decreased concentration and dysphoric mood. The patient is nervous/anxious.   All other systems reviewed and are negative.   There were no vitals taken for this visit.There is no height or weight on file to calculate BMI.  General Appearance: UTA  Eye Contact:  UTA  Speech:  Clear and Coherent  Volume:  Normal  Mood:  Anxious and Depressed  Affect:  UTA  Thought Process:  Goal Directed and Descriptions of Associations: Intact  Orientation:  Full (Time, Place, and Person)  Thought Content: Logical   Suicidal Thoughts:  No  Homicidal Thoughts:  No  Memory:  Immediate;   Fair Recent;   Fair Remote;   Fair  Judgement:  Fair  Insight:  Fair  Psychomotor Activity:  UTA  Concentration:  Concentration: Fair and Attention Span: Fair  Recall:  [employer] of Knowledge: Fair  Language: Fair  Akathisia:  No  Handed:  Right  AIMS (if indicated): UTA  Assets:  Communication Skills Desire for Improvement Housing Social Support  ADL's:  Intact  Cognition: WNL  Sleep:  Fair   Screenings: PHQ2-9   Flowsheet Row Pulmonary Rehab from [facility]/25/2017 [facility]  PHQ-2 Total  Score 4  PHQ-9 Total Score 19       Assessment and Plan: Bailey Flores is a 59 year old Hispanic female who has a history of bipolar disorder, COPD, GERD was evaluated by telemedicine today.  Patient is currently struggling with depressive symptoms, decreased concentration, lack of motivation, does have psychosocial stressors of her husband's health issues and it being the holidays.  Patient will benefit from the following plan.  Plan Bipolar disorder-unstable Continue Lamictal 200 mg p.o. twice daily Wellbutrin SR 200 mg p.o. twice daily Trazodone 25 to 50 mg p.o. nightly as needed Propranolol 10 mg p.o. 3 times daily as needed for severe anxiety/agitation Increase venlafaxine to extended release 150 mg p.o. daily We will avoid medications in the class of second generation antipsychotics like Rexulti, Abilify due to drug to drug interaction with her rifampin which she is currently on.  This was discussed with patient. Patient advised to restart psychotherapy sessions on a more intensive basis.  She will get in touch with her therapist Ms. Juanito Doom.  GAD-some progress Increase venlafaxine extended release 150 mg p.o. daily Continue CBT.  Discussed with patient that recent neuropsychological testing showed that she does not have ADHD and hence she will not be restarted on a stimulant medication.  However if she continues to believe that she does have ADHD and wants to be back on stimulant medication would advise her to establish care with ADHD specialist-provided information for [place] attention specialist to get repeat testing. Patient also advised not to take medications that belong to family members and not to make changes with her  medications without consulting with her providers.  Follow-up in clinic in 3 to 4 weeks or sooner if needed.  I have spent atleast 20 minutes non face to face with patient today. More than 50 % of the time was spent for preparing to see the  patient ( e.g., review of test, records ), ordering medications and test ,psychoeducation and supportive psychotherapy and care coordination,as well as documenting clinical information in electronic health record. This note was generated in part or whole with voice recognition software. Voice recognition is usually quite accurate but there are transcription errors that can and very often do occur. I apologize for any typographical errors that were not detected and corrected.        [name], MD 05/20/2020, 5:51 PM

## 2020-05-20 NOTE — Patient Instructions (Signed)
Serotonin Syndrome °Serotonin is a chemical in your body (neurotransmitter) that helps to control several functions, such as: °· Brain and nerve cell function. °· Mood and emotions. °· Memory. °· Eating. °· Sleeping. °· Sexual activity. °· Stress response. °Having too much serotonin in your body can cause serotonin syndrome. This condition can be harmful to your brain and nerve cells. This can be a life-threatening condition. °What are the causes? °This condition may be caused by taking medicines or drugs that increase the level of serotonin in your body, such as: °· Antidepressant medicines. °· Migraine medicines. °· Certain pain medicines. °· Certain drugs, including ecstasy, LSD, cocaine, and amphetamines. °· Over-the-counter cough or cold medicines that contain dextromethorphan. °· Certain herbal supplements, including St. John's wort, ginseng, and nutmeg. °This condition usually occurs when you take these medicines or drugs in combination, but it can also happen with a high dose of a single medicine or drug. °What increases the risk? °You are more likely to develop this condition if: °· You just started taking a medicine or drug that increases the level of serotonin in the body. °· You recently increased the dose of a medicine or drug that increases the level of serotonin in the body. °· You take more than one medicine or drug that increases the level of serotonin in the body. °What are the signs or symptoms? °Symptoms of this condition usually start within several hours of taking a medicine or drug. Symptoms may be mild or severe. Mild symptoms include: °· Sweating. °· Restlessness or agitation. °· Muscle twitching or stiffness. °· Rapid heart rate. °· Nausea and vomiting. °· Diarrhea. °· Headache. °· Shivering or goose bumps. °· Confusion. °Severe symptoms include: °· Irregular heartbeat. °· Seizures. °· Loss of consciousness. °· High fever. °How is this diagnosed? °This condition may be diagnosed based  on: °· Your medical history.  °· A physical exam. °· Your prior use of drugs and medicines. °· Blood or urine tests. These may be used to rule out other causes of your symptoms. °How is this treated? °The treatment for this condition depends on the severity of your symptoms. °· For mild cases, stopping the medicine or drug that caused your condition is usually all that is needed. °· For moderate to severe cases, treatment in a hospital may be needed to prevent or manage life-threatening symptoms. This may include medicines to control your symptoms, IV fluids, interventions to support your breathing, and treatments to control your body temperature. °Follow these instructions at home: °Medicines ° °· Take over-the-counter and prescription medicines only as told by your health care provider. This is important. °· Check with your health care provider before you start taking any new prescriptions, over-the-counter medicines, herbs, or supplements. °· Avoid combining any medicines that can cause this condition to occur. °Lifestyle ° °· Maintain a healthy lifestyle. °? Eat a healthy diet that includes plenty of vegetables, fruits, whole grains, low-fat dairy products, and lean protein. Do not eat a lot of foods that are high in fat, added sugars, or salt. °? Get the right amount and quality of sleep. Most adults need 7-9 hours of sleep each night. °? Make time to exercise, even if it is only for short periods of time. Most adults should exercise for at least 150 minutes each week. °? Do not drink alcohol. °? Do not use illegal drugs, and do not take medicines for reasons other than they are prescribed. °General instructions °· Do not use any products that contain nicotine   or tobacco, such as cigarettes and e-cigarettes. If you need help quitting, ask your health care provider. °· Keep all follow-up visits as told by your health care provider. This is important. °Contact a health care provider if: °· Your symptoms do not  improve or they get worse. °Get help right away if you: °· Have worsening confusion, severe headache, chest pain, high fever, seizures, or loss of consciousness. °· Experience serious side effects of medicine, such as swelling of your face, lips, tongue, or throat. °· Have serious thoughts about hurting yourself or others. °These symptoms may represent a serious problem that is an emergency. Do not wait to see if the symptoms will go away. Get medical help right away. Call your local emergency services (911 in the [place] Do not drive yourself to the hospital. °If you ever feel like you may hurt yourself or others, or have thoughts about taking your own life, get help right away. You can go to your nearest emergency department or call: °· Your local emergency services (911 in the [place] °· ·A suicide crisis helpline, such as the National Suicide Prevention Lifeline at 1-800-273-8255. This is open 24 hours a day. °Summary °· Serotonin is a brain chemical that helps to regulate the nervous system. High levels of serotonin in the body can cause serotonin syndrome, which is a very dangerous condition. °· This condition may be caused by taking medicines or drugs that increase the level of serotonin in your body. °· Treatment depends on the severity of your symptoms. For mild cases, stopping the medicine or drug that caused your condition is usually all that is needed. °· Check with your health care provider before you start taking any new prescriptions, over-the-counter medicines, herbs, or supplements. °This information is not intended to replace advice given to you by your health care provider. Make sure you discuss any questions you have with your health care provider. °Document Revised: 06/15/2017 Document Reviewed: 06/15/2017 °Elsevier Patient Education © 2020 [employer] ° °

## 2020-05-24 ENCOUNTER — Telehealth: Payer: Self-pay | Admitting: Pulmonary Disease

## 2020-05-24 NOTE — Telephone Encounter (Signed)
Pt aware PCC's are going to be working on her referral again  Nothing further needed

## 2020-05-24 NOTE — Telephone Encounter (Signed)
The only referral that I sent was for her to have the Lung Cancer Screening CT and it was faxed to the hospital and we have received. I will refax the referral to Dr. Evalyn Casco

## 2020-05-24 NOTE — Telephone Encounter (Signed)
I called and spoke with Star with Dr. Terrall Laity office and she confirmed that they did get the referral and the records that I faxed today

## 2020-05-24 NOTE — Telephone Encounter (Signed)
Spoke with patient, she stated that Dr. Eddie North office never received the referral. She is requesting to have the referral resent, attn Joni Reining.   PCCs, can you please help? Thanks!

## 2020-05-31 ENCOUNTER — Ambulatory Visit (INDEPENDENT_AMBULATORY_CARE_PROVIDER_SITE_OTHER): Admitting: Licensed Clinical Social Worker

## 2020-05-31 ENCOUNTER — Encounter: Payer: Self-pay | Admitting: Licensed Clinical Social Worker

## 2020-05-31 ENCOUNTER — Other Ambulatory Visit: Payer: Self-pay

## 2020-05-31 DIAGNOSIS — F411 Generalized anxiety disorder: Secondary | ICD-10-CM

## 2020-05-31 DIAGNOSIS — F3176 Bipolar disorder, in full remission, most recent episode depressed: Secondary | ICD-10-CM | POA: Diagnosis not present

## 2020-05-31 NOTE — Progress Notes (Signed)
Virtual Visit via Video Note  I connected with Bailey Flores on 05/31/20 at  2:00 PM EST by a video enabled telemedicine application and verified that I am speaking with the correct person using two identifiers. Patient's camera appeared to be on, however I could not see patient due to distorted and discolored image throughout session. Patient confirmed she could both see and hear me.  Participating Parties Patient Provider  Location: Patient: Home Provider: Home Office   I discussed the limitations of evaluation and management by telemedicine and the availability of in person appointments. The patient expressed understanding and agreed to proceed.  THERAPY PROGRESS NOTE  Session Time: 60 Minutes  Participation Level: Active  Behavioral Response: AlertAnxious and Depressed  Type of Therapy: Individual Therapy  Treatment Goals addressed: Anxiety and Coping  Interventions: CBT  Summary: Bailey Flores is a 59 y.o. female who presents with anxiety and some depression sxs. Pt reported "things have been pretty busy" since last session and voiced concerns related to her and spouse's health. Pt explained she had a recent CAT scan and they found a nodule on her lung. Pt reported her doctor is going to continue to monitor and then repeat another scan in 3 months. Pt reported her husband recently tested positive for COVID, but was asymptomatic. Pt reported these "health scares" have created some anxiety and reflection on "my own mortality". Pt reported a noticeable decrease in motivation and energy levels. Pt reported missing family members and traditions from the past and acknowledged feelings of sadness. Pt identified relationship dynamics with adult children and family members. Pt identified boundaries she has set in place and how this has helped these relationships.   Suicidal/Homicidal: No  Therapist Response: Therapist met with patient for follow after completing CCA.  Therapist and patient completed treatment plan and goals around reducing anxiety and restoring energy and motivation. Therapist and patient explored stressors and attempts to cope as well as family dynamics. Therapist validated and normalized patient experience/feelings. Pt was receptive.  Plan: Return again in 2 weeks.  Diagnosis: Axis I: Bipolar, Depressed and Generalized Anxiety Disorder    Axis II: N/A  Follow Up Instructions:  I discussed the treatment plan with the patient. The patient was provided an opportunity to ask questions and all were answered. The patient agreed with the plan and demonstrated an understanding of the instructions.   The patient was advised to call back or seek an in-person evaluation if the symptoms worsen or if the condition fails to improve as anticipated.  I provided 60 minutes of non-face-to-face time during this encounter.    P O'Reilly, LCSW, LCAS   

## 2020-06-14 ENCOUNTER — Encounter: Payer: Self-pay | Admitting: Licensed Clinical Social Worker

## 2020-06-14 ENCOUNTER — Other Ambulatory Visit: Payer: Self-pay

## 2020-06-14 ENCOUNTER — Telehealth (INDEPENDENT_AMBULATORY_CARE_PROVIDER_SITE_OTHER): Admitting: Licensed Clinical Social Worker

## 2020-06-14 DIAGNOSIS — F3131 Bipolar disorder, current episode depressed, mild: Secondary | ICD-10-CM | POA: Diagnosis not present

## 2020-06-14 DIAGNOSIS — F411 Generalized anxiety disorder: Secondary | ICD-10-CM

## 2020-06-14 NOTE — Progress Notes (Signed)
Virtual Visit via Video Note  I connected with [name] on 06/14/20 at  2:00 PM EST by a video enabled telemedicine application and verified that I am speaking with the correct person using two identifiers.  Participating Parties Patient Provider  Location: Patient: Home Provider: Home Office   I discussed the limitations of evaluation and management by telemedicine and the availability of in person appointments. The patient expressed understanding and agreed to proceed.  THERAPY PROGRESS NOTE  Session Time: 15 Minutes  Participation Level: Active  Behavioral Response: Casual and Well GroomedAlertDepressed  Type of Therapy: Individual Therapy  Treatment Goals addressed: Coping  Interventions: CBT  Summary: Bailey Flores is a 60 y.o. female who presents with with anxiety and depression sxs. Pt reported "trying to push myself out of the funk I am in". Pt reported she has been coping by talking to her support systems and at their suggestion would like to start engaging in some type of craft/hobby. Pt reported feeling "not even down.just nil.vegetating is the word I would use". Pt reported this has led to lack of desire for sex and worries how this may affect husband's feelings. Pt reported understanding of the 5 love languages and was able to identify where they match and mismatch. Pt reported "I still struggle with the past and it can get in the way of the present". Pt identified having regrets about her life decisions in the past and how it affected her children. Pt became tearful recounting moments/experiences that she believes led to "disastrous" outcomes in terms of children's mental well-being. Pt identified ways she copes with depression and feelings of guilt.  Suicidal/Homicidal: No  Therapist Response: Therapist met with patient for follow up. Therapist and patient processed thoughts, feelings and reflections on current sxs and past experiences. Therapist  and patient discussed love languages, setting boundaries, and use of coping skills.  Plan: Return again in 1 week.  Diagnosis: Axis I: Bipolar, Depressed and Generalized Anxiety Disorder    Axis II: N/A  [name], LCSW, LCAS 06/14/2020

## 2020-06-16 ENCOUNTER — Other Ambulatory Visit: Payer: Self-pay

## 2020-06-16 ENCOUNTER — Encounter: Payer: Self-pay | Admitting: Psychiatry

## 2020-06-16 ENCOUNTER — Telehealth (INDEPENDENT_AMBULATORY_CARE_PROVIDER_SITE_OTHER): Admitting: Psychiatry

## 2020-06-16 DIAGNOSIS — Z8659 Personal history of other mental and behavioral disorders: Secondary | ICD-10-CM | POA: Diagnosis not present

## 2020-06-16 DIAGNOSIS — F411 Generalized anxiety disorder: Secondary | ICD-10-CM

## 2020-06-16 DIAGNOSIS — F3131 Bipolar disorder, current episode depressed, mild: Secondary | ICD-10-CM | POA: Diagnosis not present

## 2020-06-16 NOTE — Progress Notes (Signed)
Virtual Visit via Video Note  I connected with [place] on 06/16/20 at  9:00 AM EST by a video enabled telemedicine application and verified that I am speaking with the correct person using two identifiers.  Location Provider Location : [facility] Patient Location : Home  Participants: Patient ,Spouse, Provider   I discussed the limitations of evaluation and management by telemedicine and the availability of in person appointments. The patient expressed understanding and agreed to proceed.    I discussed the assessment and treatment plan with the patient. The patient was provided an opportunity to ask questions and all were answered. The patient agreed with the plan and demonstrated an understanding of the instructions.   The patient was advised to call back or seek an in-person evaluation if the symptoms worsen or if the condition fails to improve as anticipated.  Oil Trough MD OP Progress Note  06/16/2020 11:00 AM Shelsea Hangartner  MRN:  [id number]  Chief Complaint:  Chief Complaint    Follow-up     HPI: Bailey Flores is a 60 year old female, lives in [place], has a history of bipolar disorder, ADHD per history, cognitive disorder, COPD, hypertension, chronic pain was evaluated by telemedicine today.  Patient today reports she went through an episode of severe depressive symptoms beginning of January for around 2 to 3 weeks.  She reports she had lack of motivation, sadness, could not do her laundry, barely cooked, rarely took a shower and so on.  She however reports she was finally able to come out of it and is currently making progress.  She reports she continues to feel sad however she is making an effort to do things and that has been working well.  She denies any suicidality, homicidality or perceptual disturbances.  She is compliant on all her medications.  The venlafaxine has been beneficial and denies side effects to it.  She denies sleep problems at this  time.  She reports after her last visit she started working on her sleep hygiene and that has helped.  She does not take the trazodone at this time.  She reports she has been working with her therapist and has weekly appointments scheduled.  That has been helpful.  She reports she and her husband both probably had COVID-19 infection however they are asymptomatic.  She reports her husband had testing done beginning of January and tested positive prior to procedure however he did not show any symptoms.  Even though they live in the same house and she had close contact with him she did not develop any symptoms either.  Patient denies any other concerns.  Visit Diagnosis:    ICD-10-CM   1. Bipolar disorder, current episode depressed, mild ([place])  F31.31   2. GAD (generalized anxiety disorder)  F41.1   3. History of ADHD  Z86.59     Past Psychiatric History: I have reviewed past psychiatric history from my progress note on 08/10/2017.  Past Medical History:  Past Medical History:  Diagnosis Date  . Arthritis    hands  . Asthma   . Barrett's esophagus   . Cancer [facility])    neuroendocrine ca  . COPD (chronic obstructive pulmonary disease) ([place])   . Depression   . GERD (gastroesophageal reflux disease)   . Hypertension   . Motion sickness    reading in car  . 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 Orthodontics    permanent retainers, top and bottom  . Pneumonia 2017, 2020    Past  Surgical History:  Procedure Laterality Date  . ABDOMINAL HYSTERECTOMY    . ANTERIOR CERVICAL DECOMPRESSION/DISCECTOMY FUSION 4 LEVELS N/A 04/09/2019   Procedure: ANTERIOR CERVICAL DECOMPRESSION/DISCECTOMY FUSION 5 LEVELS C3-T1;  Surgeon: Meade Maw, MD;  Location: [facility] ORS;  Service: Neurosurgery;  Laterality: N/A;  . BIOPSY  03/17/2019   Procedure: BIOPSY;  Surgeon: [name], MD;  Location: [place];  Service: Endoscopy;;  . BOWEL RESECTION  07/19/2012  . CESAREAN SECTION      x3  . COLONOSCOPY WITH PROPOFOL N/A 12/02/2019   Procedure: COLONOSCOPY WITH PROPOFOL;  Surgeon: [name], MD;  Location: [facility] ENDOSCOPY;  Service: Endoscopy;  Laterality: N/A;  . ESOPHAGOGASTRODUODENOSCOPY (EGD) WITH PROPOFOL N/A 03/05/2017   Procedure: ESOPHAGOGASTRODUODENOSCOPY (EGD) WITH PROPOFOL;  Surgeon: [name], MD;  Location: [place];  Service: Endoscopy;  Laterality: N/A;  . ESOPHAGOGASTRODUODENOSCOPY (EGD) WITH PROPOFOL N/A 03/30/2017   REPEAT IN 03/2019  . ESOPHAGOGASTRODUODENOSCOPY (EGD) WITH PROPOFOL N/A 03/17/2019   Procedure: ESOPHAGOGASTRODUODENOSCOPY (EGD) WITH PROPOFOL;  Surgeon: [name], MD;  Location: [facility];  Service: Endoscopy;  Laterality: N/A;  . HAND SURGERY Right   . TOTAL SHOULDER ARTHROPLASTY Right 01/05/2020   Procedure: Right total shoulder arthroplasty, biceps tenodesis;  Surgeon: Leim Fabry, MD;  Location: [facility] ORS;  Service: Orthopedics;  Laterality: Right;  . TRANSFORAMINAL LUMBAR INTERBODY FUSION (TLIF) WITH PEDICLE SCREW FIXATION 1 LEVEL N/A 12/05/2017   Procedure: TRANSFORAMINAL LUMBAR INTERBODY FUSION (TLIF) WITH PEDICLE SCREW FIXATION 1 LEVEL-L4-5;  Surgeon: Meade Maw, MD;  Location: [facility] ORS;  Service: Neurosurgery;  Laterality: N/A;    Family Psychiatric History: I have reviewed family psychiatric history from my progress note from 08/10/2017.  Family History:  Family History  Problem Relation Age of Onset  . Breast cancer Cousin        mat cousin    Social History: Reviewed social history from my progress note from 08/11/2027. Social History   Socioeconomic History  . Marital status: Married    Spouse name: Not on file  . Number of children: Not on file  . Years of education: Not on file  . Highest education level: Not on file  Occupational History  . Not on file  Tobacco Use  . Smoking status: Former Smoker    Packs/day: 2.00    Years: 37.00    Pack years: 74.00    Types: Cigarettes     Quit date: 07/19/2012    Years since quitting: 7.9  . Smokeless tobacco: Never Used  Vaping Use  . Vaping Use: Never used  Substance and Sexual Activity  . Alcohol use: Yes    Comment: 2 drinks/mo  . Drug use: No  . Sexual activity: Yes  Other Topics Concern  . Not on file  Social History Narrative  . Not on file   Social Determinants of Health   Financial Resource Strain: Not on file  Food Insecurity: Not on file  Transportation Needs: Not on file  Physical Activity: Not on file  Stress: Not on file  Social Connections: Not on file    Allergies: No Known Allergies  Metabolic Disorder Labs: Lab Results  Component Value Date   HGBA1C 5.9 (H) 06/13/2017   MPG 122.63 06/13/2017   Lab Results  Component Value Date   PROLACTIN 4.6 (L) 06/13/2017   Lab Results  Component Value Date   CHOL 183 06/13/2017   TRIG 80 06/13/2017   HDL 92 06/13/2017   CHOLHDL 2.0 06/13/2017   VLDL 16 06/13/2017  [place] 75 06/13/2017   Lab Results  Component Value Date   TSH 1.205 06/13/2017    Therapeutic Level Labs: No results found for: LITHIUM No results found for: VALPROATE No components found for:  CBMZ  Current Medications: Current Outpatient Medications  Medication Sig Dispense Refill  . acetaminophen (TYLENOL) 650 MG CR tablet Take 1,950 mg by mouth every 8 (eight) hours.     Marland Kitchen albuterol (VENTOLIN HFA) 108 (90 Base) MCG/ACT inhaler Inhale 1-2 puffs into the lungs every 4 (four) hours as needed for wheezing or shortness of breath. 3 each 3  . amoxicillin (AMOXIL) 500 MG capsule Take 1,000 mg by mouth 2 (two) times daily.    Marland Kitchen amoxicillin-clavulanate (AUGMENTIN) 875-125 MG tablet Take 1 tablet by mouth 2 (two) times daily. (Patient not taking: Reported on 03/17/2020)    . aspirin 325 MG tablet Take by mouth.    Marland Kitchen azithromycin (ZITHROMAX) 500 MG tablet Take 500 mg by mouth 3 (three) times a week.     Marland Kitchen buPROPion (WELLBUTRIN SR) 200 MG 12 hr tablet TAKE 1 TABLET TWICE A DAY  (Patient taking differently: Take 200 mg by mouth 2 (two) times daily. ) 180 tablet 3  . celecoxib (CELEBREX) 200 MG capsule Take 400 mg by mouth 2 (two) times daily.    . Cholecalciferol (VITAMIN D3) 25 MCG (1000 UT) CAPS Take 1,000 Units by mouth at bedtime.     . docusate sodium (COLACE) 100 MG capsule Take 200 mg by mouth daily.    Marland Kitchen ethambutol (MYAMBUTOL) 400 MG tablet Take 1,400 mg by mouth 3 (three) times a week.     . ethambutol (MYAMBUTOL) 400 MG tablet Take by mouth.    . Fluticasone-Umeclidin-Vilant (TRELEGY ELLIPTA) 100-62.5-25 MCG/INH AEPB Inhale 1 puff into the lungs daily. 180 each 3  . gabapentin (NEURONTIN) 300 MG capsule Take 600 mg by mouth 3 (three) times daily.     Marland Kitchen ibuprofen (ADVIL) 200 MG tablet Take by mouth.    Marland Kitchen ipratropium-albuterol (DUONEB) 0.5-2.5 (3) MG/3ML SOLN Inhale 3 mLs into the lungs every 4 (four) hours as needed (Only uses with cold symptoms).     Marland Kitchen lamoTRIgine (LAMICTAL) 200 MG tablet TAKE 1 TABLET TWICE A DAY (DOSE INCREASE) (Patient taking differently: Take 200 mg by mouth 2 (two) times daily. ) 180 tablet 3  . methocarbamol (ROBAXIN) 500 MG tablet Take 2 tablets (1,000 mg total) by mouth every 6 (six) hours. (Patient taking differently: Take 1,000 mg by mouth 3 (three) times daily. ) 80 tablet 0  . Milk Thistle 150 MG CAPS Take 300 mg by mouth daily.    . montelukast (SINGULAIR) 10 MG tablet Take 10 mg by mouth at bedtime.     Marland Kitchen omeprazole (PRILOSEC) 40 MG capsule Take 40 mg by mouth daily.     Marland Kitchen oxyCODONE (OXY IR/ROXICODONE) 5 MG immediate release tablet Take 1-2 tablets (5-10 mg total) by mouth every 4 (four) hours as needed for moderate pain (pain score 4-6). (Patient not taking: Reported on 03/17/2020) 30 tablet 0  . oxyCODONE-acetaminophen (PERCOCET) 10-325 MG tablet Take 1 tablet by mouth every 6 (six) hours as needed.    . propranolol (INDERAL) 10 MG tablet TAKE 1 TABLET(10 MG) BY MOUTH THREE TIMES DAILY AS NEEDED FOR SEVERE ANXIETY OR AGITATION  (Patient taking differently: Take 10 mg by mouth 3 (three) times daily as needed (anxiety). ) 270 tablet 0  . rifampin (RIFADIN) 300 MG capsule Take 600 mg by mouth 3 (three) times  a week.     . rifampin (RIFADIN) 300 MG capsule Take by mouth.    . traZODone (DESYREL) 50 MG tablet trazodone 50 mg tablet    . valACYclovir (VALTREX) 1000 MG tablet Take 2,000 mg by mouth 2 (two) times daily as needed (herpes simplex).     . valsartan-hydrochlorothiazide (DIOVAN-HCT) 160-12.5 MG tablet Take 1 tablet by mouth at bedtime.     Marland Kitchen venlafaxine XR (EFFEXOR XR) 150 MG 24 hr capsule Take 1 capsule (150 mg total) by mouth daily with breakfast. 90 capsule 0   No current facility-administered medications for this visit.     Musculoskeletal: Strength & Muscle Tone: UTA Gait & Station: UTA Patient leans: N/A  Psychiatric Specialty Exam: Review of Systems  Psychiatric/Behavioral: Positive for decreased concentration and dysphoric mood.  All other systems reviewed and are negative.   There were no vitals taken for this visit.There is no height or weight on file to calculate BMI.  General Appearance: Casual  Eye Contact:  Fair  Speech:  Clear and Coherent  Volume:  Normal  Mood:  Depressed improving  Affect:  Tearful  Thought Process:  Goal Directed and Descriptions of Associations: Intact  Orientation:  Full (Time, Place, and Person)  Thought Content: Logical   Suicidal Thoughts:  No  Homicidal Thoughts:  No  Memory:  Immediate;   Fair Recent;   Fair Remote;   Fair  Judgement:  Fair  Insight:  Fair  Psychomotor Activity:  Normal  Concentration:  Concentration: Fair and Attention Span: Fair  Recall:  [employer] of Knowledge: Fair  Language: Fair  Akathisia:  No  Handed:  Right  AIMS (if indicated): UTA  Assets:  Communication Skills Desire for Improvement Housing Intimacy Social Support  ADL's:  Intact  Cognition: WNL  Sleep:  improving   Screenings: PHQ2-9   Flowsheet Row  Pulmonary Rehab from [facility]/25/2017 [facility]  PHQ-2 Total Score 4  PHQ-9 Total Score 19       Assessment and Plan: Dilynn Pollnow is a 60 year old Hispanic female who has a history of bipolar disorder, COPD, GERD was evaluated by telemedicine today.  Patient is currently making progress reports recent depressive symptoms as improving.  Patient with psychosocial stressors of her own health issues, husband's health problems, the pandemic will continue to benefit from CBT.  Plan as noted below.  Plan Bipolar disorder-improving Lamictal 200 mg p.o. twice daily Wellbutrin SR 200 mg p.o. twice daily Trazodone 25 to 50 mg p.o. nightly as needed Propranolol 10 mg p.o. 3 times daily as needed for severe anxiety agitation Venlafaxine extended release 150 mg p.o. daily We will avoid medications in the class of second generation antipsychotic like Rexulti, Abilify due to drug to drug interaction with rifampin. Patient to continue CBT with Ms. Zadie Rhine.  GAD-some progress Venlafaxine extended release 150 mg p.o. daily Continue CBT  Follow-up in clinic in 1 month or sooner if needed.  I have spent atleast 20 minutes face to face by video with patient today. More than 50 % of the time was spent for preparing to see the patient ( e.g., review of test, records ),  ordering medications and test ,psychoeducation and supportive psychotherapy and care coordination,as well as documenting clinical information in electronic health record. This note was generated in part or whole with voice recognition software. Voice recognition is usually quite accurate but there are transcription errors that can and very often do occur. I apologize for  any typographical errors that were not detected and corrected.      [name], MD 06/16/2020, 11:00 AM

## 2020-06-21 ENCOUNTER — Other Ambulatory Visit: Payer: Self-pay

## 2020-06-21 ENCOUNTER — Encounter: Payer: Self-pay | Admitting: Licensed Clinical Social Worker

## 2020-06-21 ENCOUNTER — Ambulatory Visit (INDEPENDENT_AMBULATORY_CARE_PROVIDER_SITE_OTHER): Admitting: Licensed Clinical Social Worker

## 2020-06-21 DIAGNOSIS — F3131 Bipolar disorder, current episode depressed, mild: Secondary | ICD-10-CM | POA: Diagnosis not present

## 2020-06-21 DIAGNOSIS — F411 Generalized anxiety disorder: Secondary | ICD-10-CM

## 2020-06-21 NOTE — Progress Notes (Signed)
Virtual Visit via Video Note  I connected with Bailey Flores on 06/21/20 at  2:00 PM EST by a video enabled telemedicine application and verified that I am speaking with the correct person using two identifiers.  Participating Parties Patient Spouse Provider  Location: Patient: Home Provider: Home Office   I discussed the limitations of evaluation and management by telemedicine and the availability of in person appointments. The patient expressed understanding and agreed to proceed.  THERAPY PROGRESS NOTE  Session Time: 106 Minutes  Participation Level: Active  Behavioral Response: Casual and Well GroomedAlertEuthymic  Type of Therapy: Individual Therapy  Treatment Goals addressed:  Identify an anxiety coping mechanism that has been successful in the past and increase its use - ongoing  Engage in physical and recreational activities that reflect increased energy and interest - ongoing  Interventions: CBT  Summary: Bailey Flores is a 60 y.o. female who presents with minimal anxiety and depression sxs. Pt reported "I am feeling much better. I got sick this week and spent some of the days sleeping and now moving around more". Pt reported she is planning to engage in hobby with spouse involving making things out of recycled plastic. Pt reported some difficulties with sleep such as waking up multiple times in middle of the night and/or not waking up until late in the day, "but I am getting my full hours". Pt reported she has not begun taking trazodone. Pt reported compliance with Effexor and met with psychiatrist to discuss medication management. Spouse was present for some of the session and added collateral information. Spouse reported that patient seems to have shown an increase in motivation and re-engagement in ADLs. Pt reported she is walking more with goal of 15-20 minutes per day.   Suicidal/Homicidal: No  Therapist Response: Therapist met with patient for  follow up. Therapist and patient reviewed improvement in sxs and mood. Therapist and patient discussed ways to become more active and socialize with spouse and family members. Pt was receptive.   Plan: Return again in 1 week.  Diagnosis: Axis I: Bipolar, Depressed and Generalized Anxiety Disorder    Axis II: N/A  [name], LCSW, LCAS 06/21/2020

## 2020-06-29 ENCOUNTER — Ambulatory Visit (INDEPENDENT_AMBULATORY_CARE_PROVIDER_SITE_OTHER): Admitting: Licensed Clinical Social Worker

## 2020-06-29 ENCOUNTER — Other Ambulatory Visit: Payer: Self-pay

## 2020-06-29 ENCOUNTER — Encounter: Payer: Self-pay | Admitting: Licensed Clinical Social Worker

## 2020-06-29 DIAGNOSIS — F411 Generalized anxiety disorder: Secondary | ICD-10-CM

## 2020-06-29 DIAGNOSIS — F3131 Bipolar disorder, current episode depressed, mild: Secondary | ICD-10-CM

## 2020-06-29 NOTE — Progress Notes (Signed)
Virtual Visit via Video Note  I connected with Bailey Flores on 06/29/20 at  2:00 PM EST by a video enabled telemedicine application and verified that I am speaking with the correct person using two identifiers.  Participating Parties Patient Provider  Location: Patient: Home Provider: Home Office   I discussed the limitations of evaluation and management by telemedicine and the availability of in person appointments. The patient expressed understanding and agreed to proceed.  THERAPY PROGRESS NOTE  Session Time: 55 Minutes  Participation Level: Active  Behavioral Response: CasualAlertAnxious  Type of Therapy: Individual Therapy  Treatment Goals addressed: Coping  Interventions: CBT  Summary: Bailey Flores is a 60 y.o. female who presents with anxiety and depression sxs. Pt reported she continues to struggle with feelings of "overwhelm" and believes "by the time I realize it half the day is gone and wasted. What others can do in 5 minutes takes me 15 minutes. Pt reported that she has spent time at husband's shop and still gathering plastic. Pt reported she used to do jigsaw puzzles and would like to start this hobby again, however feels "guilty" for not feeling motivated to get house clean and organized. Pt described an issue with spouse that she is intending to discuss with him, however recognizes desire to avoid confrontation and assuming how he will respond. Pt reported "I feel like I don't matter, my opinions and wants don't matter. I get angry and do something else and forget about it".  Pt reported that due to insurance reasons, she would like to reduce sessions to once per month.  Suicidal/Homicidal: No  Therapist Response: Therapist met with patient for follow up. Therapist and patient reviewed C-SRSS, nutrition and pain assessments. Therapist and patient came up with specific objectives for treatment plan goals. Therapist and patient processed thoughts,  feelings and reactions. Therapist validated patient feelings/concerns.  Plan: Return again in 1 month.  Diagnosis: Axis I: Bipolar, Depressed and Generalized Anxiety Disorder    Axis II: N/A  [name], LCSW, LCAS 06/29/2020

## 2020-07-06 ENCOUNTER — Ambulatory Visit: Admitting: Licensed Clinical Social Worker

## 2020-07-21 ENCOUNTER — Other Ambulatory Visit: Payer: Self-pay

## 2020-07-21 ENCOUNTER — Telehealth (INDEPENDENT_AMBULATORY_CARE_PROVIDER_SITE_OTHER): Admitting: Psychiatry

## 2020-07-21 ENCOUNTER — Encounter: Payer: Self-pay | Admitting: Psychiatry

## 2020-07-21 DIAGNOSIS — F411 Generalized anxiety disorder: Secondary | ICD-10-CM | POA: Diagnosis not present

## 2020-07-21 DIAGNOSIS — F3176 Bipolar disorder, in full remission, most recent episode depressed: Secondary | ICD-10-CM | POA: Diagnosis not present

## 2020-07-21 DIAGNOSIS — Z8659 Personal history of other mental and behavioral disorders: Secondary | ICD-10-CM | POA: Diagnosis not present

## 2020-07-21 MED ORDER — VENLAFAXINE HCL ER 37.5 MG PO CP24: 37.5000 mg | ORAL_CAPSULE | Freq: Every day | ORAL | 0 refills | Status: DC

## 2020-07-21 MED ORDER — VENLAFAXINE HCL ER 150 MG PO CP24: 150.0000 mg | ORAL_CAPSULE | Freq: Every day | ORAL | 0 refills | Status: DC

## 2020-07-21 NOTE — Patient Instructions (Addendum)
[facility] [address] [place], [place] [address] Phone [phone]

## 2020-07-21 NOTE — Progress Notes (Signed)
Virtual Visit via Video Note  I connected with Bailey Flores on 07/21/20 at  9:30 AM EST by a video enabled telemedicine application and verified that I am speaking with the correct person using two identifiers.  Location Provider Location : [facility] Patient Location : Home  Participants: Patient ,Spouse, Provider    I discussed the limitations of evaluation and management by telemedicine and the availability of in person appointments. The patient expressed understanding and agreed to proceed.   I discussed the assessment and treatment plan with the patient. The patient was provided an opportunity to ask questions and all were answered. The patient agreed with the plan and demonstrated an understanding of the instructions.   The patient was advised to call back or seek an in-person evaluation if the symptoms worsen or if the condition fails to improve as anticipated.   [place] MD OP Progress Note  07/21/2020 9:11 PM Bailey Flores  MRN:  [id number]  Chief Complaint:  Chief Complaint    Follow-up     HPI: Bailey Flores is a 60 year old female, lives in [place], has a history of bipolar disorder, ADHD per history, cognitive disorder, COPD, hypertension, chronic pain was evaluated by telemedicine today.  Patient today reports since the past few weeks she has been feeling better with regards to her mood.  She has more good days than bad days.  She reports when she started taking the venlafaxine she felt dizzy or lightheaded the first few weeks.  She however reports that cleared out and currently she is tolerating it well.  Patient reports improvement in her anxiety symptoms.  She however continues to have restlessness and nervousness on and off.  She was noted as restless in session, even though seated was moving around a lot.  She is able to relax, enjoys spending time with her spouse, enjoys watching her favorite TV shows.  Patient however reports she continues to  struggle with concentration.  She feels as though she is unable to stay on task often.  Patient does report increased appetite especially for sweets recently.  She reports she is interested in diet management and is willing to watch her diet.  Patient denies any suicidality, homicidality or perceptual disturbances.  Patient denies any other concerns today.  Visit Diagnosis:    ICD-10-CM   1. Bipolar disorder, in full remission, most recent episode depressed ([place])  F31.76   2. GAD (generalized anxiety disorder)  F41.1 venlafaxine XR (EFFEXOR XR) 150 MG 24 hr capsule    venlafaxine XR (EFFEXOR-XR) 37.5 MG 24 hr capsule  3. History of ADHD  Z86.59     Past Psychiatric History: I have reviewed past psychiatric history from my progress note on 08/10/2017.  Past Medical History:  Past Medical History:  Diagnosis Date  . Arthritis    hands  . Asthma   . Barrett's esophagus   . Cancer [facility])    neuroendocrine ca  . COPD (chronic obstructive pulmonary disease) ([place])   . Depression   . GERD (gastroesophageal reflux disease)   . Hypertension   . Motion sickness    reading in car  . 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 Orthodontics    permanent retainers, top and bottom  . Pneumonia 2017, 2020    Past Surgical History:  Procedure Laterality Date  . ABDOMINAL HYSTERECTOMY    . ANTERIOR CERVICAL DECOMPRESSION/DISCECTOMY FUSION 4 LEVELS N/A 04/09/2019   Procedure: ANTERIOR CERVICAL DECOMPRESSION/DISCECTOMY FUSION 5 LEVELS C3-T1;  Surgeon: Meade Maw, MD;  Location: [facility] ORS;  Service: Neurosurgery;  Laterality: N/A;  . BIOPSY  03/17/2019   Procedure: BIOPSY;  Surgeon: [name], MD;  Location: [place];  Service: Endoscopy;;  . BOWEL RESECTION  07/19/2012  . CESAREAN SECTION     x3  . COLONOSCOPY WITH PROPOFOL N/A 12/02/2019   Procedure: COLONOSCOPY WITH PROPOFOL;  Surgeon: [name], MD;  Location[facility] ENDOSCOPY;  Service: Endoscopy;  Laterality: N/A;   . ESOPHAGOGASTRODUODENOSCOPY (EGD) WITH PROPOFOL N/A 03/05/2017   Procedure: ESOPHAGOGASTRODUODENOSCOPY (EGD) WITH PROPOFOL;  Surgeon: [name], MD;  Location: [place];  Service: Endoscopy;  Laterality: N/A;  . ESOPHAGOGASTRODUODENOSCOPY (EGD) WITH PROPOFOL N/A 03/30/2017   REPEAT IN 03/2019  . ESOPHAGOGASTRODUODENOSCOPY (EGD) WITH PROPOFOL N/A 03/17/2019   Procedure: ESOPHAGOGASTRODUODENOSCOPY (EGD) WITH PROPOFOL;  Surgeon: [name], MD;  Location: [place];  Service: Endoscopy;  Laterality: N/A;  . HAND SURGERY Right   . TOTAL SHOULDER ARTHROPLASTY Right 01/05/2020   Procedure: Right total shoulder arthroplasty, biceps tenodesis;  Surgeon: Leim Fabry, MD;  Location: [facility] ORS;  Service: Orthopedics;  Laterality: Right;  . TRANSFORAMINAL LUMBAR INTERBODY FUSION (TLIF) WITH PEDICLE SCREW FIXATION 1 LEVEL N/A 12/05/2017   Procedure: TRANSFORAMINAL LUMBAR INTERBODY FUSION (TLIF) WITH PEDICLE SCREW FIXATION 1 LEVEL-L4-5;  Surgeon: Meade Maw, MD;  Location: [facility] ORS;  Service: Neurosurgery;  Laterality: N/A;    Family Psychiatric History: I have reviewed family psychiatric history from my progress note on 08/10/2017.  Family History:  Family History  Problem Relation Age of Onset  . Breast cancer Cousin        mat cousin    Social History: Reviewed social history from my progress note on 08/10/2017. Social History   Socioeconomic History  . Marital status: Married    Spouse name: Not on file  . Number of children: Not on file  . Years of education: Not on file  . Highest education level: Not on file  Occupational History  . Not on file  Tobacco Use  . Smoking status: Former Smoker    Packs/day: 2.00    Years: 37.00    Pack years: 74.00    Types: Cigarettes    Quit date: 07/19/2012    Years since quitting: 8.0  . Smokeless tobacco: Never Used  Vaping Use  . Vaping Use: Never used  Substance and Sexual Activity  . Alcohol use: Yes     Comment: 2 drinks/mo  . Drug use: No  . Sexual activity: Yes  Other Topics Concern  . Not on file  Social History Narrative  . Not on file   Social Determinants of Health   Financial Resource Strain: Not on file  Food Insecurity: Not on file  Transportation Needs: Not on file  Physical Activity: Not on file  Stress: Not on file  Social Connections: Not on file    Allergies: No Known Allergies  Metabolic Disorder Labs: Lab Results  Component Value Date   HGBA1C 5.9 (H) 06/13/2017   MPG 122.63 06/13/2017   Lab Results  Component Value Date   PROLACTIN 4.6 (L) 06/13/2017   Lab Results  Component Value Date   CHOL 183 06/13/2017   TRIG 80 06/13/2017   HDL 92 06/13/2017   CHOLHDL 2.0 06/13/2017   VLDL 16 06/13/2017   LDLCALC 75 06/13/2017   Lab Results  Component Value Date   TSH 1.205 06/13/2017    Therapeutic Level Labs: No results found for: LITHIUM No results found for: VALPROATE No components found for:  CBMZ  Current Medications: Current Outpatient Medications  Medication Sig Dispense Refill  . sulfamethoxazole-trimethoprim (BACTRIM DS) 800-160 MG tablet     . venlafaxine XR (EFFEXOR-XR) 37.5 MG 24 hr capsule Take 1 capsule (37.5 mg total) by mouth daily with breakfast. Start taking with 150 mg daily 90 capsule 0  . acetaminophen (TYLENOL) 650 MG CR tablet Take 1,950 mg by mouth every 8 (eight) hours.     Marland Kitchen albuterol (VENTOLIN HFA) 108 (90 Base) MCG/ACT inhaler Inhale 1-2 puffs into the lungs every 4 (four) hours as needed for wheezing or shortness of breath. 3 each 3  . amoxicillin (AMOXIL) 500 MG capsule Take 1,000 mg by mouth 2 (two) times daily.    Marland Kitchen amoxicillin-clavulanate (AUGMENTIN) 875-125 MG tablet Take 1 tablet by mouth 2 (two) times daily. (Patient not taking: Reported on 03/17/2020)    . aspirin 325 MG tablet Take by mouth.    Marland Kitchen azithromycin (ZITHROMAX) 500 MG tablet Take 500 mg by mouth 3 (three) times a week.     Marland Kitchen buPROPion (WELLBUTRIN SR)  200 MG 12 hr tablet TAKE 1 TABLET TWICE A DAY (Patient taking differently: Take 200 mg by mouth 2 (two) times daily. ) 180 tablet 3  . celecoxib (CELEBREX) 200 MG capsule Take 400 mg by mouth 2 (two) times daily.    . Cholecalciferol (VITAMIN D3) 25 MCG (1000 UT) CAPS Take 1,000 Units by mouth at bedtime.     . docusate sodium (COLACE) 100 MG capsule Take 200 mg by mouth daily.    Marland Kitchen ethambutol (MYAMBUTOL) 400 MG tablet Take 1,400 mg by mouth 3 (three) times a week.     . ethambutol (MYAMBUTOL) 400 MG tablet Take by mouth.    . Fluticasone-Umeclidin-Vilant (TRELEGY ELLIPTA) 100-62.5-25 MCG/INH AEPB Inhale 1 puff into the lungs daily. 180 each 3  . gabapentin (NEURONTIN) 300 MG capsule Take 600 mg by mouth 3 (three) times daily.     Marland Kitchen ibuprofen (ADVIL) 200 MG tablet Take by mouth.    Marland Kitchen ipratropium-albuterol (DUONEB) 0.5-2.5 (3) MG/3ML SOLN Inhale 3 mLs into the lungs every 4 (four) hours as needed (Only uses with cold symptoms).     Marland Kitchen lamoTRIgine (LAMICTAL) 200 MG tablet TAKE 1 TABLET TWICE A DAY (DOSE INCREASE) (Patient taking differently: Take 200 mg by mouth 2 (two) times daily. ) 180 tablet 3  . methocarbamol (ROBAXIN) 500 MG tablet Take 2 tablets (1,000 mg total) by mouth every 6 (six) hours. (Patient taking differently: Take 1,000 mg by mouth 3 (three) times daily. ) 80 tablet 0  . Milk Thistle 150 MG CAPS Take 300 mg by mouth daily.    . montelukast (SINGULAIR) 10 MG tablet Take 10 mg by mouth at bedtime.     Marland Kitchen omeprazole (PRILOSEC) 40 MG capsule Take 40 mg by mouth daily.     Marland Kitchen oxyCODONE (OXY IR/ROXICODONE) 5 MG immediate release tablet Take 1-2 tablets (5-10 mg total) by mouth every 4 (four) hours as needed for moderate pain (pain score 4-6). (Patient not taking: Reported on 03/17/2020) 30 tablet 0  . oxyCODONE-acetaminophen (PERCOCET) 10-325 MG tablet Take 1 tablet by mouth every 6 (six) hours as needed.    . propranolol (INDERAL) 10 MG tablet TAKE 1 TABLET(10 MG) BY MOUTH THREE TIMES DAILY  AS NEEDED FOR SEVERE ANXIETY OR AGITATION (Patient taking differently: Take 10 mg by mouth 3 (three) times daily as needed (anxiety). ) 270 tablet 0  . rifampin (RIFADIN) 300 MG capsule Take 600 mg by  mouth 3 (three) times a week.     . rifampin (RIFADIN) 300 MG capsule Take by mouth.    . traZODone (DESYREL) 50 MG tablet trazodone 50 mg tablet    . valACYclovir (VALTREX) 1000 MG tablet Take 2,000 mg by mouth 2 (two) times daily as needed (herpes simplex).     . valsartan-hydrochlorothiazide (DIOVAN-HCT) 160-12.5 MG tablet Take 1 tablet by mouth at bedtime.     Marland Kitchen venlafaxine XR (EFFEXOR XR) 150 MG 24 hr capsule Take 1 capsule (150 mg total) by mouth daily with breakfast. 90 capsule 0  . Vitamin D-Vitamin K (K2 PLUS D3) [phone] MCG-UNIT TABS Take 1 tablet by mouth daily.     No current facility-administered medications for this visit.     Musculoskeletal: Strength & Muscle Tone: UTA Gait & Station: UTA Patient leans: N/A  Psychiatric Specialty Exam: Review of Systems  Psychiatric/Behavioral: Positive for decreased concentration. The patient is nervous/anxious.   All other systems reviewed and are negative.   There were no vitals taken for this visit.There is no height or weight on file to calculate BMI.  General Appearance: Casual  Eye Contact:  Fair  Speech:  Clear and Coherent  Volume:  Normal  Mood:  Anxious  Affect:  Congruent  Thought Process:  Goal Directed and Descriptions of Associations: Intact  Orientation:  Full (Time, Place, and Person)  Thought Content: Logical   Suicidal Thoughts:  No  Homicidal Thoughts:  No  Memory:  Immediate;   Fair Recent;   Fair Remote;   Fair  Judgement:  Fair  Insight:  Fair  Psychomotor Activity:  Restlessness  Concentration:  Concentration: Fair and Attention Span: Fair  Recall:  [employer] of Knowledge: Fair  Language: Fair  Akathisia:  No  Handed:  Right  AIMS (if indicated): UTA  Assets:  Communication Skills Desire for  Improvement Housing Intimacy Social Support Talents/Skills Transportation Vocational/Educational  ADL's:  Intact  Cognition: WNL  Sleep:  Improving   Screenings: PHQ2-9   Flowsheet Row Video Visit from 07/21/2020 in [facility] from 06/25/2017 in [facility]  PHQ-2 Total Score 1 4  PHQ-9 Total Score -- 19    Flowsheet Row Video Visit from 07/21/2020 in [facility] from 06/29/2020 in [place] Low [facility] Low Risk       Assessment and Plan: Bailey Flores is a 60 year old Hispanic female who has a history of bipolar disorder, COPD, GERD was evaluated by telemedicine today.  Patient is currently making progress with regards to her mood however continues to struggle with concentration problems.  Her concentration problems likely multifactorial secondary to her being on polypharmacy, medications like muscle relaxants, previously on opiate medications as well as chronic pain and other medical problems.  Discussed plan as noted below.  Plan Bipolar disorder in remission Lamictal 200 mg p.o. twice daily Wellbutrin SR 200 mg p.o. twice daily Trazodone 25 to 50 mg p.o. nightly as needed Propranolol 10 mg p.o. 3 times daily as needed for severe anxiety/agitation Venlafaxine extended release as prescribed.   GAD-improving However will increase venlafaxine extended release 187.5 mg p.o. daily. This is to address her concentration problems. Continue CBT.  Collateral information obtained from spouse who reports patient does have concentration problems otherwise making progress.  Patient did have neuropsychological testing done recently which ruled out ADHD.  Patient's concentration problem multifactorial as noted above.  Follow-up in clinic  in 4 to 5 weeks or sooner if needed.  This note was generated in part or whole with voice  recognition software. Voice recognition is usually quite accurate but there are transcription errors that can and very often do occur. I apologize for any typographical errors that were not detected and corrected.      Bailey Alert, MD 07/21/2020, 9:11 PM

## 2020-07-27 ENCOUNTER — Encounter: Payer: Self-pay | Admitting: Licensed Clinical Social Worker

## 2020-07-27 ENCOUNTER — Ambulatory Visit (INDEPENDENT_AMBULATORY_CARE_PROVIDER_SITE_OTHER): Admitting: Licensed Clinical Social Worker

## 2020-07-27 ENCOUNTER — Other Ambulatory Visit: Payer: Self-pay

## 2020-07-27 DIAGNOSIS — F3176 Bipolar disorder, in full remission, most recent episode depressed: Secondary | ICD-10-CM | POA: Diagnosis not present

## 2020-07-27 DIAGNOSIS — R413 Other amnesia: Secondary | ICD-10-CM | POA: Diagnosis not present

## 2020-07-27 DIAGNOSIS — F411 Generalized anxiety disorder: Secondary | ICD-10-CM

## 2020-07-27 NOTE — Progress Notes (Signed)
Virtual Visit via Video Note  I connected with [place] on 07/27/20 at  2:00 PM EST by a video enabled telemedicine application and verified that I am speaking with the correct person using two identifiers.  Participating Parties Patient Provider  Location: Patient: Home Provider: Home Office   I discussed the limitations of evaluation and management by telemedicine and the availability of in person appointments. The patient expressed understanding and agreed to proceed.  THERAPY PROGRESS NOTE  Session Time: 107 Minutes  Participation Level: Active  Behavioral Response: CasualAlertEuthymic  Type of Therapy: Individual Therapy  Treatment Goals addressed: Anxiety and Coping  Interventions: CBT  Summary: Carey Lafon is a 60 y.o. female who presents with minimal anxiety and depression sxs. Pt reported "things have been okay," and increase in motivation to complete household tasks since last session. Pt reported she was experiencing increase in hunger from the Effexor and talked with psychiatrist about her concerns due to "3 lb" weight gain. Pt reported she has been assisting her daughter with move from [place] to [place] and feeling a bit tired from getting home late last night. Pt reported "we are in a good space" in reference to relationship with spouse w/ increase in intimacy. Pt also is regularly communicating with her children and enjoys spending time with them. Pt denied any other concerns at this time.  Suicidal/Homicidal: No  Therapist Response: Therapist met with patient for follow up. Therapist and patient reviewed sxs and coping skills utilized. Therapist and patient processed thoughts, feelings and reactions to improved motivation as well as mainly positive interactions with spouse and children.    Plan: Return again in 1 month.  Diagnosis: Axis I: Bipolar, Depressed, Generalized Anxiety Disorder and Memory Problem    Axis II: N/A  [name], LCSW, LCAS 07/27/2020

## 2020-08-02 ENCOUNTER — Telehealth: Payer: Self-pay

## 2020-08-02 NOTE — Telephone Encounter (Signed)
Returned call to patient.  She reports she stopped the venlafaxine since she was hungry and gaining weight.  Since she stopped it she has been fine and denies any increased appetite.  She is agreeable to waiting and watching how she does without the venlafaxine and would like to wait before starting another medication to take its place.  She is also actively looking to find a provider closer to where she lives.

## 2020-08-02 NOTE — Telephone Encounter (Signed)
pt called states that she stopped the effexor states that she stayed hungery all the time and she was gaining weight. is there something else she can try.

## 2020-08-25 ENCOUNTER — Other Ambulatory Visit: Payer: Self-pay

## 2020-08-25 ENCOUNTER — Encounter: Payer: Self-pay | Admitting: Psychiatry

## 2020-08-25 ENCOUNTER — Ambulatory Visit (INDEPENDENT_AMBULATORY_CARE_PROVIDER_SITE_OTHER): Admitting: Licensed Clinical Social Worker

## 2020-08-25 ENCOUNTER — Encounter: Payer: Self-pay | Admitting: Licensed Clinical Social Worker

## 2020-08-25 ENCOUNTER — Telehealth (INDEPENDENT_AMBULATORY_CARE_PROVIDER_SITE_OTHER): Admitting: Psychiatry

## 2020-08-25 DIAGNOSIS — F411 Generalized anxiety disorder: Secondary | ICD-10-CM

## 2020-08-25 DIAGNOSIS — F3176 Bipolar disorder, in full remission, most recent episode depressed: Secondary | ICD-10-CM

## 2020-08-25 DIAGNOSIS — R413 Other amnesia: Secondary | ICD-10-CM

## 2020-08-25 NOTE — Progress Notes (Signed)
Virtual Visit via Video Note  I connected with Bailey Flores on 08/25/20 at 11:00 AM EDT by a video enabled telemedicine application and verified that I am speaking with the correct person using two identifiers.  Location Provider Location : [facility] Patient Location : Home  Participants: Patient , Spouse,Provider   I discussed the limitations of evaluation and management by telemedicine and the availability of in person appointments. The patient expressed understanding and agreed to proceed.   I discussed the assessment and treatment plan with the patient. The patient was provided an opportunity to ask questions and all were answered. The patient agreed with the plan and demonstrated an understanding of the instructions.   The patient was advised to call back or seek an in-person evaluation if the symptoms worsen or if the condition fails to improve as anticipated.   [place] MD OP Progress Note  08/25/2020 1:08 PM Bailey Flores  MRN:  [id number]  Chief Complaint:  Chief Complaint    Follow-up; Anxiety; Depression     HPI: Bailey Flores is a 60 year old female, lives in [place], has a history of bipolar disorder, ADHD per history, cognitive disorder, COPD, hypertension, chronic pain was evaluated by telemedicine today.  Patient as well as husband participated in evaluation today.  Patient stopped taking the venlafaxine due to side effects.  She reports since stopping it she has been feeling much better.  She denies any more dizziness or lightheadedness since being off of it.  She continues to have an increased appetite however has been trying to make healthy choices.  Patient reports overall her mood symptoms are better.  She denies any significant mood swings and reports they have improved.  She does report sleep is good.  She does not take the trazodone anymore.  Patient reports she has been spending time gardening vegetables with her husband and enjoys  it.  Patient denies any suicidality, homicidality or perceptual disturbances.  She is compliant on the Lamictal and denies side effects.  She also takes Wellbutrin and denies side effects.  Patient denies any other concerns today.  Husband reports patient is currently making progress and he has noticed an improvement in her.  Visit Diagnosis:    ICD-10-CM   1. Bipolar disorder, in full remission, most recent episode depressed ([place])  F31.76   2. GAD (generalized anxiety disorder)  F41.1     Past Psychiatric History: I have reviewed past psychiatric history from my progress note on 08/10/2017  Past Medical History:  Past Medical History:  Diagnosis Date  . Arthritis    hands  . Asthma   . Barrett's esophagus   . Cancer [facility])    neuroendocrine ca  . COPD (chronic obstructive pulmonary disease) ([place])   . Depression   . GERD (gastroesophageal reflux disease)   . Hypertension   . Motion sickness    reading in car  . 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 Orthodontics    permanent retainers, top and bottom  . Pneumonia 2017, 2020    Past Surgical History:  Procedure Laterality Date  . ABDOMINAL HYSTERECTOMY    . ANTERIOR CERVICAL DECOMPRESSION/DISCECTOMY FUSION 4 LEVELS N/A 04/09/2019   Procedure: ANTERIOR CERVICAL DECOMPRESSION/DISCECTOMY FUSION 5 LEVELS C3-T1;  Surgeon: Meade Maw, MD;  Location: [facility] ORS;  Service: Neurosurgery;  Laterality: N/A;  . BIOPSY  03/17/2019   Procedure: BIOPSY;  Surgeon: [name], MD;  Location: [place];  Service: Endoscopy;;  . BOWEL RESECTION  07/19/2012  . CESAREAN SECTION  x3  . COLONOSCOPY WITH PROPOFOL N/A 12/02/2019   Procedure: COLONOSCOPY WITH PROPOFOL;  Surgeon: [name], MD;  Location: [facility] ENDOSCOPY;  Service: Endoscopy;  Laterality: N/A;  . ESOPHAGOGASTRODUODENOSCOPY (EGD) WITH PROPOFOL N/A 03/05/2017   Procedure: ESOPHAGOGASTRODUODENOSCOPY (EGD) WITH PROPOFOL;  Surgeon: [name], MD;   Location: [place];  Service: Endoscopy;  Laterality: N/A;  . ESOPHAGOGASTRODUODENOSCOPY (EGD) WITH PROPOFOL N/A 03/30/2017   REPEAT IN 03/2019  . ESOPHAGOGASTRODUODENOSCOPY (EGD) WITH PROPOFOL N/A 03/17/2019   Procedure: ESOPHAGOGASTRODUODENOSCOPY (EGD) WITH PROPOFOL;  Surgeon: [name], MD;  Location: [place];  Service: Endoscopy;  Laterality: N/A;  . HAND SURGERY Right   . TOTAL SHOULDER ARTHROPLASTY Right 01/05/2020   Procedure: Right total shoulder arthroplasty, biceps tenodesis;  Surgeon: Leim Fabry, MD;  Location: [facility] ORS;  Service: Orthopedics;  Laterality: Right;  . TRANSFORAMINAL LUMBAR INTERBODY FUSION (TLIF) WITH PEDICLE SCREW FIXATION 1 LEVEL N/A 12/05/2017   Procedure: TRANSFORAMINAL LUMBAR INTERBODY FUSION (TLIF) WITH PEDICLE SCREW FIXATION 1 LEVEL-L4-5;  Surgeon: Meade Maw, MD;  Location: [facility] ORS;  Service: Neurosurgery;  Laterality: N/A;    Family Psychiatric History: I have reviewed family psychiatric history from my progress note on 08/10/2017  Family History:  Family History  Problem Relation Age of Onset  . Breast cancer Cousin        mat cousin    Social History: Reviewed social history from my progress note on 08/10/2017 Social History   Socioeconomic History  . Marital status: Married    Spouse name: Not on file  . Number of children: Not on file  . Years of education: Not on file  . Highest education level: Not on file  Occupational History  . Not on file  Tobacco Use  . Smoking status: Former Smoker    Packs/day: 2.00    Years: 37.00    Pack years: 74.00    Types: Cigarettes    Quit date: 07/19/2012    Years since quitting: 8.1  . Smokeless tobacco: Never Used  Vaping Use  . Vaping Use: Never used  Substance and Sexual Activity  . Alcohol use: Yes    Comment: 2 drinks/mo  . Drug use: No  . Sexual activity: Yes  Other Topics Concern  . Not on file  Social History Narrative  . Not on file   Social  Determinants of Health   Financial Resource Strain: Not on file  Food Insecurity: Not on file  Transportation Needs: Not on file  Physical Activity: Not on file  Stress: Not on file  Social Connections: Not on file    Allergies: No Known Allergies  Metabolic Disorder Labs: Lab Results  Component Value Date   HGBA1C 5.9 (H) 06/13/2017   MPG 122.63 06/13/2017   Lab Results  Component Value Date   PROLACTIN 4.6 (L) 06/13/2017   Lab Results  Component Value Date   CHOL 183 06/13/2017   TRIG 80 06/13/2017   HDL 92 06/13/2017   CHOLHDL 2.0 06/13/2017   VLDL 16 06/13/2017   LDLCALC 75 06/13/2017   Lab Results  Component Value Date   TSH 1.205 06/13/2017    Therapeutic Level Labs: No results found for: LITHIUM No results found for: VALPROATE No components found for:  CBMZ  Current Medications: Current Outpatient Medications  Medication Sig Dispense Refill  . acetaminophen (TYLENOL) 650 MG CR tablet Take 1,950 mg by mouth every 8 (eight) hours.     Marland Kitchen albuterol (VENTOLIN HFA) 108 (90 Base) MCG/ACT inhaler Inhale 1-2 puffs  into the lungs every 4 (four) hours as needed for wheezing or shortness of breath. 3 each 3  . amoxicillin (AMOXIL) 500 MG capsule Take 1,000 mg by mouth 2 (two) times daily.    Marland Kitchen amoxicillin-clavulanate (AUGMENTIN) 875-125 MG tablet Take 1 tablet by mouth 2 (two) times daily. (Patient not taking: Reported on 03/17/2020)    . aspirin 325 MG tablet Take by mouth.    Marland Kitchen buPROPion (WELLBUTRIN SR) 200 MG 12 hr tablet TAKE 1 TABLET TWICE A DAY (Patient taking differently: Take 200 mg by mouth 2 (two) times daily. ) 180 tablet 3  . celecoxib (CELEBREX) 200 MG capsule Take 400 mg by mouth 2 (two) times daily.    . Cholecalciferol (VITAMIN D3) 25 MCG (1000 UT) CAPS Take 1,000 Units by mouth at bedtime.     . docusate sodium (COLACE) 100 MG capsule Take 200 mg by mouth daily.    Marland Kitchen ethambutol (MYAMBUTOL) 400 MG tablet Take 1,400 mg by mouth 3 (three) times a week.      . ethambutol (MYAMBUTOL) 400 MG tablet Take by mouth.    . Fluticasone-Umeclidin-Vilant (TRELEGY ELLIPTA) 100-62.5-25 MCG/INH AEPB Inhale 1 puff into the lungs daily. 180 each 3  . gabapentin (NEURONTIN) 300 MG capsule Take 600 mg by mouth 3 (three) times daily.     Marland Kitchen ibuprofen (ADVIL) 200 MG tablet Take by mouth.    Marland Kitchen ipratropium-albuterol (DUONEB) 0.5-2.5 (3) MG/3ML SOLN Inhale 3 mLs into the lungs every 4 (four) hours as needed (Only uses with cold symptoms).     Marland Kitchen lamoTRIgine (LAMICTAL) 200 MG tablet TAKE 1 TABLET TWICE A DAY (DOSE INCREASE) (Patient taking differently: Take 200 mg by mouth 2 (two) times daily. ) 180 tablet 3  . methocarbamol (ROBAXIN) 500 MG tablet Take 2 tablets (1,000 mg total) by mouth every 6 (six) hours. (Patient taking differently: Take 1,000 mg by mouth 3 (three) times daily. ) 80 tablet 0  . Milk Thistle 150 MG CAPS Take 300 mg by mouth daily.    . montelukast (SINGULAIR) 10 MG tablet Take 10 mg by mouth at bedtime.     Marland Kitchen omeprazole (PRILOSEC) 40 MG capsule Take 40 mg by mouth daily.     Marland Kitchen oxyCODONE (OXY IR/ROXICODONE) 5 MG immediate release tablet Take 1-2 tablets (5-10 mg total) by mouth every 4 (four) hours as needed for moderate pain (pain score 4-6). (Patient not taking: Reported on 03/17/2020) 30 tablet 0  . oxyCODONE-acetaminophen (PERCOCET) 10-325 MG tablet Take 1 tablet by mouth every 6 (six) hours as needed.    . propranolol (INDERAL) 10 MG tablet TAKE 1 TABLET(10 MG) BY MOUTH THREE TIMES DAILY AS NEEDED FOR SEVERE ANXIETY OR AGITATION (Patient taking differently: Take 10 mg by mouth 3 (three) times daily as needed (anxiety). ) 270 tablet 0  . rifampin (RIFADIN) 300 MG capsule Take 600 mg by mouth 3 (three) times a week.     . rifampin (RIFADIN) 300 MG capsule Take by mouth.    . sulfamethoxazole-trimethoprim (BACTRIM DS) 800-160 MG tablet     . traZODone (DESYREL) 50 MG tablet trazodone 50 mg tablet    . valACYclovir (VALTREX) 1000 MG tablet Take 2,000 mg  by mouth 2 (two) times daily as needed (herpes simplex).     . valsartan-hydrochlorothiazide (DIOVAN-HCT) 160-12.5 MG tablet Take 1 tablet by mouth at bedtime.     . Vitamin D-Vitamin K (K2 PLUS D3) [phone] MCG-UNIT TABS Take 1 tablet by mouth daily.  No current facility-administered medications for this visit.     Musculoskeletal: Strength & Muscle Tone: UTA Gait & Station: UTA Patient leans: N/A  Psychiatric Specialty Exam: Review of Systems  Musculoskeletal:       Shoulder - left side - pain  Psychiatric/Behavioral:       Mood swings - improved  All other systems reviewed and are negative.   There were no vitals taken for this visit.There is no height or weight on file to calculate BMI.  General Appearance: Casual  Eye Contact:  Fair  Speech:  Clear and Coherent  Volume:  Normal  Mood:  Mood swings - improved  Affect:  Congruent  Thought Process:  Goal Directed and Descriptions of Associations: Intact  Orientation:  Full (Time, Place, and Person)  Thought Content: Logical   Suicidal Thoughts:  No  Homicidal Thoughts:  No  Memory:  Immediate;   Fair Recent;   Fair Remote;   Fair  Judgement:  Fair  Insight:  Fair  Psychomotor Activity:  Normal  Concentration:  Concentration: Fair and Attention Span: Fair  Recall:  [employer] of Knowledge: Fair  Language: Fair  Akathisia:  No  Handed:  Right  AIMS (if indicated): UTA  Assets:  Communication Skills Desire for Improvement Financial Resources/Insurance Housing Social Support Talents/Skills Transportation Vocational/Educational  ADL's:  Intact  Cognition: WNL  Sleep:  Fair   Screenings: PHQ2-9   Flowsheet Row Video Visit from 08/25/2020 in [place] Video Visit from 07/21/2020 in [facility] from 06/25/2017 in [facility]  PHQ-2 Total Score 1 1 4   PHQ-9 Total Score -- -- 19    Flowsheet Row Video Visit from  08/25/2020 in [place] Video Visit from 07/21/2020 in [place] Counselor from 06/29/2020 in [place] Low Risk Low Risk Low Risk       Assessment and Plan: Bailey Flores is a 60 year old Hispanic female who has a history of bipolar disorder, COPD, GERD was evaluated by telemedicine today.  Patient is currently making progress.  Plan as noted below.  Plan Bipolar disorder in remission Lamotrigine 200 mg p.o. twice daily Wellbutrin SR 200 mg p.o. twice daily Trazodone 25 to 50 mg p.o. nightly as needed-she has not been using it much Propranolol 10 mg p.o. 3 times daily as needed for severe anxiety agitation-she has not been using it. Discontinue venlafaxine for side effects.  She stopped using it few weeks ago and is currently doing well without it.  GAD-stable Continue CBT with Ms. Zadie Rhine. Propranolol 10 mg p.o. 3 times daily as needed for severe anxiety  Collateral information obtained from husband who reports patient is doing well.  Patient with history of possible ADHD however had neuropsychological testing done which ruled out ADHD.  She used to be on stimulant medications in the past which was tapered off.  She is currently doing well without it.  Patient with multiple medical problems including chronic pain which could be having an impact on her concentration although it is improving.  She continues to struggle with pain and will benefit from pain management, advised patient to continue to follow-up with her providers.  Patient relocated to [place] few months ago, will find a new provider there for management of her psychiatric medications.  She reports she was able to get information about [facility] behavioral health services in [place].  Discussed with patient  her medications refills can be provided for the next 3 months until she can get established with  another provider closer to home.  Follow-up in clinic as needed.  This note was generated in part or whole with voice recognition software. Voice recognition is usually quite accurate but there are transcription errors that can and very often do occur. I apologize for any typographical errors that were not detected and corrected.        [name], MD 08/25/2020, 1:08 PM

## 2020-08-25 NOTE — Progress Notes (Signed)
Virtual Visit via Video Note  I connected with Bailey Flores on 08/25/20 at  2:00 PM EDT by a video enabled telemedicine application and verified that I am speaking with the correct person using two identifiers.  Participating Parties Patient Provider  Location: Patient: Home Provider: Home Office   I discussed the limitations of evaluation and management by telemedicine and the availability of in person appointments. The patient expressed understanding and agreed to proceed.  THERAPY PROGRESS NOTE  Session Time: 73 Minutes  Participation Level: Active  Behavioral Response: CasualAlertEuthymic  Type of Therapy: Individual Therapy  Treatment Goals addressed: Anxiety and Coping  Interventions: CBT  Summary: Bailey Flores is a 60 y.o. female who presents with minimal anxiety and depression sxs. Pt reported "I stopped the Effexor" due to hunger cravings and headaches. "I am doing okay living without it". Pt reported that she has been spending more time engaging in hobbies such as "planting my garden" full of vegetables and flowers. Pt is also planning to engage in more arts and crafts by collecting bottles and caps. Pt reported her son has temporarily moved back home while looking for his own new place. Pt reported this has been a positive experience and that her son is very helpful around the house.  Suicidal/Homicidal: No  Therapist Response: Therapist met with patient for follow up. Therapist and patient explored increase in motivation and engagement in hobbies. Therapist informed patient regarding initial phase of terminating services with this therapist due to leaving the practice in the next 3 weeks. Therapist and patient discussed options for continued care.  Plan: Return again in 1 week.  Diagnosis: Axis I: Bipolar, Depressed, Generalized Anxiety Disorder and Memory Problem    Axis II: N/A  [name], LCSW, LCAS 08/25/2020

## 2020-09-02 ENCOUNTER — Other Ambulatory Visit: Payer: Self-pay

## 2020-09-02 ENCOUNTER — Ambulatory Visit (INDEPENDENT_AMBULATORY_CARE_PROVIDER_SITE_OTHER): Admitting: Licensed Clinical Social Worker

## 2020-09-02 ENCOUNTER — Encounter: Payer: Self-pay | Admitting: Licensed Clinical Social Worker

## 2020-09-02 DIAGNOSIS — R413 Other amnesia: Secondary | ICD-10-CM | POA: Diagnosis not present

## 2020-09-02 DIAGNOSIS — F411 Generalized anxiety disorder: Secondary | ICD-10-CM | POA: Diagnosis not present

## 2020-09-02 DIAGNOSIS — F3176 Bipolar disorder, in full remission, most recent episode depressed: Secondary | ICD-10-CM

## 2020-09-02 NOTE — Progress Notes (Signed)
Virtual Visit via Video Note  I connected with Bailey Flores on 09/02/20 at  8:00 AM EDT by a video enabled telemedicine application and verified that I am speaking with the correct person using two identifiers.  Participating Parties Patient Provider  Location: Patient: Home Provider: Home Office   I discussed the limitations of evaluation and management by telemedicine and the availability of in person appointments. The patient expressed understanding and agreed to proceed.  THERAPY PROGRESS NOTE  Session Time: 6 Minutes  Participation Level: Active  Behavioral Response: CasualAlertDepressed  Type of Therapy: Individual Therapy  Treatment Goals addressed: Coping  Interventions: CBT  Summary: Bailey Flores is a 60 y.o. female who presents with minimal anxiety and depression sxs. Pt reported "sometimes I feel like I am losing a sense of importance and I am just taking up space". Pt explained that due to her comorbid medical and mental health conditions she is unable to work and has trouble getting things around the house done consistently and in a timely manner. Pt reported she gets "very frustrated" at this. Pt reported she has always thought of herself and took pride in "being smart". Pt reflected on her hx of mental health issues and acknowledged "I haven't had any panic attacks in over a year". Pt reported that she continues to engage in hobbies that bring a sense of joy including planting her flowers and vegetables. Pt is also very connected with her adult children and supports her daughter with her own mental health issues, sharing her philosophies and advice, but "mostly I just try to listen and understand". Pt reported at times when both she and spouse are in physical pain "he can get cranky and miserable and I mirror that". Pt reported "we can't stay mad at each other" and identified what she loves about him "he is by my side no matter what". Pt reflected on  her experiences and insights. Pt to follow up for last session in one week.  Suicidal/Homicidal: No  Therapist Response: Therapist met with patient for follow up. Therapist and patient processed thoughts, feelings and reactions to current sxs. Therapist validated patient feelings/concerns and provided feedback. Pt was receptive.   Plan: Return again in 1 week.  Diagnosis: Axis I: Bipolar, Depressed, Generalized Anxiety Disorder and Memory Problem    Axis II: N/A  [name], LCSW, LCAS 09/02/2020

## 2020-09-10 ENCOUNTER — Ambulatory Visit (INDEPENDENT_AMBULATORY_CARE_PROVIDER_SITE_OTHER): Admitting: Licensed Clinical Social Worker

## 2020-09-10 ENCOUNTER — Encounter: Payer: Self-pay | Admitting: Licensed Clinical Social Worker

## 2020-09-10 ENCOUNTER — Other Ambulatory Visit: Payer: Self-pay

## 2020-09-10 DIAGNOSIS — F3176 Bipolar disorder, in full remission, most recent episode depressed: Secondary | ICD-10-CM | POA: Diagnosis not present

## 2020-09-10 DIAGNOSIS — R413 Other amnesia: Secondary | ICD-10-CM

## 2020-09-10 DIAGNOSIS — F411 Generalized anxiety disorder: Secondary | ICD-10-CM

## 2020-09-10 NOTE — Progress Notes (Signed)
Virtual Visit via Video Note  I connected with Shalona Harbour Luevanos on 09/10/20 at 11:00 AM EDT by a video enabled telemedicine application and verified that I am speaking with the correct person using two identifiers.  Participating Parties Patient Provider  Location: Patient: Home Provider: Home Office   I discussed the limitations of evaluation and management by telemedicine and the availability of in person appointments. The patient expressed understanding and agreed to proceed.  THERAPY PROGRESS NOTE  Session Time: 60 Minutes  Participation Level: Active  Behavioral Response: CasualAlertEuthymic  Type of Therapy: Individual Therapy  Treatment Goals addressed: Coping  Interventions: CBT  Summary: Margart Zemanek is a 60 y.o. female who presents with minimal anxiety and depression sxs. Pt reported things have been going well since last session. Pt reported she is engaging in chores and was seen folding laundry at beginning of session. Pt reported she is also spending time enjoying her hobbies working in the garden, playing sudoku, crosswords and jigsaw puzzles. Pt reported she enjoys keeping her mind active. Pt reported she has appointments scheduled to see her doctor regarding shoulder pain, waiting for callback for neurology, and will be seeing new psychiatrist outside of [facility] because it will be closer to home. Pt reported it is difficult "to move on. I miss the people I left behind" and is grateful for the care she received by providers here.     Suicidal/Homicidal: No  Therapist Response: Therapist met with patient for follow up. Therapist and patient reviewed sxs and patient engagement in pleasant activities. Therapist and patient discussed plans for continued care.   Plan: Pt reported she is seeking continued therapy and medication management closer to home.  Diagnosis: Axis I: Bipolar, Depressed, Generalized Anxiety Disorder and Memory Problem    Axis II:  N/A  [name], LCSW, LCAS 09/10/2020
